# Patient Record
Sex: Female | Born: 1965 | Race: White | Hispanic: No | Marital: Married | Smoking: Never smoker
Health system: Southern US, Community
[De-identification: ages and names within clinical notes are randomized; demographics above are authoritative.]

## PROBLEM LIST (undated history)

## (undated) DIAGNOSIS — M51369 Other intervertebral disc degeneration, lumbar region without mention of lumbar back pain or lower extremity pain: Secondary | ICD-10-CM

## (undated) DIAGNOSIS — D649 Anemia, unspecified: Secondary | ICD-10-CM

## (undated) DIAGNOSIS — F32A Depression, unspecified: Secondary | ICD-10-CM

## (undated) DIAGNOSIS — Z9289 Personal history of other medical treatment: Secondary | ICD-10-CM

## (undated) DIAGNOSIS — C541 Malignant neoplasm of endometrium: Secondary | ICD-10-CM

## (undated) DIAGNOSIS — E785 Hyperlipidemia, unspecified: Secondary | ICD-10-CM

## (undated) DIAGNOSIS — M5136 Other intervertebral disc degeneration, lumbar region: Secondary | ICD-10-CM

## (undated) DIAGNOSIS — E559 Vitamin D deficiency, unspecified: Secondary | ICD-10-CM

## (undated) DIAGNOSIS — F419 Anxiety disorder, unspecified: Secondary | ICD-10-CM

## (undated) DIAGNOSIS — F329 Major depressive disorder, single episode, unspecified: Secondary | ICD-10-CM

## (undated) DIAGNOSIS — N6009 Solitary cyst of unspecified breast: Secondary | ICD-10-CM

## (undated) HISTORY — DX: Hyperlipidemia, unspecified: E78.5

## (undated) HISTORY — DX: Other intervertebral disc degeneration, lumbar region: M51.36

## (undated) HISTORY — DX: Malignant neoplasm of endometrium: C54.1

## (undated) HISTORY — DX: Other intervertebral disc degeneration, lumbar region without mention of lumbar back pain or lower extremity pain: M51.369

## (undated) HISTORY — PX: BACK SURGERY: SHX140

## (undated) HISTORY — DX: Personal history of other medical treatment: Z92.89

## (undated) HISTORY — DX: Solitary cyst of unspecified breast: N60.09

## (undated) HISTORY — DX: Vitamin D deficiency, unspecified: E55.9

## (undated) HISTORY — DX: Major depressive disorder, single episode, unspecified: F32.9

## (undated) HISTORY — DX: Depression, unspecified: F32.A

## (undated) HISTORY — PX: TOTAL VAGINAL HYSTERECTOMY: SHX2548

## (undated) HISTORY — DX: Anxiety disorder, unspecified: F41.9

---

## 2005-04-07 ENCOUNTER — Ambulatory Visit: Payer: Self-pay

## 2008-02-12 ENCOUNTER — Ambulatory Visit: Payer: Self-pay | Admitting: Family Medicine

## 2009-11-14 ENCOUNTER — Ambulatory Visit: Payer: Self-pay | Admitting: Family Medicine

## 2010-01-20 ENCOUNTER — Emergency Department: Payer: Self-pay | Admitting: Emergency Medicine

## 2010-01-22 ENCOUNTER — Ambulatory Visit: Payer: Self-pay | Admitting: Urology

## 2010-02-06 ENCOUNTER — Ambulatory Visit: Payer: Self-pay | Admitting: Family Medicine

## 2010-09-09 ENCOUNTER — Ambulatory Visit: Payer: Self-pay | Admitting: Family Medicine

## 2011-08-03 ENCOUNTER — Emergency Department: Payer: Self-pay | Admitting: Emergency Medicine

## 2011-08-05 ENCOUNTER — Ambulatory Visit: Payer: Self-pay | Admitting: Physician Assistant

## 2011-11-04 DIAGNOSIS — M5417 Radiculopathy, lumbosacral region: Secondary | ICD-10-CM | POA: Insufficient documentation

## 2012-08-15 DIAGNOSIS — Z9289 Personal history of other medical treatment: Secondary | ICD-10-CM

## 2012-08-15 HISTORY — DX: Personal history of other medical treatment: Z92.89

## 2012-08-25 LAB — HM PAP SMEAR

## 2012-09-13 ENCOUNTER — Ambulatory Visit (INDEPENDENT_AMBULATORY_CARE_PROVIDER_SITE_OTHER): Payer: BC Managed Care – PPO | Admitting: Psychology

## 2012-09-13 DIAGNOSIS — F4323 Adjustment disorder with mixed anxiety and depressed mood: Secondary | ICD-10-CM

## 2012-09-14 ENCOUNTER — Encounter: Payer: Self-pay | Admitting: Obstetrics & Gynecology

## 2012-09-14 DIAGNOSIS — Z01419 Encounter for gynecological examination (general) (routine) without abnormal findings: Secondary | ICD-10-CM

## 2012-09-21 ENCOUNTER — Ambulatory Visit: Payer: Self-pay | Admitting: Psychology

## 2012-10-03 ENCOUNTER — Ambulatory Visit: Payer: BC Managed Care – PPO | Admitting: Psychology

## 2012-10-10 ENCOUNTER — Ambulatory Visit: Payer: BC Managed Care – PPO | Admitting: Psychology

## 2012-10-12 ENCOUNTER — Ambulatory Visit: Payer: Self-pay | Admitting: Family Medicine

## 2013-05-22 ENCOUNTER — Ambulatory Visit: Payer: Self-pay | Admitting: Gastroenterology

## 2013-05-23 ENCOUNTER — Ambulatory Visit: Payer: Self-pay | Admitting: Gastroenterology

## 2013-07-12 HISTORY — PX: BACK SURGERY: SHX140

## 2013-07-12 LAB — HM PAP SMEAR: HM PAP: NORMAL

## 2013-12-11 DIAGNOSIS — R0600 Dyspnea, unspecified: Secondary | ICD-10-CM | POA: Insufficient documentation

## 2013-12-11 DIAGNOSIS — Z01810 Encounter for preprocedural cardiovascular examination: Secondary | ICD-10-CM | POA: Insufficient documentation

## 2013-12-11 DIAGNOSIS — R0602 Shortness of breath: Secondary | ICD-10-CM | POA: Insufficient documentation

## 2013-12-14 ENCOUNTER — Ambulatory Visit: Payer: Self-pay | Admitting: Specialist

## 2014-03-12 HISTORY — PX: GASTRIC BYPASS: SHX52

## 2014-04-26 ENCOUNTER — Emergency Department: Payer: Self-pay | Admitting: Emergency Medicine

## 2014-04-26 LAB — COMPREHENSIVE METABOLIC PANEL
ALK PHOS: 81 U/L
Albumin: 3.8 g/dL (ref 3.4–5.0)
Anion Gap: 8 (ref 7–16)
BUN: 17 mg/dL (ref 7–18)
Bilirubin,Total: 0.4 mg/dL (ref 0.2–1.0)
CO2: 24 mmol/L (ref 21–32)
Calcium, Total: 8.8 mg/dL (ref 8.5–10.1)
Chloride: 108 mmol/L — ABNORMAL HIGH (ref 98–107)
Creatinine: 1.05 mg/dL (ref 0.60–1.30)
EGFR (African American): >60
GFR CALC NON AF AMER: 60 — AB
GLUCOSE: 96 mg/dL (ref 65–99)
OSMOLALITY: 281 (ref 275–301)
POTASSIUM: 3.6 mmol/L (ref 3.5–5.1)
SGOT(AST): 27 U/L (ref 15–37)
SGPT (ALT): 31 U/L
Sodium: 140 mmol/L (ref 136–145)
Total Protein: 8.2 g/dL (ref 6.4–8.2)

## 2014-04-26 LAB — CBC
HCT: 43.3 % (ref 35.0–47.0)
HGB: 13.4 g/dL (ref 12.0–16.0)
MCH: 25.4 pg — AB (ref 26.0–34.0)
MCHC: 30.8 g/dL — AB (ref 32.0–36.0)
MCV: 82 fL (ref 80–100)
Platelet: 265 10*3/uL (ref 150–440)
RBC: 5.26 10*6/uL — AB (ref 3.80–5.20)
RDW: 16.1 % — AB (ref 11.5–14.5)
WBC: 11.6 10*3/uL — ABNORMAL HIGH (ref 3.6–11.0)

## 2014-04-26 LAB — URINALYSIS, COMPLETE
Bilirubin,UR: NEGATIVE
Glucose,UR: NEGATIVE mg/dL (ref 0–75)
Nitrite: NEGATIVE
Ph: 5 (ref 4.5–8.0)
Protein: 30
SPECIFIC GRAVITY: 1.031 (ref 1.003–1.030)
Squamous Epithelial: 40

## 2014-04-26 LAB — LIPASE, BLOOD: LIPASE: 270 U/L (ref 73–393)

## 2014-04-28 ENCOUNTER — Emergency Department: Payer: Self-pay | Admitting: Emergency Medicine

## 2014-04-28 LAB — CBC
HCT: 37.2 % (ref 35.0–47.0)
HGB: 11.7 g/dL — AB (ref 12.0–16.0)
MCH: 26.1 pg (ref 26.0–34.0)
MCHC: 31.5 g/dL — AB (ref 32.0–36.0)
MCV: 83 fL (ref 80–100)
Platelet: 219 10*3/uL (ref 150–440)
RBC: 4.49 10*6/uL (ref 3.80–5.20)
RDW: 16.2 % — ABNORMAL HIGH (ref 11.5–14.5)
WBC: 10 10*3/uL (ref 3.6–11.0)

## 2014-04-28 LAB — URINALYSIS, COMPLETE
BACTERIA: NONE SEEN
Glucose,UR: NEGATIVE mg/dL (ref 0–75)
Leukocyte Esterase: NEGATIVE
Nitrite: NEGATIVE
PH: 5 (ref 4.5–8.0)
RBC,UR: 15 /HPF (ref 0–5)
SPECIFIC GRAVITY: 1.044 (ref 1.003–1.030)
Squamous Epithelial: 21
WBC UR: 5 /HPF (ref 0–5)

## 2014-04-28 LAB — LIPASE, BLOOD: Lipase: 225 U/L (ref 73–393)

## 2014-04-28 LAB — COMPREHENSIVE METABOLIC PANEL
Albumin: 3 g/dL — ABNORMAL LOW (ref 3.4–5.0)
Alkaline Phosphatase: 71 U/L
Anion Gap: 12 (ref 7–16)
BUN: 15 mg/dL (ref 7–18)
Bilirubin,Total: 0.5 mg/dL (ref 0.2–1.0)
CALCIUM: 8.3 mg/dL — AB (ref 8.5–10.1)
CO2: 23 mmol/L (ref 21–32)
Chloride: 104 mmol/L (ref 98–107)
Creatinine: 1.03 mg/dL (ref 0.60–1.30)
GLUCOSE: 96 mg/dL (ref 65–99)
OSMOLALITY: 278 (ref 275–301)
Potassium: 3.5 mmol/L (ref 3.5–5.1)
SGOT(AST): 19 U/L (ref 15–37)
SGPT (ALT): 23 U/L
SODIUM: 139 mmol/L (ref 136–145)
Total Protein: 7.2 g/dL (ref 6.4–8.2)

## 2014-04-28 LAB — URINE CULTURE

## 2014-04-29 DIAGNOSIS — N200 Calculus of kidney: Secondary | ICD-10-CM | POA: Insufficient documentation

## 2014-04-30 LAB — URINE CULTURE

## 2014-07-12 HISTORY — PX: BREAST BIOPSY: SHX20

## 2014-07-12 LAB — HM MAMMOGRAPHY: HM Mammogram: NORMAL (ref 0–4)

## 2014-09-24 LAB — HM MAMMOGRAPHY

## 2014-11-01 NOTE — Op Note (Signed)
PATIENT NAME:  Susan Strickland, Susan Strickland MR#:  [id number] DATE OF BIRTH:  25-Jan-1966  DATE OF PROCEDURE:  05/23/2013  REFERRING PHYSICIAN:  Dr. Marcie Bal Dear  PERFORMING PHYSICIAN:  Lucilla Lame, MD  PROCEDURE:  Impedance pH monitoring.  PROCEDURE IN DETAIL:  The pH catheter impedance probe was placed across the esophagus, and monitoring was commenced for 24 hours. The patient kept a log of her symptoms, and the information was then returned the next day, and the values were analyzed.    RESULTS:  [name] acid exposure of her pH showed esophageal pH at 24 episodes that lasted greater than 5 seconds with the longest episode in the upright position being 32 seconds, and the recumbent episodes lasted 18.8 seconds. The gastric pH throughout the procedure showed a total of 76.1% of the time with the pH less than 4. The composite score showed the patient to have total episodes of 25.1 with a DeMeester score of 3.1, which is below the normal threshold of less than 14.7. The reflux episode activity showed 81 reflux events in the upright position with 20 recumbent, for a total of 101, which is abnormally high at greater than 48. [name] nonacid episodes of reflux were 43 compared to 38 of acid. [name] symptom correlation for the reflux showed that she had 3 episodes of chest pain; two were acid related and one was unrelated. The patient had nausea of 2 occurrences with two being nonacid related, and she had the lump in the throat feeling 4 times; two of them acid related, one nonacid related. The reflux symptom index for impedance showed the chest pain correlated to 76% with the lump in throat 50%, but SAP showed chest pain to be 80% and the lump in her throat to be 97%. This study shows that the patient is having episodes of nonacid reflux that do correlate with her chest pain and a lump in her throat with the patient reported to be on acid suppression, but her gastric pH was below 4 for a significant  amount of time, equaling 76.1% of the gastric pH.     ____________________________ Lucilla Lame, MD dw:ms D: 05/31/2013 20:23:48 ET T: 05/31/2013 23:35:03 ET JOB#: [id number]  cc: Lucilla Lame, MD, <Dictator> Lucilla Lame MD ELECTRONICALLY SIGNED 06/05/2013 9:29

## 2014-11-01 NOTE — Op Note (Signed)
PATIENT NAME:  Susan Strickland, Susan Strickland MR#:  [id number] DATE OF BIRTH:  April 09, 1966  DATE OF PROCEDURE:  05/23/2013  REFERRING PHYSICIAN:  Dr. Marcie Bal Dear  INTERPRETING PHYSICIAN:  Dr. Lucilla Lame   PROCEDURE:  Esophageal manometry was done.   INDICATION:  Chest pain and a feeling of a lump in her throat.   SUMMARY:  [name] lower esophageal sphincter pressure was 19.6, which was within the normal limits. The range is between 10 and 45. [name] complete bolus transit was 64%, which occurred in 7 out of 11 swallows of liquid. This is lower than the prescribed 79%, and the total time for bolus transit for the completed bolus transit episodes was 11.8, which was normal. The manometry showed the patient's DCI to be average to be 1566. The CFV was 2. [name] DL was 10.4 sec. The IRP was 17, and the patient's TB in centimeters was average of 4. The swallowing of viscous liquids showed 70% complete bolus transfer of the measured 10 swallows with normal being greater than 69. [name] transit from the bolus was 12.7, which is less than 13, which is determined to be normal. The upper esophageal sphincter pressure was 116 mmHg, where normal is 30 to 118. The interpretation of this study showed the patient to have an esophageal manometry with normal upper and lower esophageal sphincter pressures and somewhat decreased complete bolus transit of 64% for liquids, but otherwise normal. There were no signs of esophageal dysmotility or hyperactivity. There were also no signs of high pressures to indicate nutcracker's esophagus/diffuse esophageal spasms or hypoactive esophagus.     ____________________________ Lucilla Lame, MD dw:ms D: 05/31/2013 20:32:43 ET T: 05/31/2013 23:47:13 ET JOB#: [id number]  cc: Lucilla Lame, MD, <Dictator> Lucilla Lame MD ELECTRONICALLY SIGNED 06/05/2013 9:30

## 2014-12-23 DIAGNOSIS — M5136 Other intervertebral disc degeneration, lumbar region: Secondary | ICD-10-CM | POA: Insufficient documentation

## 2015-10-21 ENCOUNTER — Encounter: Payer: Self-pay | Admitting: Family Medicine

## 2015-10-21 ENCOUNTER — Ambulatory Visit (INDEPENDENT_AMBULATORY_CARE_PROVIDER_SITE_OTHER): Payer: Self-pay | Admitting: Family Medicine

## 2015-10-21 VITALS — BP 119/81 | HR 91 | Temp 98.2°F | Resp 16 | Ht 60.0 in | Wt 210.0 lb

## 2015-10-21 DIAGNOSIS — E559 Vitamin D deficiency, unspecified: Secondary | ICD-10-CM

## 2015-10-21 DIAGNOSIS — F329 Major depressive disorder, single episode, unspecified: Secondary | ICD-10-CM

## 2015-10-21 DIAGNOSIS — M51369 Other intervertebral disc degeneration, lumbar region without mention of lumbar back pain or lower extremity pain: Secondary | ICD-10-CM

## 2015-10-21 DIAGNOSIS — M5136 Other intervertebral disc degeneration, lumbar region: Secondary | ICD-10-CM

## 2015-10-21 DIAGNOSIS — F32A Depression, unspecified: Secondary | ICD-10-CM

## 2015-10-21 DIAGNOSIS — F3281 Premenstrual dysphoric disorder: Secondary | ICD-10-CM | POA: Insufficient documentation

## 2015-10-21 MED ORDER — SERTRALINE HCL 50 MG PO TABS: 50.0000 mg | ORAL_TABLET | Freq: Every day | ORAL | Status: DC

## 2015-10-21 NOTE — Progress Notes (Signed)
Subjective:    Patient ID: Susan Strickland, female    DOB: 08-Apr-1966, 50 y.o.   MRN: [id number]  HPI: Susan Strickland is a 50 y.o. female presenting on 10/21/2015 for Establish Care   HPI  Pt presents to establish care today. Previous care provider was [facility].  It has been 1 year since Her last PCP visit. Records from previous provider will be requested and reviewed. Current medical problems include:  Degenerative disc disease: Previous surgery. Disc dissection. Sees [facility]. Is considering further surgery. Getting steroid injections in her back.  Dr. Darnell Level- gastric sleeve Kidney stones in past. No recent issues.  Vitamin D deficiency- taking 1000mg  once daily. Depression/Anxiety: Taking 50mg  zoloft daily. Has been on about 1 year. Feels it is working work. Previously taking 100mg .   Practices maximizes living- holistic eating plan and adjustment.    Health maintenance:  Last pap smear: 2 year ago. Normal in the past. Mammogram- 2 years ago. Mom had breast cancer diagnosed at age 48.  TDAP: Within 10 years.  No family history of colon cancer.  Still having periods- regular cycles.    Past Medical History  Diagnosis Date  . Anxiety    Past Surgical History  Procedure Laterality Date  . Gastric bypass  03/2014    gastric sleeve.   . Back surgery      Social History   Social History  . Marital Status: Married    Spouse Name: N/A  . Number of Children: N/A  . Years of Education: N/A   Occupational History  . Not on file.   Social History Main Topics  . Smoking status: Never Smoker   . Smokeless tobacco: Not on file  . Alcohol Use: No  . Drug Use: No  . Sexual Activity: Not on file   Other Topics Concern  . Not on file   Social History Narrative  . No narrative on file   Family History  Problem Relation Age of Onset  . Hypertension Father   . Kidney disease Father   . Diabetes Father   . Heart disease Father    No current outpatient  prescriptions on file prior to visit.   No current facility-administered medications on file prior to visit.    Review of Systems  Constitutional: Negative for fever and chills.  HENT: Negative.   Respiratory: Negative for cough, chest tightness and wheezing.   Cardiovascular: Negative for chest pain and leg swelling.  Gastrointestinal: Negative for nausea, vomiting, abdominal pain, diarrhea and constipation.  Endocrine: Negative.  Negative for cold intolerance, heat intolerance, polydipsia, polyphagia and polyuria.  Genitourinary: Negative for dysuria and difficulty urinating.  Musculoskeletal: Negative.   Neurological: Negative for dizziness, light-headedness and numbness.  Psychiatric/Behavioral: Negative.  Negative for suicidal ideas and behavioral problems. The patient is not nervous/anxious.    Per HPI unless specifically indicated above     Objective:    BP 119/81 mmHg  Pulse 91  Temp(Src) 98.2 F (36.8 C) (Oral)  Resp 16  Ht 5' (1.524 m)  Wt 210 lb (95.255 kg)  BMI 41.01 kg/m2  LMP 10/20/2015  Wt Readings from Last 3 Encounters:  10/21/15 210 lb (95.255 kg)    Depression screen [facility] 2/9 10/21/2015 10/21/2015  Decreased Interest 0 0  Down, Depressed, Hopeless 1 0  PHQ - 2 Score 1 0  Altered sleeping 0 -  Tired, decreased energy 0 -  Change in appetite 0 -  Feeling bad or failure about  yourself  0 -  Trouble concentrating 0 -  Moving slowly or fidgety/restless 0 -  Suicidal thoughts 0 -  PHQ-9 Score 1 -  Difficult doing work/chores Not difficult at all -     Physical Exam  Constitutional: She is oriented to person, place, and time. She appears well-developed and well-nourished.  HENT:  Head: Normocephalic and atraumatic.  Neck: Neck supple.  Cardiovascular: Normal rate, regular rhythm and normal heart sounds.  Exam reveals no gallop and no friction rub.   No murmur heard. Pulmonary/Chest: Effort normal and breath sounds normal. She has no wheezes. She  exhibits no tenderness.  Abdominal: Soft. Normal appearance and bowel sounds are normal. She exhibits no distension and no mass. There is no tenderness. There is no rebound and no guarding.  Musculoskeletal: Normal range of motion. She exhibits no edema or tenderness.  Lymphadenopathy:    She has no cervical adenopathy.  Neurological: She is alert and oriented to person, place, and time.  Skin: Skin is warm and dry.  Psychiatric: She has a normal mood and affect. Her behavior is normal. Judgment and thought content normal.   Results for orders placed or performed in visit on 10/21/15  HM MAMMOGRAPHY  Result Value Ref Range   HM Mammogram Self Reported Normal 0-4 Bi-Rad, Self Reported Normal  HM PAP SMEAR  Result Value Ref Range   HM Pap smear normal        Assessment & Plan:   Problem List Items Addressed This Visit      Musculoskeletal and Integument   Degeneration of intervertebral disc of lumbar region    Managed by [facility].         Other   Depression - Primary    Renewed Zoloft today. Pt appears to be well controlled on current medications. Recheck in 6 mos.       Relevant Medications   sertraline (ZOLOFT) 50 MG tablet   Vitamin D deficiency    Labs were checked by Dr. Darnell Level. Will request labwork and recheck Vitamin D if needed.          Meds ordered this encounter  Medications  . DISCONTD: sertraline (ZOLOFT) 50 MG tablet    Sig: Take 100 mg by mouth.  . Ascorbic Acid (VITAMIN C) 1000 MG tablet    Sig: Take by mouth.  . DISCONTD: Cyanocobalamin (B-12 PO)    Sig: Take by mouth.  . Cholecalciferol (VITAMIN D-1000 MAX ST) 1000 units tablet    Sig: Take by mouth.  . Multiple Vitamin (MULTI-VITAMINS) TABS    Sig: Take by mouth.  . sertraline (ZOLOFT) 50 MG tablet    Sig: Take 1 tablet (50 mg total) by mouth daily.    Dispense:  90 tablet    Refill:  3    Order Specific Question:  Supervising Provider    Answer:  Arlis Porta [id number]       Follow up plan: Return in about 6 months (around 04/21/2016) for depression. Marland Kitchen

## 2015-10-21 NOTE — Assessment & Plan Note (Signed)
Labs were checked by Dr. Darnell Level. Will request labwork and recheck Vitamin D if needed.

## 2015-10-21 NOTE — Patient Instructions (Addendum)
I have refilled your zoloft for you. Please keep taking as directed.

## 2015-10-21 NOTE — Assessment & Plan Note (Signed)
Renewed Zoloft today. Pt appears to be well controlled on current medications. Recheck in 6 mos.

## 2015-10-21 NOTE — Assessment & Plan Note (Signed)
Managed by [facility].

## 2016-02-20 ENCOUNTER — Ambulatory Visit (INDEPENDENT_AMBULATORY_CARE_PROVIDER_SITE_OTHER): Payer: BC Managed Care – PPO | Admitting: Family Medicine

## 2016-02-20 VITALS — BP 122/72 | HR 93 | Temp 98.0°F | Resp 16 | Ht 60.0 in | Wt 196.0 lb

## 2016-02-20 DIAGNOSIS — F418 Other specified anxiety disorders: Secondary | ICD-10-CM | POA: Diagnosis not present

## 2016-02-20 DIAGNOSIS — F32A Depression, unspecified: Secondary | ICD-10-CM

## 2016-02-20 DIAGNOSIS — F329 Major depressive disorder, single episode, unspecified: Secondary | ICD-10-CM

## 2016-02-20 MED ORDER — SERTRALINE HCL 50 MG PO TABS: 75.0000 mg | ORAL_TABLET | Freq: Every day | ORAL | 3 refills | Status: DC

## 2016-02-20 MED ORDER — LORAZEPAM 0.5 MG PO TABS: 0.5000 mg | ORAL_TABLET | Freq: Two times a day (BID) | ORAL | 0 refills | Status: DC | PRN

## 2016-02-20 NOTE — Progress Notes (Signed)
Subjective:    Patient ID: Susan Strickland, female    DOB: 02/11/1966, 50 y.o.   MRN: [id number]  HPI: Susan Strickland is a 50 y.o. female presenting on 02/20/2016 for Anxiety (getting worst)   HPI  Pt presents for anxiety. Had decreased Zoloft to 50mg  from 75 daily. She thought she was doing well. Work stressors have changed.  Having more anxiety. Possible toxic work environment. Had a run in with a coworker that has made her feel very anxious.   Past Medical History:  Diagnosis Date  . Anxiety     Current Outpatient Prescriptions on File Prior to Visit  Medication Sig  . Ascorbic Acid (VITAMIN C) 1000 MG tablet Take by mouth.  . Cholecalciferol (VITAMIN D-1000 MAX ST) 1000 units tablet Take by mouth.  . Multiple Vitamin (MULTI-VITAMINS) TABS Take by mouth.   No current facility-administered medications on file prior to visit.     Review of Systems Per HPI unless specifically indicated above Depression screen [facility] 2/9 02/20/2016 10/21/2015 10/21/2015  Decreased Interest 0 0 0  Down, Depressed, Hopeless 0 1 0  PHQ - 2 Score 0 1 0  Altered sleeping 0 0 -  Tired, decreased energy 1 0 -  Change in appetite 1 0 -  Feeling bad or failure about yourself  1 0 -  Trouble concentrating 0 0 -  Moving slowly or fidgety/restless 0 0 -  Suicidal thoughts 0 0 -  PHQ-9 Score 3 1 -  Difficult doing work/chores Somewhat difficult Not difficult at all -   GAD 7 : Generalized Anxiety Score 02/20/2016 10/21/2015  Nervous, Anxious, on Edge 1 2  Control/stop worrying 1 0  Worry too much - different things 0 0  Trouble relaxing 0 1  Restless 1 1  Easily annoyed or irritable 0 1  Afraid - awful might happen 1 0  Total GAD 7 Score 4 5  Anxiety Difficulty Somewhat difficult Not difficult at all        Objective:    BP 122/72 (BP Location: Left Arm, Patient Position: Sitting, Cuff Size: Normal)   Pulse 93   Temp 98 F (36.7 C) (Oral)   Resp 16   Ht 5' (1.524 m)   Wt 196 lb (88.9 kg)    BMI 38.28 kg/m   Wt Readings from Last 3 Encounters:  02/20/16 196 lb (88.9 kg)  10/21/15 210 lb (95.3 kg)    Physical Exam  Constitutional: She appears well-developed and well-nourished. No distress.  HENT:  Head: Normocephalic and atraumatic.  Eyes: EOM are normal. Pupils are equal, round, and reactive to light.  Neck: Normal range of motion. Neck supple.  Skin: Skin is warm and dry. No rash noted. She is not diaphoretic. No erythema.  Psychiatric: Her speech is normal and behavior is normal. Thought content normal. Her mood appears anxious. Cognition and memory are normal.  Appropriate tearful when talking about stressors.    Results for orders placed or performed in visit on 10/21/15  HM MAMMOGRAPHY  Result Value Ref Range   HM Mammogram Self Reported Normal 0-4 Bi-Rad, Self Reported Normal  HM PAP SMEAR  Result Value Ref Range   HM Pap smear normal        Assessment & Plan:   Problem List Items Addressed This Visit      Other   Depression - Primary    Will increase Zoloft back to 75mg  once daily. Recheck 4 weeks.  Relevant Medications   sertraline (ZOLOFT) 50 MG tablet   LORazepam (ATIVAN) 0.5 MG tablet    Other Visit Diagnoses    Situational anxiety       Ativan PRN for panic symptoms and severe anxiety. Discussed mediation and how to eliminate work stressors.    Relevant Medications   sertraline (ZOLOFT) 50 MG tablet   LORazepam (ATIVAN) 0.5 MG tablet      Meds ordered this encounter  Medications  . LOMAIRA 8 MG TABS    Refill:  0  . sertraline (ZOLOFT) 50 MG tablet    Sig: Take 1.5 tablets (75 mg total) by mouth daily.    Dispense:  135 tablet    Refill:  3    Order Specific Question:   Supervising Provider    Answer:   Arlis Porta [phone]  . LORazepam (ATIVAN) 0.5 MG tablet    Sig: Take 1 tablet (0.5 mg total) by mouth 2 (two) times daily as needed for anxiety.    Dispense:  30 tablet    Refill:  0    Order Specific Question:    Supervising Provider    Answer:   Arlis Porta [id number]      Follow up plan: Return in about 4 weeks (around 03/19/2016) for Anxiety/ depression. Marland Kitchen

## 2016-02-20 NOTE — Patient Instructions (Signed)
Let try increasing back to 75mg  once daily for your Zoloft. Please let me know if you feel the dose is not helping.  You can take Ativan (Lorazepam) as needed for anxiety. This medication may make you drowsy. Try taking 1/2 tablet at first and do not drive until you know how it will make you feel.

## 2016-02-20 NOTE — Assessment & Plan Note (Signed)
Will increase Zoloft back to 75mg  once daily. Recheck 4 weeks.

## 2016-06-09 ENCOUNTER — Encounter: Payer: Self-pay | Admitting: Family Medicine

## 2016-06-09 ENCOUNTER — Ambulatory Visit (INDEPENDENT_AMBULATORY_CARE_PROVIDER_SITE_OTHER): Payer: BC Managed Care – PPO | Admitting: Family Medicine

## 2016-06-09 VITALS — BP 106/62 | HR 80 | Temp 97.3°F | Resp 16 | Ht 60.0 in | Wt 193.0 lb

## 2016-06-09 DIAGNOSIS — M546 Pain in thoracic spine: Secondary | ICD-10-CM | POA: Diagnosis not present

## 2016-06-09 MED ORDER — BACLOFEN 10 MG PO TABS: 5.0000 mg | ORAL_TABLET | Freq: Three times a day (TID) | ORAL | 1 refills | Status: DC | PRN

## 2016-06-09 NOTE — Progress Notes (Signed)
Subjective:    Patient ID: Susan Strickland, female    DOB: May 16, 1966, 50 y.o.   MRN: [id number]  Susan Strickland is a 50 y.o. female presenting on 06/09/2016 for Back Pain (onset 3 days getting worst)  Patient presents for a same day appointment.  HPI  Acute Thoracic BACK PAIN - Reports symptoms started about 3-4 days ago without known inciting injury but does admit to working out at core building at gym in mornings, she recently took a few weeks off on vacation and did have some pains at that time, but gradual improvement and then worse again. Today seems to be worsening. Describes pain as severe middle back pain bilaterally, tightening spasms of muscles 6-8 out of 10 with intermittent episodes lasting around 30-45 min sometimes can last longer if increased activity. Worse with lifting bending forward and turning / rotation. No pain radiating to legs. - Taking Tylenol 325mg  x 3 tabs q 6-8 hours (with some moderate relief), not taking NSAIDs, s/p gastric sleeve - Tried back brace with some relief. Worse with bra strap. Has not tried heating pad at night, ice - History of lower lumbar L5-S1 OA/DJD, with prior back surgery, today pain feels very different from her chronic low back pain - Admits difficulty sleeping due to pain last night - Denies any fevers/chills, numbness, tingling, weakness, loss of control bladder/bowel incontinence or retention, unintentional wt loss, night sweats   Social History  Substance Use Topics  . Smoking status: Never Smoker  . Smokeless tobacco: Not on file  . Alcohol use No    Review of Systems Per HPI unless specifically indicated above     Objective:    BP 106/62 (BP Location: Left Arm, Patient Position: Standing, Cuff Size: Normal)   Pulse 80   Temp 97.3 F (36.3 C) (Oral)   Resp 16   Ht 5' (1.524 m)   Wt 193 lb (87.5 kg)   BMI 37.69 kg/m   Wt Readings from Last 3 Encounters:  06/09/16 193 lb (87.5 kg)  02/20/16 196 lb (88.9 kg)    10/21/15 210 lb (95.3 kg)    Physical Exam  Constitutional: She appears well-developed and well-nourished. No distress.  Well-appearing, comfortable, cooperative  HENT:  Head: Normocephalic and atraumatic.  Mouth/Throat: Oropharynx is clear and moist.  Cardiovascular: Normal rate and intact distal pulses.   Pulmonary/Chest: Effort normal.  Musculoskeletal:  Back Inspection: Normal appearance, no spinal deformity, symmetrical. Palpation: Mild to moderate tenderness over lower thoracic spinous processes and Bilateral paraspinal muscles with significant hypertonicity/spasm. ROM: Limited forward flex active ROM due to pain and spasm, but intact extension, limited rotation L/R without discomfort Special Testing: Seated SLR negative for radicular pain bilaterally. Seated facet load test positive bilateral R>L back pain Strength: Bilateral hip flex/ext 5/5, knee flex/ext 5/5, ankle dorsiflex/plantarflex 5/5 Neurovascular: intact distal sensation to light touch  Neurological: She is alert.  Skin: Skin is warm and dry. No rash noted. She is not diaphoretic.  Nursing note and vitals reviewed.      Assessment & Plan:   Problem List Items Addressed This Visit    None    Visit Diagnoses    Acute bilateral thoracic back pain    -  Primary  Acute on chronic bilateral lower thoracic back pain without associated sciatica. Suspect likely due to muscle spasm/strain with gym core exercise training without known injury or trauma. In setting of known chronic LBP with DJD and prior L5-S1 surgery. - No red flag  symptoms. Negative SLR for radiculopathy - Inadequate conservative therapy, is improved by tylenol, rest, back brace  Plan: 1. Start muscle relaxant with Baclofen 10mg  tabs - take 5-10mg  up to TID PRN, titrate up as tolerated 2. Start anti-inflammatory regularly 1-2 weeks, take current NSAID that she has at home, no new rx 3. Start regular Tylenol dosing 1000mg  TID 1-2 weeks then PRN 4.  Encouraged use of heating pad 1-2x daily for now then PRN, may use copper back brace 5. Note for work today, she declined further time off, advised relative rest, activity modification, can walk but no upper body lifting for 2 weeks or more let pain be guide 6. RTC 1 month, re-evaluation. If not improved consider X-ray imaging given >6 weeks. Consider trial of PT for strengthening.     Relevant Medications   baclofen (LIORESAL) 10 MG tablet      Meds ordered this encounter  Medications  . baclofen (LIORESAL) 10 MG tablet    Sig: Take 0.5-1 tablets (5-10 mg total) by mouth 3 (three) times daily as needed for muscle spasms.    Dispense:  60 each    Refill:  1      Follow up plan: Return in about 4 weeks (around 07/07/2016), or if symptoms worsen or fail to improve, for back pain.  Susan Putnam, DO [facility] 06/09/2016, 9:43 AM

## 2016-06-09 NOTE — Patient Instructions (Signed)
Thank you for coming in to clinic today.  1. For your Back Pain - I think that this is due to Muscle Spasms or strain. 2. Start Baclofen (Lioresal) 10mg  tablets - cut in half for 5mg  at night for muscle relaxant - may make you sedated or sleepy (be careful driving or working on this) if tolerated you can take every 8 hours, half or whole tab 3. Recommend to start taking Tylenol Extra Strength 500mg  tabs - take 1 to 2 tabs per dose (max 1000mg ) every 6-8 hours for pain (take regularly, don't skip a dose for next 1-2 weeks), max 24 hour daily dose is 6 tablets or 3000mg . In the future you can repeat the same everyday Tylenol course for 1-2 weeks at a time. - This is safe to take with anti-inflammatory medicines (Ibuprofen, Advil, Naproxen, Aleve, Meloxicam, Mobic), whichever anti-inflammatory you are taking safely at home, this is fine to continue with the tylenol and baclofen, I do prefer regular dosing with food every day for 1-2 weeks if it does not bother your gastric sleeve or you can do as needed 4. Recommend to start using heating pad on your lower back 1-2x daily for few weeks  This pain may take weeks to months to fully resolve, but hopefully it will respond to the medicine initially. All back injuries (small or serious) are slow to heal since we use our back muscles every day. Be careful with turning, twisting, lifting, sitting / standing for prolonged periods, and avoid re-injury.  If your symptoms significantly worsen with more pain, or new symptoms with weakness in one or both legs, new or different shooting leg pains, numbness in legs or groin, loss of control or retention of urine or bowel movements, please call back for advice and you may need to go directly to the Emergency Department.  Activity modification, avoid upper body lifting exercises and running for 2 weeks then let pain be your guide if it hurts don't do it. Stick to walking and light activity for now.  Please schedule a  follow-up appointment with Dr. Parks Ranger in 2-4 weeks for back pain follow-up if not improving  If you have any other questions or concerns, please feel free to call the clinic or send a message through [place]. You may also schedule an earlier appointment if necessary.  Nobie Putnam, DO [place]

## 2016-11-29 ENCOUNTER — Encounter: Payer: Self-pay | Admitting: Nurse Practitioner

## 2016-11-29 ENCOUNTER — Ambulatory Visit (INDEPENDENT_AMBULATORY_CARE_PROVIDER_SITE_OTHER): Payer: BC Managed Care – PPO | Admitting: Nurse Practitioner

## 2016-11-29 VITALS — BP 113/81 | HR 101 | Temp 98.0°F | Ht 60.0 in | Wt 192.0 lb

## 2016-11-29 DIAGNOSIS — J301 Allergic rhinitis due to pollen: Secondary | ICD-10-CM | POA: Diagnosis not present

## 2016-11-29 DIAGNOSIS — J069 Acute upper respiratory infection, unspecified: Secondary | ICD-10-CM

## 2016-11-29 DIAGNOSIS — J22 Unspecified acute lower respiratory infection: Secondary | ICD-10-CM | POA: Diagnosis not present

## 2016-11-29 MED ORDER — LORATADINE 10 MG PO TABS: 10.0000 mg | ORAL_TABLET | Freq: Every day | ORAL | 2 refills | Status: DC

## 2016-11-29 MED ORDER — BENZONATATE 100 MG PO CAPS: 100.0000 mg | ORAL_CAPSULE | Freq: Three times a day (TID) | ORAL | 0 refills | Status: DC | PRN

## 2016-11-29 MED ORDER — DOXYCYCLINE HYCLATE 100 MG PO TABS: 100.0000 mg | ORAL_TABLET | Freq: Two times a day (BID) | ORAL | 0 refills | Status: DC

## 2016-11-29 NOTE — Progress Notes (Signed)
I have reviewed this encounter including the documentation in this note and/or discussed this patient with the provider, Cassell Smiles, AGPCNP-BC. I am certifying that I agree with the content of this note as supervising physician.  Nobie Putnam, [facility] 11/29/2016, 2:51 PM

## 2016-11-29 NOTE — Progress Notes (Signed)
Subjective:    Patient ID: Susan Strickland, female    DOB: 10-16-1965, 51 y.o.   MRN: [id number]  Susan Strickland is a 51 y.o. female presenting on 11/29/2016 for Cough (chest congestion,SOB, wheezing and Low-grade fever x 8 days)   HPI  URI Symptoms Onset 7-8 days ago.  This is second round.  Had similar symptoms 3 weeks ago, got better x 1 week, then worsened again and is not improving. - Symptoms include shortness of breath, wheezing, fatigue, cough with phlegm, headache, sinus pressure, possible fever (not measured), rhinorrhea, pnd, and decreased appetite.  She notes a "Bad, deep cough" that is causingsome stress incontinence.  3 pads/day.  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 - Denies ear pressure/pain/fullness, jaw pain. - Taking delsym and Mucinex-D behind the counter.    She has had sick contacts at school.  Social History  Substance Use Topics  . Smoking status: Never Smoker  . Smokeless tobacco: Never Used  . Alcohol use No    Review of Systems Per HPI unless specifically indicated above     Objective:    BP 113/81   Pulse (!) 101   Temp 98 F (36.7 C) (Oral)   Ht 5' (1.524 m)   Wt 192 lb (87.1 kg)   SpO2 97%   BMI 37.50 kg/m    Wt Readings from Last 3 Encounters:  11/29/16 192 lb (87.1 kg)  06/09/16 193 lb (87.5 kg)  02/20/16 196 lb (88.9 kg)    Physical Exam  Constitutional: She is oriented to person, place, and time. She appears well-developed and well-nourished. No distress.  HENT:  Head: Normocephalic and atraumatic.  Right Ear: Hearing, tympanic membrane, external ear and ear canal normal.  Left Ear: Hearing, tympanic membrane, external ear and ear canal normal.  Nose: Mucosal edema and rhinorrhea present. Right sinus exhibits maxillary sinus tenderness and frontal sinus tenderness. Left sinus exhibits maxillary sinus tenderness and frontal sinus tenderness.  Mouth/Throat: Uvula is  midline and mucous membranes are normal. Oropharyngeal exudate and posterior oropharyngeal edema present. No posterior oropharyngeal erythema or tonsillar abscesses.  cobblestoning  Eyes: Conjunctivae are normal. Pupils are equal, round, and reactive to light.  Neck: Normal range of motion. Neck supple. No JVD present. No tracheal deviation present.  Cardiovascular: Normal rate, regular rhythm, normal heart sounds and intact distal pulses.   Pulmonary/Chest: Effort normal. She has no decreased breath sounds. She has wheezes in the right upper field and the right middle field. She has no rhonchi. She has no rales.      Lymphadenopathy:    She has cervical adenopathy.  Neurological: She is alert and oriented to person, place, and time.  Skin: Skin is warm and dry.  Psychiatric: She has a normal mood and affect. Her behavior is normal. Judgment and thought content normal.       Assessment & Plan:   Problem List Items Addressed This Visit    None    Visit Diagnoses    Seasonal allergic rhinitis due to pollen    -  Primary Pt with seasonal allergies.  Not taking medication.  Plan: 1. Start loratadine 10 mg once daily for management of mucous production associated with pollen allergy.   Relevant Medications   loratadine (CLARITIN) 10 MG tablet   Upper respiratory infection, acute    - Primary Acute illness. Fever responsive to NSAIDs and tylenol.  Symptoms worsening with second infectious period  in last 3 weeks. Consistent with bacterial illness x 7-8 days and focal infection of frontal and maxillary sinuses.  Plan: 1. Start doxycycline 100 mg bid x 10 days.  Take probiotic to aid gut flora. - Start anti-histamine Loratadine 10mg  daily - Continue delsym and Mucinex-D as needed. - Start tessalon perles 100 mg every 8 hours as needed for cough. 2. Supportive care with nasal saline, warm herbal tea with honey, 3. Improve hydration 4. Tylenol / Motrin PRN fevers 5. Return criteria  given. Call if not improving in 3-5 days.   Relevant Medications   pseudoephedrine-guaifenesin (MUCINEX D) 60-600 MG 12 hr tablet   benzonatate (TESSALON) 100 MG capsule   doxycycline (VIBRA-TABS) 100 MG tablet   loratadine (CLARITIN) 10 MG tablet   Unspecified acute lower respiratory infection     Probable acute lower respiratory infection as indicated by positive egophony, shortness of breath, wheezing, and deep productive cough.  Plan: 1. Discussed utility of CXR today with patient.  With possiblity of both upper and lower respiratory tract infections, antibiotic treatment would not change if imaging obtained today.  Pt decides to decline CXR. 2. If symptoms worsen or persist, obtain CXR to evaluate. 3. START doxycycline 100 mg bid x 10 days   Relevant Medications   pseudoephedrine-guaifenesin (MUCINEX D) 60-600 MG 12 hr tablet   benzonatate (TESSALON) 100 MG capsule   doxycycline (VIBRA-TABS) 100 MG tablet      Meds ordered this encounter  Medications  . pseudoephedrine-guaifenesin (MUCINEX D) 60-600 MG 12 hr tablet    Sig: Take 1 tablet by mouth every 12 (twelve) hours.  . benzonatate (TESSALON) 100 MG capsule    Sig: Take 1 capsule (100 mg total) by mouth 3 (three) times daily as needed for cough.    Dispense:  30 capsule    Refill:  0    Order Specific Question:   Supervising Provider    Answer:   Olin Hauser [[id number]]  . doxycycline (VIBRA-TABS) 100 MG tablet    Sig: Take 1 tablet (100 mg total) by mouth 2 (two) times daily.    Dispense:  20 tablet    Refill:  0    Order Specific Question:   Supervising Provider    Answer:   Olin Hauser [[id number]]  . loratadine (CLARITIN) 10 MG tablet    Sig: Take 1 tablet (10 mg total) by mouth daily.    Dispense:  30 tablet    Refill:  2    Order Specific Question:   Supervising Provider    Answer:   Olin Hauser [[id number]]      Follow up plan: Return 3-5 days if symptoms worsen or fail to  improve.   Cassell Smiles, DNP, AGPCNP-BC Adult Gerontology Primary Care Nurse Practitioner [facility] 11/29/2016, 8:58 AM

## 2016-11-29 NOTE — Patient Instructions (Addendum)
Susan Strickland, Thank you for coming in to clinic today.  1. You likely have bacterial upper respiratory infection with possible pneumonia.   - We are foregoing the X-Ray today at your request. - START taking doxycycline 100 mg twice per day (about every 12 hours) for 10 days.  Make sure to take all doses of your antibiotic. - While you are on an antibiotic, take a probiotic.  Antibiotics kill good and bad bacteria.  A probiotic helps to replace your good bacteria. Probiotic pills can be found over the counter.  One brand is Florastor, but you can use any brand you prefer.  You can also get good bacteria from foods.  These foods are yogurt. - Drink plenty of fluids.  - Start Continue anti-histamine loratadine or cetirizine 10mg  daily. - You may also use Flonase 2 sprays each nostril daily for up to 4-6 weeks  Other over the counter medications you may try, if needed for symptoms are: - If congestion is worse, start OTC Mucinex (or may try Mucinex-DM for cough) up to 7-10 days then stop - You may try over the counter Nasal Saline spray (Simply Saline, Ocean Spray) as needed to reduce congestion. - Start taking Tylenol extra strength 1 to 2 tablets every 6-8 hours for aches or fever/chills for next few days as needed.  Do not take more than 3,000 mg in 24 hours from all medicines.  You may also take ibuprofen 200-400mg  every 8 hours as needed.   - Drink warm herbal tea with honey for sore throat.   If symptoms are significantly worse with persistent fevers/chills despite tylenol/ibpurofen, nausea, vomiting unable to tolerate food/fluids or medicine, body aches, or shortness of breath, sinus pain pressure or worsening productive cough, then follow-up for re-evaluation, may seek more immediate care at Urgent Care or the ED if you are more concerned that it is an emergency.    Please schedule a follow-up appointment with Cassell Smiles, AGNP to Return 3-5 days if symptoms worsen or fail to improve.  If  you have any other questions or concerns, please feel free to call the clinic or send a message through [facility]. You may also schedule an earlier appointment if necessary.  Cassell Smiles, DNP, AGNP-BC Adult Gerontology Nurse Practitioner [facility], [facility]    Sinusitis, Adult Sinusitis is soreness and inflammation of your sinuses. Sinuses are hollow spaces in the bones around your face. Your sinuses are located:  Around your eyes.  In the middle of your forehead.  Behind your nose.  In your cheekbones. Your sinuses and nasal passages are lined with a stringy fluid (mucus). Mucus normally drains out of your sinuses. When your nasal tissues become inflamed or swollen, the mucus can become trapped or blocked so air cannot flow through your sinuses. This allows bacteria, viruses, and funguses to grow, which leads to infection. Sinusitis can develop quickly and last for 7?10 days (acute) or for more than 12 weeks (chronic). Sinusitis often develops after a cold. What are the causes? This condition is caused by anything that creates swelling in the sinuses or stops mucus from draining, including:  Allergies.  Asthma.  Bacterial or viral infection.  Abnormally shaped bones between the nasal passages.  Nasal growths that contain mucus (nasal polyps).  Narrow sinus openings.  Pollutants, such as chemicals or irritants in the air.  A foreign object stuck in the nose.  A fungal infection. This is rare. What increases the risk? The following factors may make you  more likely to develop this condition:  Having allergies or asthma.  Having had a recent cold or respiratory tract infection.  Having structural deformities or blockages in your nose or sinuses.  Having a weak immune system.  Doing a lot of swimming or diving.  Overusing nasal sprays.  Smoking. What are the signs or symptoms? The main symptoms of this condition are pain and a feeling of pressure  around the affected sinuses. Other symptoms include:  Upper toothache.  Earache.  Headache.  Bad breath.  Decreased sense of smell and taste.  A cough that may get worse at night.  Fatigue.  Fever.  Thick drainage from your nose. The drainage is often green and it may contain pus (purulent).  Stuffy nose or congestion.  Postnasal drip. This is when extra mucus collects in the throat or back of the nose.  Swelling and warmth over the affected sinuses.  Sore throat.  Sensitivity to light. How is this diagnosed? This condition is diagnosed based on symptoms, a medical history, and a physical exam. To find out if your condition is acute or chronic, your health care provider may:  Look in your nose for signs of nasal polyps.  Tap over the affected sinus to check for signs of infection.  View the inside of your sinuses using an imaging device that has a light attached (endoscope). If your health care provider suspects that you have chronic sinusitis, you may also:  Be tested for allergies.  Have a sample of mucus taken from your nose (nasal culture) and checked for bacteria.  Have a mucus sample examined to see if your sinusitis is related to an allergy. If your sinusitis does not respond to treatment and it lasts longer than 8 weeks, you may have an MRI or CT scan to check your sinuses. These scans also help to determine how severe your infection is. In rare cases, a bone biopsy may be done to rule out more serious types of fungal sinus disease. How is this treated? Treatment for sinusitis depends on the cause and whether your condition is chronic or acute. If a virus is causing your sinusitis, your symptoms will go away on their own within 10 days. You may be given medicines to relieve your symptoms, including:  Topical nasal decongestants. They shrink swollen nasal passages and let mucus drain from your sinuses.  Antihistamines. These drugs block inflammation that is  triggered by allergies. This can help to ease swelling in your nose and sinuses.  Topical nasal corticosteroids. These are nasal sprays that ease inflammation and swelling in your nose and sinuses.  Nasal saline washes. These rinses can help to get rid of thick mucus in your nose. If your condition is caused by bacteria, you will be given an antibiotic medicine. If your condition is caused by a fungus, you will be given an antifungal medicine. Surgery may be needed to correct underlying conditions, such as narrow nasal passages. Surgery may also be needed to remove polyps. Follow these instructions at home: Medicines   Take, use, or apply over-the-counter and prescription medicines only as told by your health care provider. These may include nasal sprays.  If you were prescribed an antibiotic medicine, take it as told by your health care provider. Do not stop taking the antibiotic even if you start to feel better. Hydrate and Humidify   Drink enough water to keep your urine clear or pale yellow. Staying hydrated will help to thin your mucus.  Use a  cool mist humidifier to keep the humidity level in your home above 50%.  Inhale steam for 10-15 minutes, 3-4 times a day or as told by your health care provider. You can do this in the bathroom while a hot shower is running.  Limit your exposure to cool or dry air. Rest   Rest as much as possible.  Sleep with your head raised (elevated).  Make sure to get enough sleep each night. General instructions   Apply a warm, moist washcloth to your face 3-4 times a day or as told by your health care provider. This will help with discomfort.  Wash your hands often with soap and water to reduce your exposure to viruses and other germs. If soap and water are not available, use hand sanitizer.  Do not smoke. Avoid being around people who are smoking (secondhand smoke).  Keep all follow-up visits as told by your health care provider. This is  important. Contact a health care provider if:  You have a fever.  Your symptoms get worse.  Your symptoms do not improve within 10 days. Get help right away if:  You have a severe headache.  You have persistent vomiting.  You have pain or swelling around your face or eyes.  You have vision problems.  You develop confusion.  Your neck is stiff.  You have trouble breathing. This information is not intended to replace advice given to you by your health care provider. Make sure you discuss any questions you have with your health care provider. Document Released: 06/28/2005 Document Revised: 02/22/2016 Document Reviewed: 04/23/2015 Elsevier Interactive Patient Education  2017 [employer].

## 2016-12-03 ENCOUNTER — Encounter: Payer: Self-pay | Admitting: Nurse Practitioner

## 2016-12-03 ENCOUNTER — Other Ambulatory Visit: Payer: Self-pay | Admitting: Nurse Practitioner

## 2016-12-03 DIAGNOSIS — J069 Acute upper respiratory infection, unspecified: Secondary | ICD-10-CM

## 2016-12-03 DIAGNOSIS — J22 Unspecified acute lower respiratory infection: Secondary | ICD-10-CM

## 2016-12-03 MED ORDER — AMOXICILLIN-POT CLAVULANATE 875-125 MG PO TABS: 1.0000 | ORAL_TABLET | Freq: Two times a day (BID) | ORAL | 0 refills | Status: DC

## 2016-12-21 ENCOUNTER — Ambulatory Visit (INDEPENDENT_AMBULATORY_CARE_PROVIDER_SITE_OTHER): Payer: BC Managed Care – PPO | Admitting: Obstetrics and Gynecology

## 2016-12-21 ENCOUNTER — Encounter: Payer: Self-pay | Admitting: Obstetrics and Gynecology

## 2016-12-21 VITALS — BP 126/78 | Ht 60.0 in | Wt 195.0 lb

## 2016-12-21 DIAGNOSIS — Z01419 Encounter for gynecological examination (general) (routine) without abnormal findings: Secondary | ICD-10-CM | POA: Diagnosis not present

## 2016-12-21 DIAGNOSIS — Z1151 Encounter for screening for human papillomavirus (HPV): Secondary | ICD-10-CM

## 2016-12-21 DIAGNOSIS — Z124 Encounter for screening for malignant neoplasm of cervix: Secondary | ICD-10-CM | POA: Diagnosis not present

## 2016-12-21 DIAGNOSIS — Z1231 Encounter for screening mammogram for malignant neoplasm of breast: Secondary | ICD-10-CM | POA: Diagnosis not present

## 2016-12-21 DIAGNOSIS — Z1239 Encounter for other screening for malignant neoplasm of breast: Secondary | ICD-10-CM

## 2016-12-21 DIAGNOSIS — Z803 Family history of malignant neoplasm of breast: Secondary | ICD-10-CM

## 2016-12-21 NOTE — Progress Notes (Signed)
Chief Complaint  Patient presents with  . Annual Exam     HPI:      [name] is a 51 y.o. [phone] who LMP was Patient's last menstrual period was 12/13/2016., presents today for her annual examination.  Her menses are regular every 28-30 days, lasting 6 days.  Dysmenorrhea none. She does not have intermenstrual bleeding.  Sex activity: single partner, contraception - vasectomy.  Last Pap: August 15, 2012  Results were: no abnormalities /neg HPV DNA  Hx of STDs: none  Last mammogram: 09/24/14.  Results were: normal--routine follow-up in 12 months There is a FH of breast cancer in her mom, genetic testing not done. Pt doesn't meet current insurance guidelines re: cancer genetic testing. There is no FH of ovarian cancer. The patient does do self-breast exams.  Tobacco use: The patient denies current or previous tobacco use. Alcohol use: none Exercise: moderately active  She does get adequate calcium and Vitamin D in her diet.   Past Medical History:  Diagnosis Date  . Anxiety   . Breast cyst   . Degenerative disc disease, lumbar   . Depression   . History of mammogram 05/2012; 09/24/2014   wnl per pt; benign  . History of Papanicolaou smear of cervix 08/15/2012   -/-  . Hyperlipemia   . Vitamin D deficiency     Past Surgical History:  Procedure Laterality Date  . BACK SURGERY    . 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     Family History  Problem Relation Age of Onset  . Breast cancer Mother 31  . Hypertension Father   . Kidney disease Father   . Diabetes Father        type 2  . Heart disease Father     Social History   Social History  . Marital status: Married    Spouse name: N/A  . Number of children: N/A  . Years of education: 70   Occupational History  . teacher    Social History Main Topics  . Smoking status: Never Smoker  . Smokeless tobacco: Never Used  . Alcohol use No  . Drug use: No  . Sexual activity: Yes    Birth  control/ protection: Surgical, Other-see comments     Comment: vasectomy   Other Topics Concern  . Not on file   Social History Narrative  . No narrative on file     Current Outpatient Prescriptions:  .  Ascorbic Acid (VITAMIN C) 1000 MG tablet, Take by mouth., Disp: , Rfl:  .  Cholecalciferol (VITAMIN D-1000 MAX ST) 1000 units tablet, Take by mouth., Disp: , Rfl:  .  Multiple Vitamin (MULTI-VITAMINS) TABS, Take by mouth., Disp: , Rfl:  .  sertraline (ZOLOFT) 50 MG tablet, Take 1.5 tablets (75 mg total) by mouth daily., Disp: 135 tablet, Rfl: 3 .  amoxicillin-clavulanate (AUGMENTIN) 875-125 MG tablet, Take 1 tablet by mouth 2 (two) times daily. (Patient not taking: Reported on 12/21/2016), Disp: 20 tablet, Rfl: 0 .  baclofen (LIORESAL) 10 MG tablet, Take 0.5-1 tablets (5-10 mg total) by mouth 3 (three) times daily as needed for muscle spasms. (Patient not taking: Reported on 11/29/2016), Disp: 60 each, Rfl: 1 .  benzonatate (TESSALON) 100 MG capsule, Take 1 capsule (100 mg total) by mouth 3 (three) times daily as needed for cough. (Patient not taking: Reported on 12/21/2016), Disp: 30 capsule, Rfl: 0 .  LOMAIRA 8 MG TABS, , Disp: , Rfl: 0 .  loratadine (CLARITIN) 10 MG tablet, Take 1 tablet (10 mg total) by mouth daily. (Patient not taking: Reported on 12/21/2016), Disp: 30 tablet, Rfl: 2 .  pseudoephedrine-guaifenesin (MUCINEX D) 60-600 MG 12 hr tablet, Take 1 tablet by mouth every 12 (twelve) hours., Disp: , Rfl:   ROS:  Review of Systems  Constitutional: Negative for fatigue, fever and unexpected weight change.  Respiratory: Negative for cough, shortness of breath and wheezing.   Cardiovascular: Negative for chest pain, palpitations and leg swelling.  Gastrointestinal: Negative for blood in stool, constipation, diarrhea, nausea and vomiting.  Endocrine: Negative for cold intolerance, heat intolerance and polyuria.  Genitourinary: Negative for dyspareunia, dysuria, flank pain, frequency,  genital sores, hematuria, menstrual problem, pelvic pain, urgency, vaginal bleeding, vaginal discharge and vaginal pain.  Musculoskeletal: Negative for back pain, joint swelling and myalgias.  Skin: Negative for rash.  Neurological: Negative for dizziness, syncope, light-headedness, numbness and headaches.  Hematological: Negative for adenopathy.  Psychiatric/Behavioral: Negative for agitation, confusion, sleep disturbance and suicidal ideas. The patient is not nervous/anxious.      Objective: BP 126/78   Ht 5' (1.524 m)   Wt 195 lb (88.5 kg)   LMP 12/13/2016   BMI 38.08 kg/m    Physical Exam  Constitutional: She is oriented to person, place, and time. She appears well-developed and well-nourished.  Genitourinary: Vagina normal and uterus normal. There is no rash or tenderness on the right labia. There is no rash or tenderness on the left labia. No erythema or tenderness in the vagina. No vaginal discharge found. Right adnexum does not display mass and does not display tenderness. Left adnexum does not display mass and does not display tenderness. Cervix does not exhibit motion tenderness or polyp. Uterus is not enlarged or tender.  Neck: Normal range of motion. No thyromegaly present.  Cardiovascular: Normal rate, regular rhythm and normal heart sounds.   No murmur heard. Pulmonary/Chest: Effort normal and breath sounds normal. Right breast exhibits no mass, no nipple discharge, no skin change and no tenderness. Left breast exhibits no mass, no nipple discharge, no skin change and no tenderness.  Abdominal: Soft. There is no tenderness. There is no guarding.  Musculoskeletal: Normal range of motion.  Neurological: She is alert and oriented to person, place, and time. No cranial nerve deficit.  Psychiatric: She has a normal mood and affect. Her behavior is normal.  Vitals reviewed.   Assessment/Plan: Encounter for annual routine gynecological examination  Cervical cancer screening  - Plan: IGP, Aptima HPV  Screening for HPV (human papillomavirus) - Plan: IGP, Aptima HPV  Screening for breast cancer - Pt to sched mammo. - Plan: MM SCREENING BREAST TOMO BILATERAL  Family history of breast cancer - Pt doesn't meet insurance criteria for cancer genetic testing currently, but will cont to follow for ins guideline changes. - Plan: MM SCREENING BREAST TOMO BILATERAL             GYN counsel breast self exam, mammography screening, adequate intake of calcium and vitamin D     F/U  Return in about 1 year (around 12/21/2017).  Rayshon Albaugh B. Kathleena Freeman, PA-C 12/21/2016 5:15 PM

## 2016-12-25 LAB — IGP, APTIMA HPV
HPV Aptima: NEGATIVE
PAP Smear Comment: 0

## 2016-12-27 ENCOUNTER — Ambulatory Visit
Admission: RE | Admit: 2016-12-27 | Discharge: 2016-12-27 | Disposition: A | Discharge status: Home / Self Care | Payer: BC Managed Care – PPO | Source: Ambulatory Visit | Attending: Obstetrics and Gynecology | Admitting: Obstetrics and Gynecology

## 2016-12-27 DIAGNOSIS — Z1231 Encounter for screening mammogram for malignant neoplasm of breast: Secondary | ICD-10-CM | POA: Diagnosis present

## 2016-12-27 DIAGNOSIS — Z803 Family history of malignant neoplasm of breast: Secondary | ICD-10-CM

## 2016-12-27 DIAGNOSIS — Z1239 Encounter for other screening for malignant neoplasm of breast: Secondary | ICD-10-CM

## 2016-12-28 ENCOUNTER — Other Ambulatory Visit: Payer: Self-pay | Admitting: *Deleted

## 2016-12-28 ENCOUNTER — Inpatient Hospital Stay
Admission: RE | Admit: 2016-12-28 | Discharge: 2016-12-28 | Disposition: A | Discharge status: Home / Self Care | Payer: Self-pay | Source: Ambulatory Visit | Attending: *Deleted | Admitting: *Deleted

## 2016-12-28 DIAGNOSIS — Z9289 Personal history of other medical treatment: Secondary | ICD-10-CM

## 2016-12-29 ENCOUNTER — Other Ambulatory Visit: Payer: Self-pay | Admitting: *Deleted

## 2016-12-29 ENCOUNTER — Inpatient Hospital Stay
Admission: RE | Admit: 2016-12-29 | Discharge: 2016-12-29 | Disposition: A | Discharge status: Home / Self Care | Payer: Self-pay | Source: Ambulatory Visit | Attending: *Deleted | Admitting: *Deleted

## 2016-12-29 DIAGNOSIS — Z9289 Personal history of other medical treatment: Secondary | ICD-10-CM

## 2016-12-31 ENCOUNTER — Other Ambulatory Visit: Payer: Self-pay | Admitting: Obstetrics and Gynecology

## 2016-12-31 DIAGNOSIS — R928 Other abnormal and inconclusive findings on diagnostic imaging of breast: Secondary | ICD-10-CM

## 2016-12-31 DIAGNOSIS — N6320 Unspecified lump in the left breast, unspecified quadrant: Secondary | ICD-10-CM

## 2017-01-17 ENCOUNTER — Ambulatory Visit
Admission: RE | Admit: 2017-01-17 | Discharge: 2017-01-17 | Disposition: A | Discharge status: Home / Self Care | Payer: BC Managed Care – PPO | Source: Ambulatory Visit | Attending: Obstetrics and Gynecology | Admitting: Obstetrics and Gynecology

## 2017-01-17 DIAGNOSIS — R928 Other abnormal and inconclusive findings on diagnostic imaging of breast: Secondary | ICD-10-CM

## 2017-01-17 DIAGNOSIS — N6320 Unspecified lump in the left breast, unspecified quadrant: Secondary | ICD-10-CM | POA: Insufficient documentation

## 2017-01-18 ENCOUNTER — Telehealth: Payer: Self-pay | Admitting: Obstetrics and Gynecology

## 2017-01-18 DIAGNOSIS — R928 Other abnormal and inconclusive findings on diagnostic imaging of breast: Secondary | ICD-10-CM

## 2017-01-18 NOTE — Telephone Encounter (Signed)
LMTRC

## 2017-01-18 NOTE — Telephone Encounter (Signed)
Pt aware of cat 3 mammo for LT breast nodules. Offered gen surg ref for 2nd opinion but pt ok waiting for 6 mo f/u for now. She will let me know if she changes her mind. Her mom also had breast cancer about 46, but that age is not definite. MyRisk testing discussed. Pt to try to find out age of dx and will f/u if qualifies for genetic testing.

## 2017-01-18 NOTE — Telephone Encounter (Signed)
Pt calling back to speak with Elmo Putt. Please call patient

## 2017-01-31 ENCOUNTER — Telehealth: Payer: Self-pay

## 2017-01-31 DIAGNOSIS — R928 Other abnormal and inconclusive findings on diagnostic imaging of breast: Secondary | ICD-10-CM

## 2017-01-31 NOTE — Telephone Encounter (Signed)
Left message to return call 

## 2017-01-31 NOTE — Telephone Encounter (Signed)
RN to notify pt we do gen surg ref. If she has pref, we can send there. Otherwise, I will send to Dr. Bary Castilla.

## 2017-01-31 NOTE — Telephone Encounter (Signed)
Pt states Dr Bary Castilla is fine.

## 2017-01-31 NOTE — Telephone Encounter (Signed)
Pt would like to get a second opinion regarding breast exam. Would like to know how to go about this.

## 2017-02-01 ENCOUNTER — Telehealth: Payer: Self-pay | Admitting: General Surgery

## 2017-02-01 DIAGNOSIS — R928 Other abnormal and inconclusive findings on diagnostic imaging of breast: Secondary | ICD-10-CM | POA: Insufficient documentation

## 2017-02-01 NOTE — Addendum Note (Signed)
Addended by: Ardeth Perfect B on: 5/59/7416 08:06 AM   Modules accepted: Orders

## 2017-02-01 NOTE — Telephone Encounter (Signed)
Referral submitted to Dr. Dwyane Luo office.

## 2017-02-01 NOTE — Telephone Encounter (Signed)
Susan Strickland to do gen surg ref to Dr. Bary Castilla for abn mammo.

## 2017-02-01 NOTE — Telephone Encounter (Signed)
LEFT MESSAGE FOR PATIENT TO CALL & MAKE AN APPOINTMENT WITH OUR [facility] FOR A CAT 3 MAMMO & U/S REF'D BY ALICIA COPLAND PA(DR R HARRIS) PLEASE CHECK WITH PATIENT TO SEE WHO SHE WANTS TO SEE.SHE SAW DR Jamal Collin IN 2013 & MS COLPAND REFERRED HER TO DR BYRNETT.

## 2017-02-02 ENCOUNTER — Encounter: Payer: Self-pay | Admitting: General Surgery

## 2017-02-08 ENCOUNTER — Ambulatory Visit (INDEPENDENT_AMBULATORY_CARE_PROVIDER_SITE_OTHER): Payer: BC Managed Care – PPO | Admitting: General Surgery

## 2017-02-08 ENCOUNTER — Encounter: Payer: Self-pay | Admitting: General Surgery

## 2017-02-08 VITALS — BP 112/64 | HR 80 | Resp 12 | Ht 60.0 in | Wt 197.0 lb

## 2017-02-08 DIAGNOSIS — N6324 Unspecified lump in the left breast, lower inner quadrant: Secondary | ICD-10-CM

## 2017-02-08 DIAGNOSIS — Z803 Family history of malignant neoplasm of breast: Secondary | ICD-10-CM

## 2017-02-08 DIAGNOSIS — N6322 Unspecified lump in the left breast, upper inner quadrant: Secondary | ICD-10-CM

## 2017-02-08 NOTE — Patient Instructions (Signed)
Follow up in December for left diagnostic mammogram

## 2017-02-08 NOTE — Progress Notes (Signed)
Patient ID: Susan Strickland, female   DOB: Mar 29, 1966, 51 y.o.   MRN: [id number]  Chief Complaint  Patient presents with  . Breast Problem    HPI Susan Strickland is a 51 y.o. female.  who presents for a breast evaluation. The most recent mammogram and ultrasound was done on 01-17-17.  Patient does perform regular self breast checks and gets regular mammograms done.   She feels like her left breast "feels different", a gel feeling upper lateral area. Pt has gastric bypass procedure in 2015 and states she has made some lifestyle changes. Has lost significant amount of weight in that time.  Denies any breast injury or trauma.  HPI  Past Medical History:  Diagnosis Date  . Anxiety   . Breast cyst   . Degenerative disc disease, lumbar   . Depression   . History of mammogram 05/2012; 09/24/2014   wnl per pt; benign  . History of Papanicolaou smear of cervix 08/15/2012   -/-  . Hyperlipemia   . Vitamin D deficiency     Past Surgical History:  Procedure Laterality Date  . BACK SURGERY    . BREAST BIOPSY Left 2016   NEG  . 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     Family History  Problem Relation Age of Onset  . Breast cancer Mother 58  . Hypertension Father   . Kidney disease Father   . Diabetes Father        type 2  . Heart disease Father     Social History Social History  Substance Use Topics  . Smoking status: Never Smoker  . Smokeless tobacco: Never Used  . Alcohol use No    Allergies  Allergen Reactions  . Lac Bovis Other (See Comments)    Headaches  . Doxycycline Nausea Only    Hot flashes, vivid dreams  . Milk-Related Compounds Other (See Comments)    headaches    Current Outpatient Prescriptions  Medication Sig Dispense Refill  . Ascorbic Acid (VITAMIN C) 1000 MG tablet Take by mouth.    . Cholecalciferol (VITAMIN D-1000 MAX ST) 1000 units tablet Take by mouth.    . Multiple Vitamin (MULTI-VITAMINS) TABS Take by mouth.    . sertraline (ZOLOFT)  50 MG tablet Take 1.5 tablets (75 mg total) by mouth daily. 135 tablet 3   No current facility-administered medications for this visit.     Review of Systems Review of Systems  Constitutional: Negative.   Respiratory: Negative.   Cardiovascular: Negative.     Blood pressure 112/64, pulse 80, resp. rate 12, height 5' (1.524 m), weight 197 lb (89.4 kg), last menstrual period 01/07/2017.  Physical Exam Physical Exam  Constitutional: She is oriented to person, place, and time. She appears well-developed and well-nourished.  Eyes: Conjunctivae are normal. No scleral icterus.  Neck: Neck supple.  Cardiovascular: Normal rate, regular rhythm and normal heart sounds.   Pulmonary/Chest: Effort normal and breath sounds normal. Right breast exhibits no inverted nipple, no mass, no nipple discharge, no skin change and no tenderness. Left breast exhibits no inverted nipple, no mass, no nipple discharge, no skin change and no tenderness. Breasts are symmetrical.    Abdominal: Soft. Bowel sounds are normal. There is no hepatomegaly. There is no tenderness. No hernia.  Lymphadenopathy:    She has no cervical adenopathy.    She has no axillary adenopathy.  Neurological: She is alert and oriented to person, place, and time.  Skin: Skin is warm  and dry.    Data Reviewed Recent mammograms  2 tiny nodules were identified in the left breast one in the upper inner quadrant and one in the lower inner quadrant. Ultrasound revealed 4-5 mm nodules both appearing benign. Six-month follow-up was recommended Assessment   Likely benign nodules the left breast. Agree with the short-term follow-up  Family history of breast cancer     Plan  Follow up in December with left diagnostic mammogram       HPI, Physical Exam, Assessment and Plan have been scribed under the direction and in the presence of Mckinley Jewel, MD Karie Fetch, RN  I have completed the exam and reviewed the above documentation for  accuracy and completeness.  I agree with the above.  Haematologist has been used and any errors in dictation or transcription are unintentional.  Rafiel Mecca G. Jamal Collin, M.D., F.A.C.S.  Junie Panning G 02/10/2017, 8:27 AM

## 2017-03-18 ENCOUNTER — Other Ambulatory Visit: Payer: Self-pay

## 2017-03-18 DIAGNOSIS — F329 Major depressive disorder, single episode, unspecified: Secondary | ICD-10-CM

## 2017-03-18 DIAGNOSIS — F32A Depression, unspecified: Secondary | ICD-10-CM

## 2017-03-18 MED ORDER — SERTRALINE HCL 50 MG PO TABS: 75.0000 mg | ORAL_TABLET | Freq: Every day | ORAL | 0 refills | Status: DC

## 2017-03-18 NOTE — Telephone Encounter (Signed)
Refill sent Sertraline 75mg  (1.5 tabs of 50mg ) #45 pills 0 refills, for 1 month supply  Follow-up within 1 month for extended refills.  Susan Strickland, [facility] 03/18/2017, 5:17 PM

## 2017-03-18 NOTE — Telephone Encounter (Signed)
Patient is calling requesting a refill of sertraline.  I told patient she would probley need an appointment.  She is out of meds and requesting 1 refill.

## 2017-03-21 NOTE — Telephone Encounter (Signed)
Left detailed message.   

## 2017-04-25 ENCOUNTER — Encounter: Payer: Self-pay | Admitting: Family Medicine

## 2017-04-25 ENCOUNTER — Ambulatory Visit (INDEPENDENT_AMBULATORY_CARE_PROVIDER_SITE_OTHER): Payer: BC Managed Care – PPO | Admitting: Family Medicine

## 2017-04-25 VITALS — BP 115/62 | HR 76 | Temp 98.4°F | Resp 16 | Ht 60.0 in | Wt 197.6 lb

## 2017-04-25 DIAGNOSIS — E782 Mixed hyperlipidemia: Secondary | ICD-10-CM

## 2017-04-25 DIAGNOSIS — E785 Hyperlipidemia, unspecified: Secondary | ICD-10-CM | POA: Insufficient documentation

## 2017-04-25 DIAGNOSIS — Z114 Encounter for screening for human immunodeficiency virus [HIV]: Secondary | ICD-10-CM

## 2017-04-25 DIAGNOSIS — D649 Anemia, unspecified: Secondary | ICD-10-CM | POA: Diagnosis not present

## 2017-04-25 DIAGNOSIS — R7309 Other abnormal glucose: Secondary | ICD-10-CM

## 2017-04-25 DIAGNOSIS — R799 Abnormal finding of blood chemistry, unspecified: Secondary | ICD-10-CM

## 2017-04-25 DIAGNOSIS — Z9884 Bariatric surgery status: Secondary | ICD-10-CM | POA: Diagnosis not present

## 2017-04-25 DIAGNOSIS — E538 Deficiency of other specified B group vitamins: Secondary | ICD-10-CM

## 2017-04-25 DIAGNOSIS — E559 Vitamin D deficiency, unspecified: Secondary | ICD-10-CM | POA: Diagnosis not present

## 2017-04-25 DIAGNOSIS — F3281 Premenstrual dysphoric disorder: Secondary | ICD-10-CM | POA: Diagnosis not present

## 2017-04-25 DIAGNOSIS — E669 Obesity, unspecified: Secondary | ICD-10-CM

## 2017-04-25 MED ORDER — SERTRALINE HCL 100 MG PO TABS: 100.0000 mg | ORAL_TABLET | Freq: Every day | ORAL | 5 refills | Status: DC

## 2017-04-25 NOTE — Assessment & Plan Note (Signed)
Mild recent decline with less effectiveness of SSRI at prior 50mg  dose Most consistent with PMDD given relation to menstrual cycle, limited other triggers  Plan: 1. Agree dose increase Sertraline from 50mg  to 100mg  daily - she has been on 75mg  to 100mg  intermittently with limited relief, but needs stable dose - New rx Sertraline 100mg  tabs, take one daily as instructed - Future plans may consider alternative SSRI or adjunct therapy if needed pending follow-up response

## 2017-04-25 NOTE — Assessment & Plan Note (Signed)
S/p Gastric Sleeve 2015, overall wt has remained stable in past several months, some mild wt gain - No longer followed by bariatrics - Due for annual physical and blood work, will check all labs as requested including vitamins with prior nutritional deficiency

## 2017-04-25 NOTE — Progress Notes (Signed)
Subjective:    Patient ID: Susan Strickland, female    DOB: Jun 30, 1966, 51 y.o.   MRN: [id number]  Susan Strickland is a 51 y.o. 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 Prior dx >4-5 years ago, she was tried on one SSRI initially but it was ineffective. Has been on Sertraline (Zoloft) 50mg  daily for 4 years now with improvement, within past 12 months she was advised to take 1.5 tabs for 75mg  daily, but only doing this PRN it seems, limited relief, today asking about dose increase - Denies any major life stressors or anxiety or stress affecting her symptoms - Denies any suicidal or homicidal ideation  S/p Gastric Sleeve (2015) / History of Vitamin D and B12 deficiency - S/p Gastric Sleeve done 3 years ago due to obesity, has done fairly well overall, she followed up for labs for 3 years then the office relocated to [facility] and she is unable to return to follow-up, asking if we can check blood work here. - No longer taking Vitamin D, or B12 - Asking about history of anemia  Additional history  - S/p Back Surgery L5-S1 microdiscectomy 2013. Currently doing well, takes tumeric daily  Health Maintenance: - Due for Flu Shot, declines today despite counseling on benefits - likely will consider at next visit in 4 weeks - Due for routine HIV screen, agree check with lab  Past Medical History:  Diagnosis Date  . Anxiety   . Breast cyst   . Degenerative disc disease, lumbar   . Depression   . History of mammogram 05/2012; 09/24/2014   wnl per pt; benign  . History of Papanicolaou smear of cervix 08/15/2012   -/-  . Hyperlipemia   . Vitamin D deficiency    Past Surgical History:    Procedure Laterality Date  . BACK SURGERY    . BREAST BIOPSY Left 2016   NEG  . 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     Depression screen [facility] 2/9 04/25/2017 02/20/2016 10/21/2015  Decreased Interest 0 0 0  Down, Depressed, Hopeless 1 0 1  PHQ - 2 Score 1 0 1  Altered sleeping 2 0 0  Tired, decreased energy 2 1 0  Change in appetite 0 1 0  Feeling bad or failure about yourself  1 1 0  Trouble concentrating 0 0 0  Moving slowly or fidgety/restless 0 0 0  Suicidal thoughts 0 0 0  PHQ-9 Score 6 3 1   Difficult doing work/chores Not difficult at all Somewhat difficult Not difficult at all    Social History  Substance Use Topics  . Smoking status: Never Smoker  . Smokeless tobacco: Never Used  . Alcohol use No    Review of Systems Per HPI unless specifically indicated above     Objective:    BP 115/62   Pulse 76   Temp 98.4 F (36.9 C) (Oral)   Resp 16   Ht 5' (1.524 m)   Wt 197 lb 9.6 oz (89.6 kg)   BMI 38.59 kg/m   Wt Readings from Last 3 Encounters:  04/25/17 197 lb 9.6 oz (89.6 kg)  02/08/17 197 lb (89.4 kg)  12/21/16 195 lb (88.5 kg)  Physical Exam  Constitutional: She is oriented to person, place, and time. She appears well-developed and well-nourished. No distress.  Well-appearing, comfortable, cooperative  HENT:  Head: Normocephalic and atraumatic.  Mouth/Throat: Oropharynx is clear and moist.  Eyes: Conjunctivae are normal. Right eye exhibits no discharge. Left eye exhibits no discharge.  Cardiovascular: Normal rate, regular rhythm, normal heart sounds and intact distal pulses.   No murmur heard. Pulmonary/Chest: Effort normal and breath sounds normal. No respiratory distress. She has no wheezes. She has no rales.  Musculoskeletal: She exhibits no edema.  Neurological: She is alert and oriented to person, place, and time.  Skin: Skin is warm and dry. No rash noted. She is not diaphoretic. No erythema.  Psychiatric: She has a normal mood and  affect. Her behavior is normal.  Well groomed, good eye contact, normal speech and thoughts  Nursing note and vitals reviewed.    Results for orders placed or performed in visit on 12/21/16  HM MAMMOGRAPHY  Result Value Ref Range   HM Mammogram 0-4 Bi-Rad 0-4 Bi-Rad, Self Reported Normal  HM PAP SMEAR  Result Value Ref Range   HM Pap smear -/-   IGP, Aptima HPV  Result Value Ref Range   DIAGNOSIS: Comment    Specimen adequacy: Comment    Clinician Provided ICD10 Comment    Performed by: Comment    PAP Smear Comment .    Note: Comment    Test Methodology Comment    HPV Aptima Negative Negative      Assessment & Plan:   Problem List Items Addressed This Visit    Hyperlipidemia   Relevant Orders   Lipid panel   Normocytic anemia   Relevant Orders   CBC with Differential/Platelet   Folate   Iron and TIBC   Ferritin   Obesity (BMI 30-39.9)   Relevant Orders   Comprehensive metabolic panel   PMDD (premenstrual dysphoric disorder) - Primary    Mild recent decline with less effectiveness of SSRI at prior 50mg  dose Most consistent with PMDD given relation to menstrual cycle, limited other triggers  Plan: 1. Agree dose increase Sertraline from 50mg  to 100mg  daily - she has been on 75mg  to 100mg  intermittently with limited relief, but needs stable dose - New rx Sertraline 100mg  tabs, take one daily as instructed - Future plans may consider alternative SSRI or adjunct therapy if needed pending follow-up response      Relevant Medications   sertraline (ZOLOFT) 100 MG tablet   Other Relevant Orders   Comprehensive metabolic panel   S/P bariatric surgery    S/p Gastric Sleeve 2015, overall wt has remained stable in past several months, some mild wt gain - No longer followed by bariatrics - Due for annual physical and blood work, will check all labs as requested including vitamins with prior nutritional deficiency      Relevant Orders   CBC with Differential/Platelet    Vitamin B12   Folate   Iron and TIBC   VITAMIN D 25 Hydroxy (Vit-D Deficiency, Fractures)   Magnesium   Vitamin B12 nutritional deficiency   Relevant Orders   CBC with Differential/Platelet   Vitamin B12   Vitamin D deficiency   Relevant Orders   VITAMIN D 25 Hydroxy (Vit-D Deficiency, Fractures)    Other Visit Diagnoses    Screening for HIV (human immunodeficiency virus)       Relevant Orders   HIV antibody   Abnormal blood chemistry       Relevant  Orders   Comprehensive metabolic panel   Magnesium   Abnormal glucose       Relevant Orders   Hemoglobin A1c      Meds ordered this encounter  Medications  . sertraline (ZOLOFT) 100 MG tablet    Sig: Take 1 tablet (100 mg total) by mouth daily.    Dispense:  30 tablet    Refill:  5    Follow up plan: Return in about 4 weeks (around 05/23/2017) for Annual Physical.  Labs ordered for [facility] by patient request, and orders printed, given to patient, advised to get drawn within 2-3 weeks then return 1 week later for annual.  Nobie Putnam, [facility] 04/25/2017, 10:54 PM

## 2017-04-25 NOTE — Patient Instructions (Addendum)
Thank you for coming to the clinic today.  1. Refilled Zoloft (Sertraline) increased dose at 100mg  daily, may take 3-6 weeks for full effect.  Will check labs and review at next apt  DUE for FASTING BLOOD WORK (no food or drink after midnight before the lab appointment, only water or coffee without cream/sugar on the morning of)  [facility] 1-4 WEEKS  For Lab Results, once available within 2-3 days of blood draw, you can can log in to MyChart online to view your results and a brief explanation. Also, we can discuss results at next follow-up visit.  Please schedule a Follow-up Appointment to: Return in about 4 weeks (around 05/23/2017) for Annual Physical.  If you have any other questions or concerns, please feel free to call the clinic or send a message through [facility]. You may also schedule an earlier appointment if necessary.  Additionally, you may be receiving a survey about your experience at our clinic within a few days to 1 week by e-mail or mail. We value your feedback.  Nobie Putnam, DO [place]

## 2017-05-12 ENCOUNTER — Telehealth: Payer: Self-pay

## 2017-05-12 NOTE — Telephone Encounter (Signed)
Patient is in recalls for December for [place] [facility] and follow up with Dr Jamal Collin. The patient is already scheduled for their mammogram in January next year. She will need to reschedule her mammogram to December in order to see Dr Jamal Collin afterwards.

## 2017-05-19 ENCOUNTER — Encounter: Payer: Self-pay | Admitting: General Surgery

## 2017-05-19 NOTE — Telephone Encounter (Signed)
I have left another message for the patient to call us back.

## 2017-06-28 ENCOUNTER — Inpatient Hospital Stay: Admission: RE | Admit: 2017-06-28 | Payer: Federal, State, Local not specified - PPO | Source: Ambulatory Visit

## 2017-06-29 ENCOUNTER — Ambulatory Visit (INDEPENDENT_AMBULATORY_CARE_PROVIDER_SITE_OTHER): Payer: BC Managed Care – PPO | Admitting: General Surgery

## 2017-06-29 ENCOUNTER — Encounter: Payer: Self-pay | Admitting: General Surgery

## 2017-06-29 VITALS — BP 130/74 | HR 80 | Resp 12 | Ht 60.0 in | Wt 199.0 lb

## 2017-06-29 DIAGNOSIS — N6324 Unspecified lump in the left breast, lower inner quadrant: Secondary | ICD-10-CM | POA: Diagnosis not present

## 2017-06-29 NOTE — Patient Instructions (Signed)
ent to return for her six month bilateral mammogram with her PCP. The patient is aware to call back for any questions or concerns.

## 2017-06-29 NOTE — Progress Notes (Signed)
Patient ID: Susan Strickland, female   DOB: Feb 25, 1966, 51 y.o.   MRN: [id number]  Chief Complaint  Patient presents with  . Follow-up    HPI Susan Strickland is a 51 y.o. female who presents for a breast evaluation. Here today following up from her six month left breast check. She had two tiny nodules 4-5 mm size located on the inner aspect of the left breast-seen on ultrasound.  These were felt to be benign 6 months ago on a six-month follow-up left breast diagnostic mammogram was recommended and this is scheduled for the beginning of the year.  Patient does perform regular self breast checks and gets regular mammograms done.    HPI  Past Medical History:  Diagnosis Date  . Anxiety   . Breast cyst   . Degenerative disc disease, lumbar   . Depression   . History of mammogram 05/2012; 09/24/2014   wnl per pt; benign  . History of Papanicolaou smear of cervix 08/15/2012   -/-  . Hyperlipemia   . Vitamin D deficiency     Past Surgical History:  Procedure Laterality Date  . BACK SURGERY    . BREAST BIOPSY Left 2016   NEG  . 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     Family History  Problem Relation Age of Onset  . Breast cancer Mother 65  . Hypertension Father   . Kidney disease Father   . Diabetes Father        type 2  . Heart disease Father     Social History Social History   Tobacco Use  . Smoking status: Never Smoker  . Smokeless tobacco: Never Used  Substance Use Topics  . Alcohol use: No  . Drug use: No    Allergies  Allergen Reactions  . Lac Bovis Other (See Comments)    Headaches  . Doxycycline Nausea Only    Hot flashes, vivid dreams  . Milk-Related Compounds Other (See Comments)    headaches    Current Outpatient Medications  Medication Sig Dispense Refill  . Ascorbic Acid (VITAMIN C) 1000 MG tablet Take by mouth.    . Cholecalciferol (VITAMIN D-1000 MAX ST) 1000 units tablet Take by mouth.    . sertraline (ZOLOFT) 100 MG tablet Take 1  tablet (100 mg total) by mouth daily. 30 tablet 5  . Multiple Vitamin (MULTI-VITAMINS) TABS Take by mouth.     No current facility-administered medications for this visit.     Review of Systems Review of Systems  Constitutional: Negative.   Respiratory: Negative.   Cardiovascular: Negative.     Blood pressure 130/74, pulse 80, resp. rate 12, height 5' (1.524 m), weight 199 lb (90.3 kg).  Physical Exam Physical Exam  Constitutional: She is oriented to person, place, and time. She appears well-developed and well-nourished.  Eyes: Conjunctivae are normal. No scleral icterus.  Neck: Neck supple.  Cardiovascular: Normal rate, regular rhythm and normal heart sounds.  Pulmonary/Chest: Effort normal and breath sounds normal.  Abdominal: Soft. Bowel sounds are normal. She exhibits no distension and no mass. There is no tenderness.  Lymphadenopathy:    She has no cervical adenopathy.    She has no axillary adenopathy.  Neurological: She is alert and oriented to person, place, and time.  Skin: Skin is warm and dry.    Data Reviewed Prior notes   Assessment Benign-appearing tiny nodules in the left breast for which follow-up mammogram and ultrasound is scheduled for early January. Exam  is stable with no findings noted on the left breast Family history of breast cancer         Plan       Patient to return for her six month bilateral mammogram with her PCP. The patient is aware to call back for any questions or concerns.   HPI, Physical Exam, Assessment and Plan have been scribed under the direction and in the presence of Mckinley Jewel, MD  Gaspar Cola, CMA I have completed the exam and reviewed the above documentation for accuracy and completeness.  I agree with the above.  Haematologist has been used and any errors in dictation or transcription are unintentional.  Jacqui Headen G. Jamal Collin, M.D., F.A.C.S.   Junie Panning G 06/30/2017, 10:37 AM

## 2017-06-30 ENCOUNTER — Other Ambulatory Visit: Payer: Federal, State, Local not specified - PPO

## 2017-07-15 ENCOUNTER — Ambulatory Visit
Admission: RE | Admit: 2017-07-15 | Discharge: 2017-07-15 | Disposition: A | Discharge status: Home / Self Care | Payer: BC Managed Care – PPO | Source: Ambulatory Visit | Attending: Obstetrics and Gynecology | Admitting: Obstetrics and Gynecology

## 2017-07-15 DIAGNOSIS — R928 Other abnormal and inconclusive findings on diagnostic imaging of breast: Secondary | ICD-10-CM

## 2017-07-21 ENCOUNTER — Other Ambulatory Visit: Payer: Federal, State, Local not specified - PPO

## 2017-10-20 ENCOUNTER — Other Ambulatory Visit: Payer: Self-pay

## 2017-10-20 DIAGNOSIS — Z1231 Encounter for screening mammogram for malignant neoplasm of breast: Secondary | ICD-10-CM

## 2017-11-24 ENCOUNTER — Ambulatory Visit (INDEPENDENT_AMBULATORY_CARE_PROVIDER_SITE_OTHER): Payer: BC Managed Care – PPO | Admitting: Family Medicine

## 2017-11-24 ENCOUNTER — Encounter: Payer: Self-pay | Admitting: Family Medicine

## 2017-11-24 ENCOUNTER — Other Ambulatory Visit: Payer: Self-pay | Admitting: Family Medicine

## 2017-11-24 VITALS — BP 118/71 | HR 83 | Temp 98.3°F | Resp 16 | Ht 60.0 in | Wt 201.4 lb

## 2017-11-24 DIAGNOSIS — S0990XA Unspecified injury of head, initial encounter: Secondary | ICD-10-CM

## 2017-11-24 DIAGNOSIS — M62838 Other muscle spasm: Secondary | ICD-10-CM

## 2017-11-24 DIAGNOSIS — S060X0A Concussion without loss of consciousness, initial encounter: Secondary | ICD-10-CM

## 2017-11-24 MED ORDER — BACLOFEN 10 MG PO TABS: 5.0000 mg | ORAL_TABLET | Freq: Three times a day (TID) | ORAL | 1 refills | Status: DC | PRN
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
## 2017-11-24 NOTE — Patient Instructions (Addendum)
Thank you for coming to the office today.  Likely concussion - uncertain duration may gradually keep improving  Recommend to start taking Tylenol Extra Strength 500mg  tabs - take 1 to 2 tabs per dose (max 1000mg ) every 6-8 hours for pain (take regularly, don't skip a dose for next 7 days), max 24 hour daily dose is 6 tablets or 3000mg . In the future you can repeat the same everyday Tylenol course for 1-2 weeks at a time.   Start taking Baclofen (Lioresal) 10mg  (muscle relaxant) - start with half (cut) to one whole pill at night as needed for next 1-3 nights (may make you drowsy, caution with driving) see how it affects you, then if tolerated increase to one pill 2 to 3 times a day or (every 8 hours as needed)  May go to Chiropractor after 1-2 weeks if needed, this can help restore head / spine mechanics to improve mood  No imaging at this time from our office - we may send you for a Head CT if not improved by 2 more weeks - to evaluate possible concussion  If acute worsening headache - worst headache of life, not improving, loss of vision, numbness tingling weakness, facial droop, lose consciousness   Please schedule a Follow-up Appointment to: Return in about 1 month (around 12/22/2017), or if symptoms worsen or fail to improve, for headaches, concussion.  If you have any other questions or concerns, please feel free to call the office or send a message through [place]. You may also schedule an earlier appointment if necessary.  Additionally, you may be receiving a survey about your experience at our office within a few days to 1 week by e-mail or mail. We value your feedback.  Nobie Putnam, DO [place]

## 2017-12-28 ENCOUNTER — Other Ambulatory Visit: Payer: Self-pay | Admitting: General Surgery

## 2017-12-28 ENCOUNTER — Ambulatory Visit
Admission: RE | Admit: 2017-12-28 | Discharge: 2017-12-28 | Disposition: A | Discharge status: Home / Self Care | Payer: BC Managed Care – PPO | Source: Ambulatory Visit | Attending: General Surgery | Admitting: General Surgery

## 2017-12-28 DIAGNOSIS — Z1231 Encounter for screening mammogram for malignant neoplasm of breast: Secondary | ICD-10-CM

## 2018-01-03 ENCOUNTER — Ambulatory Visit: Payer: BC Managed Care – PPO | Admitting: General Surgery

## 2018-02-15 ENCOUNTER — Other Ambulatory Visit: Payer: Self-pay | Admitting: Family Medicine

## 2018-02-15 DIAGNOSIS — F3281 Premenstrual dysphoric disorder: Secondary | ICD-10-CM

## 2018-02-15 MED ORDER — SERTRALINE HCL 100 MG PO TABS: 100.0000 mg | ORAL_TABLET | Freq: Every day | ORAL | 11 refills | Status: DC

## 2018-02-15 NOTE — Telephone Encounter (Signed)
Sent rx Zoloft with refills - please notify patient that this rx was sent as she requested.  She may schedule a yearly physical in November or December 2019. She can get labs done here or at [facility] - she may notify us in advance to request labs or we can do this after visit.  Nobie Putnam, [facility] 02/15/2018, 3:49 PM

## 2018-02-15 NOTE — Telephone Encounter (Signed)
The pt was notified. No questions or concerns. 

## 2018-02-15 NOTE — Telephone Encounter (Signed)
Pt called requesting a refill on  Zoloft called into [facility] for a year. Pt call back # is [phone] PT ALSO WOULD LIKE FOR SOMEONE TO CALL HER WHEN PRESCRIPTION called in

## 2018-02-24 ENCOUNTER — Encounter: Payer: Self-pay | Admitting: Family Medicine

## 2018-02-24 ENCOUNTER — Ambulatory Visit (INDEPENDENT_AMBULATORY_CARE_PROVIDER_SITE_OTHER): Payer: BC Managed Care – PPO | Admitting: Family Medicine

## 2018-02-24 VITALS — BP 119/74 | HR 79 | Temp 98.1°F | Resp 16 | Ht 60.0 in | Wt 205.0 lb

## 2018-02-24 DIAGNOSIS — Z9884 Bariatric surgery status: Secondary | ICD-10-CM

## 2018-02-24 DIAGNOSIS — R195 Other fecal abnormalities: Secondary | ICD-10-CM

## 2018-02-24 DIAGNOSIS — E860 Dehydration: Secondary | ICD-10-CM | POA: Diagnosis not present

## 2018-02-24 DIAGNOSIS — R197 Diarrhea, unspecified: Secondary | ICD-10-CM

## 2018-02-24 DIAGNOSIS — D649 Anemia, unspecified: Secondary | ICD-10-CM | POA: Diagnosis not present

## 2018-02-24 DIAGNOSIS — K909 Intestinal malabsorption, unspecified: Secondary | ICD-10-CM | POA: Diagnosis not present

## 2018-02-24 DIAGNOSIS — R110 Nausea: Secondary | ICD-10-CM

## 2018-02-24 LAB — COMPLETE METABOLIC PANEL WITH GFR
AG Ratio: 1.4 (calc) (ref 1.0–2.5)
ALKALINE PHOSPHATASE (APISO): 57 U/L (ref 33–130)
ALT: 15 U/L (ref 6–29)
AST: 20 U/L (ref 10–35)
Albumin: 3.9 g/dL (ref 3.6–5.1)
BUN: 16 mg/dL (ref 7–25)
CO2: 26 mmol/L (ref 20–32)
CREATININE: 0.79 mg/dL (ref 0.50–1.05)
Calcium: 8.9 mg/dL (ref 8.6–10.4)
Chloride: 105 mmol/L (ref 98–110)
GFR, Est African American: 101 mL/min/{1.73_m2} (ref >=60)
GFR, Est Non African American: 87 mL/min/{1.73_m2} (ref >=60)
GLOBULIN: 2.8 g/dL (ref 1.9–3.7)
Glucose, Bld: 105 mg/dL — ABNORMAL HIGH (ref 65–99)
POTASSIUM: 4.2 mmol/L (ref 3.5–5.3)
SODIUM: 139 mmol/L (ref 135–146)
Total Bilirubin: 0.3 mg/dL (ref 0.2–1.2)
Total Protein: 6.7 g/dL (ref 6.1–8.1)

## 2018-02-24 LAB — MAGNESIUM: MAGNESIUM: 1.8 mg/dL (ref 1.5–2.5)

## 2018-02-24 LAB — CBC WITH DIFFERENTIAL/PLATELET

## 2018-02-24 LAB — SPECIMEN COMPROMISED

## 2018-02-24 MED ORDER — ONDANSETRON 4 MG PO TBDP: 4.0000 mg | ORAL_TABLET | Freq: Three times a day (TID) | ORAL | 0 refills | Status: DC | PRN

## 2018-02-24 MED ORDER — DICYCLOMINE HCL 10 MG PO CAPS: 10.0000 mg | ORAL_CAPSULE | Freq: Three times a day (TID) | ORAL | 0 refills | Status: DC

## 2018-02-24 NOTE — Patient Instructions (Addendum)
Thank you for coming to the office today.  Referral placed to GI - stay tuned for apt - call them if not heard back.  [facility]) [place], [place] [address] Phone: [phone]  For short term - Try Zofran as needed for nausea - Try Dicyclomine (Bentyl) for abdominal cramping - may continue Imodium AD  Try to keep improving hydration, pedialyte or other  STAT labs sent if we get results before 5 we will notify you, otherwise on call provider may contact you, if you do not hear back with lab results you may contact the oncall provider through our answering service.  If any significant worsening diarrhea, nausea, or if vomiting, return of fevers, worsening blood in stool or dark stools, abdominal pain, or dehydration - may need to go more immediately to hospital ED for evaluation as we discussed this may need IV treatment or imaging.  Please schedule a Follow-up Appointment to: Return in about 6 weeks (around 04/07/2018) for GI follow-up.  If you have any other questions or concerns, please feel free to call the office or send a message through [place]. You may also schedule an earlier appointment if necessary.  Additionally, you may be receiving a survey about your experience at our office within a few days to 1 week by e-mail or mail. We value your feedback.  Nobie Putnam, DO [place]

## 2018-02-24 NOTE — Progress Notes (Addendum)
Subjective:    Patient ID: Susan Strickland, female    DOB: Nov 02, 1965, 52 y.o.   MRN: [id number]  Susan Strickland is a 52 y.o. female presenting on 02/24/2018 for Diarrhea (fever, nausea, can't hold food, pedialyte was helping, notice blood in stool onset month and last week was worst)   HPI   Nausea / Diarrhea / S/p Gastric Sleeve (2015) / History of Vitamin D and B12 deficiency Chronic problem with GI issues. She has had complications from her gastric sleeve surgery in past that seems to have affected her digestion and bowel habits. This had been fairly well controlled in the past. Until recently worsening. She used to do well on probiotic and supplement / vitamin shake daily, and now she thinks this is worsening her problems. - Today she reports now for past 1 month she has described diarrhea with bowel changes and abdominal cramping. She states at first was more bright yellow liquid stool and then semi formed and then looser now with some darker or "rust" colored possible blood without bright red bleeding. - She has stopped the supplement shake in AM. She has scaled back on most vitamins. - She is having difficulty taking in food poor PO intake recently. She admits feeling dehydrated recently and was not keeping in fluids, she was nauseas and could not eat many solids, but did not have vomiting. She now has been able to take Pedialyte and doing better for past 3 days. She feels somewhat better but still diarrhea. No known sick contacts with stomach bug or similar symptoms. - Tried Imodium AD OTC without any significant relief, following dosing on package - used to take Dicyclomine with good results for abdominal cramping. She is requesting nausea medication - She is asking about blood work today and she does not have a GI specialist following her. - No history of gallbladder problems or appendix. - Admits she had temperature or fever back when this first started but then it resolved, no  more fever Denies any active worsening abdominal pain, vomiting, fever, chills, sweats, dysuria, headache, dyspnea, cough  Past Surgical History:  Procedure Laterality Date  . BACK SURGERY    . BREAST BIOPSY Left 2016   NEG  . 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     Depression screen [facility] 2/9 02/24/2018 11/24/2017 04/25/2017  Decreased Interest 0 0 0  Down, Depressed, Hopeless 0 0 1  PHQ - 2 Score 0 0 1  Altered sleeping - - 2  Tired, decreased energy - - 2  Change in appetite - - 0  Feeling bad or failure about yourself  - - 1  Trouble concentrating - - 0  Moving slowly or fidgety/restless - - 0  Suicidal thoughts - - 0  PHQ-9 Score - - 6  Difficult doing work/chores - - Not difficult at all    Social History   Tobacco Use  . Smoking status: Never Smoker  . Smokeless tobacco: Never Used  Substance Use Topics  . Alcohol use: No  . Drug use: No    Review of Systems Per HPI unless specifically indicated above     Objective:    BP 119/74   Pulse 79   Temp 98.1 F (36.7 C) (Oral)   Resp 16   Ht 5' (1.524 m)   Wt 205 lb (93 kg)   BMI 40.04 kg/m   Wt Readings from Last 3 Encounters:  02/24/18 205 lb (93 kg)  11/24/17 201 lb  6.4 oz (91.4 kg)  06/29/17 199 lb (90.3 kg)    Physical Exam  Constitutional: She is oriented to person, place, and time. She appears well-developed and well-nourished. No distress.  Currently well-appearing, comfortable, cooperative, obese  HENT:  Head: Normocephalic and atraumatic.  Mouth/Throat: Oropharynx is clear and moist.  Eyes: Conjunctivae are normal. Right eye exhibits no discharge. Left eye exhibits no discharge.  Cardiovascular: Normal rate, regular rhythm, normal heart sounds and intact distal pulses.  No murmur heard. Pulmonary/Chest: Effort normal and breath sounds normal. No respiratory distress. She has no wheezes. She has no rales.  Abdominal: Soft. Bowel sounds are normal. She exhibits no distension and no mass.  There is no tenderness. There is no guarding.  RUQ - non tender, negative Murphy's RLQ - non tender, negative McBurney's  Musculoskeletal: Normal range of motion. She exhibits no edema.  Neurological: She is alert and oriented to person, place, and time.  Skin: Skin is warm and dry. No rash noted. She is not diaphoretic. No erythema.  Psychiatric: She has a normal mood and affect. Her behavior is normal.  Well groomed, good eye contact, normal speech and thoughts  Nursing note and vitals reviewed.      Assessment & Plan:   Problem List Items Addressed This Visit    Normocytic anemia   Relevant Orders   CBC with Differential/Platelet   S/P bariatric surgery   Relevant Orders   Ambulatory referral to Gastroenterology    Other Visit Diagnoses    Diarrhea due to malabsorption    -  Primary   Relevant Medications   dicyclomine (BENTYL) 10 MG capsule   Other Relevant Orders   COMPLETE METABOLIC PANEL WITH GFR   Magnesium   Ambulatory referral to Gastroenterology   Mild dehydration       Nausea without vomiting       Relevant Medications   ondansetron (ZOFRAN ODT) 4 MG disintegrating tablet   Other Relevant Orders   COMPLETE METABOLIC PANEL WITH GFR   Magnesium   Ambulatory referral to Gastroenterology   Dark stools       Relevant Orders   CBC with Differential/Platelet   Ambulatory referral to Gastroenterology      Clinically with concerning persistent diarrhea / functional vs malabsorption diarrhea vs GI symptoms and poor PO intake hydration now for past 1 month, seems to fluctuate some days better others worse. This is first she is seeking care. Clinically she is hydrated and hemodynamic stable vitals today, tolerating PO intake food and liquids, but smaller amount. - Benign abdomen today, non tender  Plan - Discussion on symptoms, ultimately she will need 2nd opinion GI for her chronic function issues and possibly malabsorption changes after gastric sleeve years ago -  Referral placed today to [facility] for further evaluation and management - However, given acute nature of her presentation today - we offered STAT lab tests to be drawn here since she is not clinically unstable to require hospital ED evaluation at this time. I advised our lab is closed in AM only open part time, we can get CMA to draw labs and send out as STAT, may not be available by 5pm today - otherwise on call can notify her of any critical or concerning lab results. - Start Dicyclomine PRN and Start Zofran PRN as well - goal to improve PO intake temporarily and improve symptoms until she can have further GI work-up - Strict return criteria given if she is worsening at any point and cannot  keep down any PO or has worse abdominal pain, diarrhea, dehydration or new symptoms   Meds ordered this encounter  Medications  . ondansetron (ZOFRAN ODT) 4 MG disintegrating tablet    Sig: Take 1 tablet (4 mg total) by mouth every 8 (eight) hours as needed for nausea or vomiting.    Dispense:  30 tablet    Refill:  0  . dicyclomine (BENTYL) 10 MG capsule    Sig: Take 1 capsule (10 mg total) by mouth 4 (four) times daily -  before meals and at bedtime.    Dispense:  40 capsule    Refill:  0   Orders Placed This Encounter  Procedures  . CBC with Differential/Platelet  . COMPLETE METABOLIC PANEL WITH GFR  . Magnesium  . Ambulatory referral to Gastroenterology    Referral Priority:   Routine    Referral Type:   Consultation    Referral Reason:   Specialty Services Required    Number of Visits Requested:   1   Follow up plan: Return in about 6 weeks (around 04/07/2018) for GI follow-up.  STAT labs ordered today CMET, CBC, Magnesium - labs will be called to our office before 5 or on call provider after. Patient advised that if do not hear back on labs and if any severe worsening in symptoms she may go to hospital ED for further evaluation and immediate testing.  Nobie Putnam, [facility] 02/24/2018, 2:31 PM  -------------------------------------------------------------- UPDATE 02/24/18 - 5:48PM  Tried to call patient. Did not reach her. Left a voicemail to check mychart for results.  1. Chemistry - Normal results, including electrolytes, kidney and liver function. - Slightly elevated non fasting sugar but this is not a concern. - Normal magnesium  It does look reassuring not losing too many electrolytes from diarrhea at this time.  2. Blood Count CBC - this tube apparently was never received by the lab company, unfortunately it seems there may have been a mishandling of this at some point. We have already contacted and asked them about it and they are looking into the issue.  If not improving or acute worsening as discussed, then may seek care more urgently at hospital ED, otherwise may contact us back next week.  Nobie Putnam, [facility] 02/24/2018, 5:48 PM

## 2018-03-06 ENCOUNTER — Telehealth: Payer: Self-pay | Admitting: Family Medicine

## 2018-03-06 DIAGNOSIS — R109 Unspecified abdominal pain: Secondary | ICD-10-CM

## 2018-03-06 DIAGNOSIS — K921 Melena: Secondary | ICD-10-CM

## 2018-03-06 NOTE — Telephone Encounter (Signed)
See other messages

## 2018-03-06 NOTE — Telephone Encounter (Signed)
Left message for patient to call back  

## 2018-03-06 NOTE — Telephone Encounter (Signed)
Patient advised as per Dr. Raliegh Ip scheduling appointment tomorrow for lab work and Friday with Dr. Raliegh Ip.

## 2018-03-06 NOTE — Telephone Encounter (Signed)
Pt asked for a phone call to discuss what she thinks is going on and possibly getting medication called in [phone]

## 2018-03-06 NOTE — Telephone Encounter (Signed)
Pt. Returning call. Pt call back # is [phone]

## 2018-03-06 NOTE — Telephone Encounter (Signed)
Patient reports onset 3 days noticing black stool which has little consistency now but was watery in past, also throwing up now as compare to nausea only in past, abdominal cramp, can't eat certain food except bread, banana and apple pudding, she thinks that she might have ulcer and was asking if you can able to Rx ulcer antibiotics. Her appointment with gastro is not until 04/17/18. Please suggest ?

## 2018-03-06 NOTE — Telephone Encounter (Signed)
Her symptoms have changed it seems since last appointment.  She needs to be tested for H Pylori prior to antibiotic treatment, we do not treat with antibiotics for ulcer if H Pylori is not present.  She can schedule a fasting lab only apt for H Pylori Breath test here in the office this week. I would recommend that she schedule an office visit by end of this week to discuss her results and start treatment as well.  If acute symptoms worsening severe pain or bleeding or cannot keep food down or new problems, may contact us back or seek help more immediately at hospital ED.  Susan Strickland, [facility] 03/06/2018, 4:08 PM

## 2018-03-07 ENCOUNTER — Other Ambulatory Visit: Payer: BC Managed Care – PPO

## 2018-03-08 LAB — H. PYLORI BREATH TEST: H. pylori Breath Test: NOT DETECTED

## 2018-03-10 ENCOUNTER — Encounter: Payer: Self-pay | Admitting: Family Medicine

## 2018-03-10 ENCOUNTER — Other Ambulatory Visit: Payer: Self-pay | Admitting: Family Medicine

## 2018-03-10 ENCOUNTER — Ambulatory Visit (INDEPENDENT_AMBULATORY_CARE_PROVIDER_SITE_OTHER): Payer: BC Managed Care – PPO | Admitting: Family Medicine

## 2018-03-10 VITALS — BP 121/77 | HR 72 | Temp 98.0°F | Resp 16 | Ht 60.0 in | Wt 206.0 lb

## 2018-03-10 DIAGNOSIS — K219 Gastro-esophageal reflux disease without esophagitis: Secondary | ICD-10-CM

## 2018-03-10 DIAGNOSIS — R197 Diarrhea, unspecified: Secondary | ICD-10-CM

## 2018-03-10 DIAGNOSIS — K909 Intestinal malabsorption, unspecified: Secondary | ICD-10-CM

## 2018-03-10 MED ORDER — OMEPRAZOLE 40 MG PO CPDR
40.0000 mg | DELAYED_RELEASE_CAPSULE | Freq: Every day | ORAL | 2 refills | Status: DC
Start: 2018-03-10 — End: 2018-06-03

## 2018-03-10 MED ORDER — SUCRALFATE 1 G PO TABS
1.0000 g | ORAL_TABLET | Freq: Three times a day (TID) | ORAL | 0 refills | Status: DC
Start: 2018-03-10 — End: 2018-03-10

## 2018-03-10 NOTE — Progress Notes (Signed)
Subjective:    Patient ID: Susan Strickland, female    DOB: 04/18/1966, 52 y.o.   MRN: [id number]  Susan Strickland is a 52 y.o. female presenting on 03/10/2018 for Diarrhea due to malabsorption (test result)   HPI   Follow-up GERD / Nausea / Diarrhea / S/p Gastric Sleeve (2015) Ozzie Hoyle of Vitamin Dand B12deficiency - Last visit with me 02/24/18, for initial visit for same problem, treated with lab work that was unremarkable, started on dicyclomine and zofran PRN, and referral to [facility], see prior notes for background information. - Interval update with unable to see GI until 04/17/18 - Also called on 8/26 complaining of more GERD symptoms and concern for possible PUD, she scheduled to return and was asked to take H Pylori breath test prior to return. Results were NEGATIVE - Today patient reports seems her symptoms have improved but still has persistent diarrhea, GI intolerance to most foods, she has remained on BRAT diet with minimal and bland foods, seems to tolerate well, but she still has symptoms. - Additionally now describes more concerns of recent GERD and heartburn after meals, upper abdominal discomfort. Never taken H2 blocker or PPI in past. No prior EGD. No history of PUD - History of gastric surgery - Also admits occasional episodic sharp upper back pain, randomly Admits nausea w/o vomiting Denies any active worsening abdominal pain, vomiting, fever, chills, sweats, dysuria, headache, dyspnea, cough, dark stool blood in stool  Depression screen [facility] 2/9 03/10/2018 02/24/2018 11/24/2017  Decreased Interest 0 0 0  Down, Depressed, Hopeless 0 0 0  PHQ - 2 Score 0 0 0  Altered sleeping - - -  Tired, decreased energy - - -  Change in appetite - - -  Feeling bad or failure about yourself  - - -  Trouble concentrating - - -  Moving slowly or fidgety/restless - - -  Suicidal thoughts - - -  PHQ-9 Score - - -  Difficult doing work/chores - - -    Social History   Tobacco  Use  . Smoking status: Never Smoker  . Smokeless tobacco: Never Used  Substance Use Topics  . Alcohol use: No  . Drug use: No    Review of Systems Per HPI unless specifically indicated above     Objective:    BP 121/77   Pulse 72   Temp 98 F (36.7 C) (Oral)   Resp 16   Ht 5' (1.524 m)   Wt 206 lb (93.4 kg)   BMI 40.23 kg/m   Wt Readings from Last 3 Encounters:  03/10/18 206 lb (93.4 kg)  02/24/18 205 lb (93 kg)  11/24/17 201 lb 6.4 oz (91.4 kg)    Physical Exam  Constitutional: She is oriented to person, place, and time. She appears well-developed and well-nourished. No distress.  Currently well-appearing, comfortable, cooperative, obese  HENT:  Head: Normocephalic and atraumatic.  Mouth/Throat: Oropharynx is clear and moist.  Eyes: Conjunctivae are normal. Right eye exhibits no discharge. Left eye exhibits no discharge.  Cardiovascular: Normal rate, regular rhythm, normal heart sounds and intact distal pulses.  No murmur heard. Pulmonary/Chest: Effort normal and breath sounds normal. No respiratory distress. She has no wheezes. She has no rales.  Abdominal: Soft. Bowel sounds are normal. She exhibits no distension.  Did not repeat full abdominal exam today since no current active abdominal symptoms.  Last exam 2 weeks ago was negative for reproduced abdominal tenderness  Musculoskeletal: Normal range of motion. She exhibits  no edema.  Neurological: She is alert and oriented to person, place, and time.  Skin: Skin is warm and dry. No rash noted. She is not diaphoretic. No erythema.  Psychiatric: She has a normal mood and affect. Her behavior is normal.  Well groomed, good eye contact, normal speech and thoughts  Nursing note and vitals reviewed.    Results for orders placed or performed in visit on 03/07/18  H. pylori breath test  Result Value Ref Range   H. pylori Breath Test NOT DETECTED NOT DETECT      Assessment & Plan:   Problem List Items Addressed  This Visit    None    Visit Diagnoses    Gastroesophageal reflux disease, esophagitis presence not specified    -  Primary   Relevant Medications   omeprazole (PRILOSEC) 40 MG capsule   Diarrhea due to malabsorption          Clinically stable more functional GI symptoms with persistent diarrhea, suspect malabsorption / gastric surgery related. Improved on adjusted diet. Additionally with GERD symptoms, seem relatively new, no prior chronic history of, no prior dx or treatment. Concern nausea, some early satiety. Cannot confirm PUD, seems less likely based on history Negative H Pylori  Plan 1. Start prolonged PPI trial with Omeprazole 40mg  daily for 4 weeks, anticipate may need up to total 8 weeks 2. Start carafate 1g TID WC + QHS PRN for symptom relief 3. Strict return criteria given for worsening symptoms 4. Follow-up as needed or sooner given return criteria if acute worsening  Await f/u with [facility] 10/7 for further management   Meds ordered this encounter  Medications  . omeprazole (PRILOSEC) 40 MG capsule    Sig: Take 1 capsule (40 mg total) by mouth daily before breakfast.    Dispense:  30 capsule    Refill:  2  . DISCONTD: sucralfate (CARAFATE) 1 g tablet    Sig: Take 1 tablet (1 g total) by mouth 4 (four) times daily -  with meals and at bedtime.    Dispense:  40 tablet    Refill:  0    Follow up plan: Return in about 2 weeks (around 03/24/2018), or if symptoms worsen or fail to improve, for GERD / diarrhea.  Nobie Putnam, DO [facility] 03/10/2018, 6:18 PM

## 2018-03-10 NOTE — Patient Instructions (Addendum)
Thank you for coming to the office today.  I am not sure the exact cause of your abdominal pain, however I am concerned that one significant possibility could be uncontrolled Acid Reflux (GERD) and may have developed an Ulcer (Peptic Ulcer of stomach).  IF BREATH TEST = NEGATIVE (or waiting for results): Starting tonight before dinner take Omeprazole 40mg  daily. Prefer to take this med about 30 min before breakfast or 1st meal of day for 4 weeks, don't stop taking unless we discuss first. Probably will need for about 8 weeks total if this ends up being an Ulcer.  Take other prescribed medicine Carafate (Sucralfate) as needed up to 4 times daily (3 meals and bedtime)  to coat stomach lining to ease symptoms, if it helps reduce symptoms then it is more likely to be due to acid and/or ulcer.  DIET RECOMMENDATIONS - Avoid spicy, greasy, fried foods, tomatoes, pizza also things like caffeine, dark chocolate, peppermint can worsen - Avoid large meals and late night snacks, also do not go more than 4-5 hours without a snack or meal (not eating will worsen reflux symptoms due to stomach acid) - You may also elevate the head of your bed at night to sleep at very slight incline to help reduce symptoms  If the problem improves but keeps coming back, we can discuss higher dose or longer course at next visit.  If symptoms are worsening or persistent despite treatment or develop any different severe esophagus or abdominal pain, unable to swallow solids or liquids, nausea, vomiting especially blood in vomit, fever/chills, or unintentional weight loss / no appetite, please follow-up sooner in office or seek more immediate medical attention at hospital Emergency Department.  Regarding other medicines:  - STOP taking Ibuprofen, Advil, Motrin, Goody's / BC powder - DO NOT take without discussing with your doctor. These medicines can put you at high risk for future bleeding.  If need pain medicine, may take  Tylenol Extra Strength (Acetaminophen) 500mg  tabs - take 1 to 2 tabs per dose (max 1000mg ) every 6-8 hours for pain (take regularly, don't skip a dose for next 7 days), max 24 hour daily dose is 6 tablets or 3000mg . In the future you can repeat the same everyday Tylenol course for 1-2 weeks at a time.   Please schedule a Follow-up Appointment to: Return in about 2 weeks (around 03/24/2018), or if symptoms worsen or fail to improve, for GERD / diarrhea.  If you have any other questions or concerns, please feel free to call the office or send a message through [facility]. You may also schedule an earlier appointment if necessary.  Additionally, you may be receiving a survey about your experience at our office within a few days to 1 week by e-mail or mail. We value your feedback.  Nobie Putnam, DO [facility]

## 2018-03-16 ENCOUNTER — Encounter: Payer: Self-pay | Admitting: *Deleted

## 2018-03-21 ENCOUNTER — Telehealth: Payer: Self-pay | Admitting: Family Medicine

## 2018-03-21 NOTE — Telephone Encounter (Signed)
Pt called requesting another referral her appt  was October 7th, said that she needed to get in earlier. Pt call back [phone]

## 2018-03-21 NOTE — Telephone Encounter (Signed)
Attempted to call patient. Did not reach her.  I referred her on 02/24/18. Her apt is scheduled for [facility] on 04/17/18 with Dr Bonna Gains.  Now this appointment is less than 1 month away.  ----------------------------------------  Could you notify her of the following?  My recommendation is to keep the appointment as it is scheduled. She may call them at any time during this next 1-2 weeks to check on status of any cancellation of other apt and maybe they can re-schedule her sooner.  Only other option is a referral to [facility] - however, often we have seen wait times >4-6 weeks for them as well.  Nobie Putnam, DO [facility] 03/21/2018, 1:39 PM

## 2018-03-22 NOTE — Telephone Encounter (Signed)
Patient notified

## 2018-04-17 ENCOUNTER — Encounter: Payer: Self-pay | Admitting: Gastroenterology

## 2018-04-17 ENCOUNTER — Encounter

## 2018-04-17 ENCOUNTER — Ambulatory Visit (INDEPENDENT_AMBULATORY_CARE_PROVIDER_SITE_OTHER): Payer: BC Managed Care – PPO | Admitting: Gastroenterology

## 2018-04-17 VITALS — BP 132/91 | HR 94 | Resp 17 | Ht 60.0 in | Wt 208.8 lb

## 2018-04-17 DIAGNOSIS — Z1211 Encounter for screening for malignant neoplasm of colon: Secondary | ICD-10-CM | POA: Diagnosis not present

## 2018-04-17 DIAGNOSIS — R109 Unspecified abdominal pain: Secondary | ICD-10-CM

## 2018-04-17 NOTE — Patient Instructions (Signed)
F/U in 3 months.

## 2018-04-19 NOTE — Progress Notes (Signed)
Sent first page of instructions for [facility] office to pt via My Chart. Colonoscopy and EGD were to be scheduled in [facility] outpatient surgery center.

## 2018-04-19 NOTE — H&P (View-Only) (Signed)
Susan Strickland [address]  [place]  [place], [place] [address]  Main: [phone]  Fax: [phone]   Gastroenterology Consultation  Referring Provider:     Nobie Strickland * Primary Care Physician:  Susan Hauser, DO Primary Gastroenterologist:  Dr. Vonda Strickland Reason for Consultation:    Abdominal pain        HPI:    Chief Complaint  Patient presents with  . New Patient (Initial Visit)    S/P bariatric surgery, 03/2014 gastric sleeve   . Diarrhea    stool color changes   . Nausea  . Anemia    Susan Strickland is a 52 y.o. y/o female referred for consultation & management  by Dr. Parks Strickland, Susan Doughty, DO.  52 year old female with history of gastric sleeve in 2015 referred for abdominal pain with negative H. pylori breath test.  Also reports history of chronic diarrhea, and eats a brat diet to help her symptoms but continues to have multiple loose problems a day.  No hematochezia or melena.  No prior colonoscopies.  No prior EGDs.  No weight loss.  No dysphagia.  Past Medical History:  Diagnosis Date  . Anxiety   . Breast cyst   . Degenerative disc disease, lumbar   . Depression   . History of mammogram 05/2012; 09/24/2014   wnl per pt; benign  . History of Papanicolaou smear of cervix 08/15/2012   -/-  . Hyperlipemia   . Vitamin D deficiency     Past Surgical History:  Procedure Laterality Date  . BACK SURGERY    . BREAST BIOPSY Left 2016   NEG  . 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     Prior to Admission medications   Medication Sig Start Date End Date Taking? Authorizing Provider  Ascorbic Acid (VITAMIN C) 1000 MG tablet Take by mouth.   Yes [provider]  baclofen (LIORESAL) 10 MG tablet Take 0.5-1 tablets (5-10 mg total) by mouth 3 (three) times daily as needed for muscle spasms. 11/24/17  Yes Karamalegos, Susan Doughty, DO  Cholecalciferol (VITAMIN D-1000 MAX ST) 1000 units tablet Take by mouth.    Yes [provider]  dicyclomine (BENTYL) 10 MG capsule Take 1 capsule (10 mg total) by mouth 4 (four) times daily -  before meals and at bedtime. 02/24/18  Yes Karamalegos, Susan Doughty, DO  Multiple Vitamin (MULTI-VITAMINS) TABS Take by mouth.   Yes [provider]  omeprazole (PRILOSEC) 40 MG capsule Take 1 capsule (40 mg total) by mouth daily before breakfast. 03/10/18  Yes Karamalegos, Susan J, DO  ondansetron (ZOFRAN ODT) 4 MG disintegrating tablet Take 1 tablet (4 mg total) by mouth every 8 (eight) hours as needed for nausea or vomiting. 02/24/18  Yes Karamalegos, Susan Doughty, DO  sertraline (ZOLOFT) 100 MG tablet Take 1 tablet (100 mg total) by mouth daily. 02/15/18  Yes Karamalegos, Susan Doughty, DO  sucralfate (CARAFATE) 1 g tablet TAKE 1 TABLET(1 GRAM) BY MOUTH FOUR TIMES DAILY AT BEDTIME WITH MEALS 03/10/18  Yes Susan Hauser, DO    Family History  Problem Relation Age of Onset  . Breast cancer Mother 98  . Hypertension Father   . Kidney disease Father   . Diabetes Father        type 2  . Heart disease Father      Social History   Tobacco Use  . Smoking status: Never Smoker  . Smokeless tobacco: Never Used  Substance Use  Topics  . Alcohol use: No  . Drug use: No    Allergies as of 04/17/2018 - Review Complete 04/17/2018  Allergen Reaction Noted  . Lac bovis Other (See Comments) 10/21/2015  . Doxycycline Nausea Only 12/03/2016  . Milk-related compounds Other (See Comments) 10/21/2015    Review of Systems:    All systems reviewed and negative except where noted in HPI.   Physical Exam:  BP (!) 132/91 (BP Location: Left Arm, Patient Position: Sitting, Cuff Size: Large)   Pulse 94   Resp 17   Ht 5' (1.524 m)   Wt 208 lb 12.8 oz (94.7 kg)   BMI 40.78 kg/m  No LMP recorded. Psych:  Alert and cooperative. Normal mood and affect. General:   Alert,  Well-developed, well-nourished, pleasant and cooperative in NAD Head:  Normocephalic and  atraumatic. Eyes:  Sclera clear, no icterus.   Conjunctiva pink. Ears:  Normal auditory acuity. Nose:  No deformity, discharge, or lesions. Mouth:  No deformity or lesions,oropharynx pink & moist. Neck:  Supple; no masses or thyromegaly. Abdomen:  Normal bowel sounds.  No bruits.  Soft, non-tender and non-distended without masses, hepatosplenomegaly or hernias noted.  No guarding or rebound tenderness.    Msk:  Symmetrical without gross deformities. Good, equal movement & strength bilaterally. Pulses:  Normal pulses noted. Extremities:  No clubbing or edema.  No cyanosis. Neurologic:  Alert and oriented x3;  grossly normal neurologically. Skin:  Intact without significant lesions or rashes. No jaundice. Lymph Nodes:  No significant cervical adenopathy. Psych:  Alert and cooperative. Normal mood and affect.   Labs: CBC    Component Value Date/Time   WBC CANCELED 02/24/2018 1346   RBC 4.49 04/28/2014 0915   HGB 11.7 (L) 04/28/2014 0915   HCT 37.2 04/28/2014 0915   PLT 219 04/28/2014 0915   MCV 83 04/28/2014 0915   MCH 26.1 04/28/2014 0915   MCHC 31.5 (L) 04/28/2014 0915   RDW 16.2 (H) 04/28/2014 0915   CMP     Component Value Date/Time   NA 139 02/24/2018 1346   NA 139 04/28/2014 0915   K 4.2 02/24/2018 1346   K 3.5 04/28/2014 0915   CL 105 02/24/2018 1346   CL 104 04/28/2014 0915   CO2 26 02/24/2018 1346   CO2 23 04/28/2014 0915   GLUCOSE 105 (H) 02/24/2018 1346   GLUCOSE 96 04/28/2014 0915   BUN 16 02/24/2018 1346   BUN 15 04/28/2014 0915   CREATININE 0.79 02/24/2018 1346   CALCIUM 8.9 02/24/2018 1346   CALCIUM 8.3 (L) 04/28/2014 0915   PROT 6.7 02/24/2018 1346   PROT 7.2 04/28/2014 0915   ALBUMIN 3.0 (L) 04/28/2014 0915   AST 20 02/24/2018 1346   AST 19 04/28/2014 0915   ALT 15 02/24/2018 1346   ALT 23 04/28/2014 0915   ALKPHOS 71 04/28/2014 0915   BILITOT 0.3 02/24/2018 1346   BILITOT 0.5 04/28/2014 0915   GFRNONAA 87 02/24/2018 1346   GFRAA 101 02/24/2018  1346    Imaging Studies: No results found.  Assessment and Plan:   Susan Strickland is a 52 y.o. y/o female has been referred for abdominal pain and diarrhea  We will order infectious work-up, check fecal calprotectin and pancreatic elastase to evaluate etiology of diarrhea Unlikely to be infectious given chronic symptoms  Patient also is reporting abdominal pain and is having to eat a brat diet to help with her symptoms Screening colonoscopy indicated at this time which would also allow  [place] to obtain biopsies for microscopic colitis and evaluate for any underlying inflammation EGD due to ongoing abdominal pain  I have discussed alternative options, risks & benefits,  which include, but are not limited to, bleeding, infection, perforation,respiratory complication & drug reaction.  The patient agrees with this plan & written consent will be obtained.       Dr Susan Strickland  Speech recognition software was used to dictate the above note.

## 2018-04-19 NOTE — Progress Notes (Signed)
Susan Strickland [address]  [place]  [place], [place] [address]  Main: [phone]  Fax: [phone]   Gastroenterology Consultation  Referring Provider:     Nobie Putnam * Primary Care Physician:  Olin Hauser, DO Primary Gastroenterologist:  Dr. Vonda Strickland Reason for Consultation:    Abdominal pain        HPI:    Chief Complaint  Patient presents with  . New Patient (Initial Visit)    S/P bariatric surgery, 03/2014 gastric sleeve   . Diarrhea    stool color changes   . Nausea  . Anemia    MARLOWE CINQUEMANI is a 52 y.o. y/o female referred for consultation & management  by Dr. Parks Ranger, Devonne Doughty, DO.  52 year old female with history of gastric sleeve in 2015 referred for abdominal pain with negative H. pylori breath test.  Also reports history of chronic diarrhea, and eats a brat diet to help her symptoms but continues to have multiple loose problems a day.  No hematochezia or melena.  No prior colonoscopies.  No prior EGDs.  No weight loss.  No dysphagia.  Past Medical History:  Diagnosis Date  . Anxiety   . Breast cyst   . Degenerative disc disease, lumbar   . Depression   . History of mammogram 05/2012; 09/24/2014   wnl per pt; benign  . History of Papanicolaou smear of cervix 08/15/2012   -/-  . Hyperlipemia   . Vitamin D deficiency     Past Surgical History:  Procedure Laterality Date  . BACK SURGERY    . BREAST BIOPSY Left 2016   NEG  . 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     Prior to Admission medications   Medication Sig Start Date End Date Taking? Authorizing Provider  Ascorbic Acid (VITAMIN C) 1000 MG tablet Take by mouth.   Yes [provider]  baclofen (LIORESAL) 10 MG tablet Take 0.5-1 tablets (5-10 mg total) by mouth 3 (three) times daily as needed for muscle spasms. 11/24/17  Yes Karamalegos, Devonne Doughty, DO  Cholecalciferol (VITAMIN D-1000 MAX ST) 1000 units tablet Take by mouth.    Yes [provider]  dicyclomine (BENTYL) 10 MG capsule Take 1 capsule (10 mg total) by mouth 4 (four) times daily -  before meals and at bedtime. 02/24/18  Yes Karamalegos, Devonne Doughty, DO  Multiple Vitamin (MULTI-VITAMINS) TABS Take by mouth.   Yes [provider]  omeprazole (PRILOSEC) 40 MG capsule Take 1 capsule (40 mg total) by mouth daily before breakfast. 03/10/18  Yes Karamalegos, Alexander J, DO  ondansetron (ZOFRAN ODT) 4 MG disintegrating tablet Take 1 tablet (4 mg total) by mouth every 8 (eight) hours as needed for nausea or vomiting. 02/24/18  Yes Karamalegos, Devonne Doughty, DO  sertraline (ZOLOFT) 100 MG tablet Take 1 tablet (100 mg total) by mouth daily. 02/15/18  Yes Karamalegos, Devonne Doughty, DO  sucralfate (CARAFATE) 1 g tablet TAKE 1 TABLET(1 GRAM) BY MOUTH FOUR TIMES DAILY AT BEDTIME WITH MEALS 03/10/18  Yes Olin Hauser, DO    Family History  Problem Relation Age of Onset  . Breast cancer Mother 87  . Hypertension Father   . Kidney disease Father   . Diabetes Father        type 2  . Heart disease Father      Social History   Tobacco Use  . Smoking status: Never Smoker  . Smokeless tobacco: Never Used  Substance Use  Topics  . Alcohol use: No  . Drug use: No    Allergies as of 04/17/2018 - Review Complete 04/17/2018  Allergen Reaction Noted  . Lac bovis Other (See Comments) 10/21/2015  . Doxycycline Nausea Only 12/03/2016  . Milk-related compounds Other (See Comments) 10/21/2015    Review of Systems:    All systems reviewed and negative except where noted in HPI.   Physical Exam:  BP (!) 132/91 (BP Location: Left Arm, Patient Position: Sitting, Cuff Size: Large)   Pulse 94   Resp 17   Ht 5' (1.524 m)   Wt 208 lb 12.8 oz (94.7 kg)   BMI 40.78 kg/m  No LMP recorded. Psych:  Alert and cooperative. Normal mood and affect. General:   Alert,  Well-developed, well-nourished, pleasant and cooperative in NAD Head:  Normocephalic and  atraumatic. Eyes:  Sclera clear, no icterus.   Conjunctiva pink. Ears:  Normal auditory acuity. Nose:  No deformity, discharge, or lesions. Mouth:  No deformity or lesions,oropharynx pink & moist. Neck:  Supple; no masses or thyromegaly. Abdomen:  Normal bowel sounds.  No bruits.  Soft, non-tender and non-distended without masses, hepatosplenomegaly or hernias noted.  No guarding or rebound tenderness.    Msk:  Symmetrical without gross deformities. Good, equal movement & strength bilaterally. Pulses:  Normal pulses noted. Extremities:  No clubbing or edema.  No cyanosis. Neurologic:  Alert and oriented x3;  grossly normal neurologically. Skin:  Intact without significant lesions or rashes. No jaundice. Lymph Nodes:  No significant cervical adenopathy. Psych:  Alert and cooperative. Normal mood and affect.   Labs: CBC    Component Value Date/Time   WBC CANCELED 02/24/2018 1346   RBC 4.49 04/28/2014 0915   HGB 11.7 (L) 04/28/2014 0915   HCT 37.2 04/28/2014 0915   PLT 219 04/28/2014 0915   MCV 83 04/28/2014 0915   MCH 26.1 04/28/2014 0915   MCHC 31.5 (L) 04/28/2014 0915   RDW 16.2 (H) 04/28/2014 0915   CMP     Component Value Date/Time   NA 139 02/24/2018 1346   NA 139 04/28/2014 0915   K 4.2 02/24/2018 1346   K 3.5 04/28/2014 0915   CL 105 02/24/2018 1346   CL 104 04/28/2014 0915   CO2 26 02/24/2018 1346   CO2 23 04/28/2014 0915   GLUCOSE 105 (H) 02/24/2018 1346   GLUCOSE 96 04/28/2014 0915   BUN 16 02/24/2018 1346   BUN 15 04/28/2014 0915   CREATININE 0.79 02/24/2018 1346   CALCIUM 8.9 02/24/2018 1346   CALCIUM 8.3 (L) 04/28/2014 0915   PROT 6.7 02/24/2018 1346   PROT 7.2 04/28/2014 0915   ALBUMIN 3.0 (L) 04/28/2014 0915   AST 20 02/24/2018 1346   AST 19 04/28/2014 0915   ALT 15 02/24/2018 1346   ALT 23 04/28/2014 0915   ALKPHOS 71 04/28/2014 0915   BILITOT 0.3 02/24/2018 1346   BILITOT 0.5 04/28/2014 0915   GFRNONAA 87 02/24/2018 1346   GFRAA 101 02/24/2018  1346    Imaging Studies: No results found.  Assessment and Plan:   DONNI OGLESBY is a 52 y.o. y/o female has been referred for abdominal pain and diarrhea  We will order infectious work-up, check fecal calprotectin and pancreatic elastase to evaluate etiology of diarrhea Unlikely to be infectious given chronic symptoms  Patient also is reporting abdominal pain and is having to eat a brat diet to help with her symptoms Screening colonoscopy indicated at this time which would also allow  [place] to obtain biopsies for microscopic colitis and evaluate for any underlying inflammation EGD due to ongoing abdominal pain  I have discussed alternative options, risks & benefits,  which include, but are not limited to, bleeding, infection, perforation,respiratory complication & drug reaction.  The patient agrees with this plan & written consent will be obtained.       Dr Susan Strickland  Speech recognition software was used to dictate the above note.

## 2018-04-24 ENCOUNTER — Other Ambulatory Visit: Payer: Self-pay

## 2018-04-24 ENCOUNTER — Encounter: Payer: Self-pay | Admitting: *Deleted

## 2018-04-26 NOTE — Discharge Instructions (Signed)
General Anesthesia, Adult, Care After °These instructions provide you with information about caring for yourself after your procedure. Your health care provider may also give you more specific instructions. Your treatment has been planned according to current medical practices, but problems sometimes occur. Call your health care provider if you have any problems or questions after your procedure. °What can I expect after the procedure? °After the procedure, it is common to have: °· Vomiting. °· A sore throat. °· Mental slowness. ° °It is common to feel: °· Nauseous. °· Cold or shivery. °· Sleepy. °· Tired. °· Sore or achy, even in parts of your body where you did not have surgery. ° °Follow these instructions at home: °For at least 24 hours after the procedure: °· Do not: °? Participate in activities where you could fall or become injured. °? Drive. °? Use heavy machinery. °? Drink alcohol. °? Take sleeping pills or medicines that cause drowsiness. °? Make important decisions or sign legal documents. °? Take care of children on your own. °· Rest. °Eating and drinking °· If you vomit, drink water, juice, or soup when you can drink without vomiting. °· Drink enough fluid to keep your urine clear or pale yellow. °· Make sure you have little or no nausea before eating solid foods. °· Follow the diet recommended by your health care provider. °General instructions °· Have a responsible adult stay with you until you are awake and alert. °· Return to your normal activities as told by your health care provider. Ask your health care provider what activities are safe for you. °· Take over-the-counter and prescription medicines only as told by your health care provider. °· If you smoke, do not smoke without supervision. °· Keep all follow-up visits as told by your health care provider. This is important. °Contact a health care provider if: °· You continue to have nausea or vomiting at home, and medicines are not helpful. °· You  cannot drink fluids or start eating again. °· You cannot urinate after 8-12 hours. °· You develop a skin rash. °· You have fever. °· You have increasing redness at the site of your procedure. °Get help right away if: °· You have difficulty breathing. °· You have chest pain. °· You have unexpected bleeding. °· You feel that you are having a life-threatening or urgent problem. °This information is not intended to replace advice given to you by your health care provider. Make sure you discuss any questions you have with your health care provider. °Document Released: 10/04/2000 Document Revised: 12/01/2015 Document Reviewed: 06/12/2015 °Elsevier Interactive Patient Education © 2018 Elsevier Inc. ° °

## 2018-04-27 ENCOUNTER — Ambulatory Visit: Payer: BC Managed Care – PPO | Admitting: Anesthesiology

## 2018-04-27 ENCOUNTER — Ambulatory Visit
Admission: RE | Admit: 2018-04-27 | Discharge: 2018-04-27 | Disposition: A | Discharge status: Home / Self Care | Payer: BC Managed Care – PPO | Source: Ambulatory Visit | Attending: Gastroenterology | Admitting: Gastroenterology

## 2018-04-27 ENCOUNTER — Encounter
Admission: RE | Disposition: A | Discharge status: Home / Self Care | Payer: Self-pay | Source: Ambulatory Visit | Attending: Gastroenterology

## 2018-04-27 DIAGNOSIS — R112 Nausea with vomiting, unspecified: Secondary | ICD-10-CM | POA: Diagnosis not present

## 2018-04-27 DIAGNOSIS — Z1211 Encounter for screening for malignant neoplasm of colon: Secondary | ICD-10-CM | POA: Diagnosis present

## 2018-04-27 DIAGNOSIS — Z79899 Other long term (current) drug therapy: Secondary | ICD-10-CM | POA: Diagnosis not present

## 2018-04-27 DIAGNOSIS — F329 Major depressive disorder, single episode, unspecified: Secondary | ICD-10-CM | POA: Diagnosis not present

## 2018-04-27 DIAGNOSIS — Z881 Allergy status to other antibiotic agents status: Secondary | ICD-10-CM | POA: Insufficient documentation

## 2018-04-27 DIAGNOSIS — Z91011 Allergy to milk products: Secondary | ICD-10-CM | POA: Diagnosis not present

## 2018-04-27 DIAGNOSIS — Z8249 Family history of ischemic heart disease and other diseases of the circulatory system: Secondary | ICD-10-CM | POA: Insufficient documentation

## 2018-04-27 DIAGNOSIS — F419 Anxiety disorder, unspecified: Secondary | ICD-10-CM | POA: Insufficient documentation

## 2018-04-27 DIAGNOSIS — K2900 Acute gastritis without bleeding: Secondary | ICD-10-CM | POA: Diagnosis not present

## 2018-04-27 DIAGNOSIS — K2950 Unspecified chronic gastritis without bleeding: Secondary | ICD-10-CM | POA: Diagnosis not present

## 2018-04-27 DIAGNOSIS — Z888 Allergy status to other drugs, medicaments and biological substances status: Secondary | ICD-10-CM | POA: Insufficient documentation

## 2018-04-27 DIAGNOSIS — Z1231 Encounter for screening mammogram for malignant neoplasm of breast: Secondary | ICD-10-CM

## 2018-04-27 DIAGNOSIS — K449 Diaphragmatic hernia without obstruction or gangrene: Secondary | ICD-10-CM | POA: Insufficient documentation

## 2018-04-27 DIAGNOSIS — Z9884 Bariatric surgery status: Secondary | ICD-10-CM | POA: Insufficient documentation

## 2018-04-27 DIAGNOSIS — Z903 Acquired absence of stomach [part of]: Secondary | ICD-10-CM | POA: Diagnosis not present

## 2018-04-27 DIAGNOSIS — E559 Vitamin D deficiency, unspecified: Secondary | ICD-10-CM | POA: Insufficient documentation

## 2018-04-27 DIAGNOSIS — R1110 Vomiting, unspecified: Secondary | ICD-10-CM

## 2018-04-27 HISTORY — PX: ESOPHAGOGASTRODUODENOSCOPY (EGD) WITH PROPOFOL: SHX5813

## 2018-04-27 HISTORY — PX: POLYPECTOMY: SHX5525

## 2018-04-27 HISTORY — PX: COLONOSCOPY WITH PROPOFOL: SHX5780

## 2018-04-27 LAB — CBC
Hematocrit: 24.1 % — ABNORMAL LOW (ref 34.0–46.6)
Hemoglobin: 9.1 g/dL — ABNORMAL LOW (ref 11.1–15.9)
MCH: 28.8 pg (ref 26.6–33.0)
MCHC: 37.8 g/dL — ABNORMAL HIGH (ref 31.5–35.7)
MCV: 76 fL — ABNORMAL LOW (ref 79–97)
PLATELETS: 278 10*3/uL (ref 150–450)
RBC: 3.16 x10E6/uL — AB (ref 3.77–5.28)
RDW: 19.6 % — AB (ref 12.3–15.4)
WBC: 8.2 10*3/uL (ref 3.4–10.8)

## 2018-04-27 LAB — PANCREATIC ELASTASE, FECAL: PANCREATIC ELASTASE, FECAL: 487 ug Elast./g (ref >200)

## 2018-04-27 LAB — CALPROTECTIN, FECAL: Calprotectin, Fecal: <16 ug/g (ref 0–120)

## 2018-04-27 SURGERY — COLONOSCOPY WITH PROPOFOL
Anesthesia: General | Site: Throat

## 2018-04-27 MED ORDER — GLYCOPYRROLATE 0.2 MG/ML IJ SOLN
INTRAMUSCULAR | Status: DC | PRN
  Administered 2018-04-27: 0.2 mg via INTRAVENOUS

## 2018-04-27 MED ORDER — LIDOCAINE HCL (CARDIAC) PF 100 MG/5ML IV SOSY
PREFILLED_SYRINGE | INTRAVENOUS | Status: DC | PRN
  Administered 2018-04-27: 40 mg via INTRAVENOUS

## 2018-04-27 MED ORDER — LACTATED RINGERS IV SOLN
INTRAVENOUS | Status: DC
  Administered 2018-04-27: 08:00:00 via INTRAVENOUS

## 2018-04-27 MED ORDER — PROPOFOL 10 MG/ML IV BOLUS
INTRAVENOUS | Status: DC | PRN
  Administered 2018-04-27: 20 mg via INTRAVENOUS
  Administered 2018-04-27: 80 mg via INTRAVENOUS
  Administered 2018-04-27 (×7): 20 mg via INTRAVENOUS
  Administered 2018-04-27: 30 mg via INTRAVENOUS
  Administered 2018-04-27 (×2): 20 mg via INTRAVENOUS
  Administered 2018-04-27: 30 mg via INTRAVENOUS
  Administered 2018-04-27 (×2): 20 mg via INTRAVENOUS

## 2018-04-27 MED ORDER — STERILE WATER FOR IRRIGATION IR SOLN
Status: DC | PRN
  Administered 2018-04-27: 10:00:00

## 2018-04-27 SURGICAL SUPPLY — 7 items
BLOCK BITE 60FR ADLT L/F GRN (MISCELLANEOUS) ×3 IMPLANT
CANISTER SUCT 1200ML W/VALVE (MISCELLANEOUS) ×3 IMPLANT
FORCEPS BIOP RAD 4 LRG CAP 4 (CUTTING FORCEPS) ×1 IMPLANT
GOWN CVR UNV OPN BCK APRN NK (MISCELLANEOUS) ×4 IMPLANT
GOWN ISOL THUMB LOOP REG UNIV (MISCELLANEOUS) ×6
KIT ENDO PROCEDURE OLY (KITS) ×3 IMPLANT
WATER STERILE IRR 250ML POUR (IV SOLUTION) ×3 IMPLANT

## 2018-04-27 NOTE — Interval H&P Note (Signed)
History and Physical Interval Note:  04/27/2018 9:21 AM  Susan Strickland  has presented today for surgery, with the diagnosis of R10.9 Abdominal pain; Z12.11 Screening colonoscopy  The various methods of treatment have been discussed with the patient and family. After consideration of risks, benefits and other options for treatment, the patient has consented to  Procedure(s): COLONOSCOPY WITH PROPOFOL (N/A) ESOPHAGOGASTRODUODENOSCOPY (EGD) WITH PROPOFOL (N/A) as a surgical intervention .  [name] history has been reviewed, patient examined, no change in status, stable for surgery.  I have reviewed the patient's chart and labs.  Questions were answered to the patient's satisfaction.     [name]

## 2018-04-27 NOTE — Anesthesia Procedure Notes (Signed)
Performed by: Rangel Echeverri, CRNA Pre-anesthesia Checklist: Patient identified, Emergency Drugs available, Suction available, Timeout performed and Patient being monitored Patient Re-evaluated:Patient Re-evaluated prior to induction Oxygen Delivery Method: Nasal cannula Placement Confirmation: positive ETCO2       

## 2018-04-27 NOTE — Anesthesia Postprocedure Evaluation (Signed)
Anesthesia Post Note  Patient: Susan Strickland  Procedure(s) Performed: COLONOSCOPY WITH BIOPSIES (N/A Rectum) ESOPHAGOGASTRODUODENOSCOPY (EGD) WITH BIOPSIES (N/A Throat) POLYPECTOMY (N/A Rectum)  Patient location during evaluation: PACU Anesthesia Type: General Level of consciousness: awake and alert Pain management: pain level controlled Vital Signs Assessment: post-procedure vital signs reviewed and stable Respiratory status: spontaneous breathing Cardiovascular status: blood pressure returned to baseline Anesthetic complications: no    Jaci Standard, III,  Zulema Pulaski D

## 2018-04-27 NOTE — Op Note (Signed)
[facility] Gastroenterology Patient Name: Susan Strickland Procedure Date: 04/27/2018 9:21 AM MRN: [id number] Account #: [id number] Date of Birth: [birth year] Admit Type: Outpatient Age: 52 Room: [facility] OR ROOM 01 Gender: Female Note Status: Finalized Procedure:            Colonoscopy Indications:          Screening for colorectal malignant neoplasm Providers:            Lucilla Lame MD, MD Medicines:            Propofol per Anesthesia Complications:        No immediate complications. Procedure:            Pre-Anesthesia Assessment:                       - Prior to the procedure, a History and Physical was                        performed, and patient medications and allergies were                        reviewed. [name] tolerance of previous                        anesthesia was also reviewed. The risks and benefits of                        the procedure and the sedation options and risks were                        discussed with the patient. All questions were                        answered, and informed consent was obtained. Prior                        Anticoagulants: The patient has taken no previous                        anticoagulant or antiplatelet agents. ASA Grade                        Assessment: II - A patient with mild systemic disease.                        After reviewing the risks and benefits, the patient was                        deemed in satisfactory condition to undergo the                        procedure.                       After obtaining informed consent, the colonoscope was                        passed under direct vision. Throughout the procedure,                        the patient's blood pressure,  pulse, and oxygen                        saturations were monitored continuously. The                        Colonoscope was introduced through the anus and                        advanced to the the terminal ileum. The  colonoscopy was                        performed without difficulty. The patient tolerated the                        procedure well. The quality of the bowel preparation                        was excellent. Findings:      The perianal and digital rectal examinations were normal.      The terminal ileum appeared normal. Biopsies were taken with a cold       forceps for histology.      The colon (entire examined portion) appeared normal. Biopsies were taken       with a cold forceps for histology. Impression:           - The examined portion of the ileum was normal.                        Biopsied.                       - The entire examined colon is normal. Biopsied. Recommendation:       - Discharge patient to home.                       - Resume previous diet.                       - Continue present medications.                       - Await pathology results. Procedure Code(s):    --- Professional ---                       [phone], Colonoscopy, flexible; with biopsy, single or                        multiple Diagnosis Code(s):    --- Professional ---                       Z12.11, Encounter for screening for malignant neoplasm                        of colon CPT copyright 2018 American Medical Association. All rights reserved. The codes documented in this report are preliminary and upon coder review may  be revised to meet current compliance requirements. Lucilla Lame MD, MD 04/27/2018 9:53:05 AM This report has been signed electronically. Number of Addenda: 0 Note Initiated On: 04/27/2018 9:21 AM Scope Withdrawal Time: 0 hours 7 minutes 15 seconds  Total Procedure Duration: 0  hours 11 minutes 1 second       [facility]

## 2018-04-27 NOTE — Anesthesia Preprocedure Evaluation (Signed)
Anesthesia Evaluation  Patient identified by MRN, date of birth, ID band Patient awake    Reviewed: Allergy & Precautions, H&P , NPO status , Patient's Chart, lab work & pertinent test results  Airway Mallampati: I  TM Distance: >3 FB Neck ROM: full    Dental no notable dental hx.    Pulmonary neg pulmonary ROS,    Pulmonary exam normal breath sounds clear to auscultation       Cardiovascular negative cardio ROS Normal cardiovascular exam     Neuro/Psych Anxiety    GI/Hepatic negative GI ROS, Neg liver ROS,   Endo/Other  negative endocrine ROS  Renal/GU negative Renal ROS  negative genitourinary   Musculoskeletal   Abdominal   Peds  Hematology  (+) Blood dyscrasia, anemia ,   Anesthesia Other Findings   Reproductive/Obstetrics negative OB ROS                             Anesthesia Physical Anesthesia Plan  ASA: II  Anesthesia Plan: General   Post-op Pain Management:    Induction:   PONV Risk Score and Plan:   Airway Management Planned:   Additional Equipment:   Intra-op Plan:   Post-operative Plan:   Informed Consent: I have reviewed the patients History and Physical, chart, labs and discussed the procedure including the risks, benefits and alternatives for the proposed anesthesia with the patient or authorized representative who has indicated his/her understanding and acceptance.     Plan Discussed with:   Anesthesia Plan Comments:         Anesthesia Quick Evaluation

## 2018-04-27 NOTE — Op Note (Signed)
[facility] Gastroenterology Patient Name: Susan Strickland Procedure Date: 04/27/2018 9:23 AM MRN: [id number] Account #: [id number] Date of Birth: [birth year] Admit Type: Outpatient Age: 52 Room: [facility] OR ROOM 01 Gender: Female Note Status: Finalized Procedure:            Upper GI endoscopy Indications:          Nausea with vomiting Providers:            Lucilla Lame MD, MD Referring MD:         Olin Hauser (Referring MD) Medicines:            Propofol per Anesthesia Complications:        No immediate complications. Procedure:            Pre-Anesthesia Assessment:                       - Prior to the procedure, a History and Physical was                        performed, and patient medications and allergies were                        reviewed. [name] tolerance of previous                        anesthesia was also reviewed. The risks and benefits of                        the procedure and the sedation options and risks were                        discussed with the patient. All questions were                        answered, and informed consent was obtained. Prior                        Anticoagulants: The patient has taken no previous                        anticoagulant or antiplatelet agents. ASA Grade                        Assessment: II - A patient with mild systemic disease.                        After reviewing the risks and benefits, the patient was                        deemed in satisfactory condition to undergo the                        procedure.                       After obtaining informed consent, the endoscope was                        passed under direct vision. Throughout the procedure,  the patient's blood pressure, pulse, and oxygen                        saturations were monitored continuously. The was                        introduced through the mouth, and advanced to the     second part of duodenum. The upper GI endoscopy was                        accomplished without difficulty. The patient tolerated                        the procedure well. Findings:      A small hiatal hernia was present.      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      Localized mild inflammation characterized by erythema was found in the       gastric antrum. Biopsies were taken with a cold forceps for histology.      The examined duodenum was normal. Biopsies were taken with a cold       forceps for histology. Impression:           - Small hiatal hernia.                       - A sleeve gastrectomy was found.                       - Gastritis. Biopsied.                       - Normal examined duodenum. Biopsied. Recommendation:       - Discharge patient to home.                       - Resume previous diet.                       - Continue present medications.                       - Await pathology results.                       - Perform a colonoscopy today. Procedure Code(s):    --- Professional ---                       [phone], Esophagogastroduodenoscopy, flexible, transoral;                        with biopsy, single or multiple Diagnosis Code(s):    --- Professional ---                       R11.2, Nausea with vomiting, unspecified                       K29.70, Gastritis, unspecified, without bleeding CPT copyright 2018 American Medical Association. All rights reserved. The codes documented in this report are preliminary and upon coder review may  be revised to meet current compliance requirements. Lucilla Lame MD, MD 04/27/2018 9:39:28 AM  This report has been signed electronically. Number of Addenda: 0 Note Initiated On: 04/27/2018 9:23 AM      [facility]

## 2018-04-27 NOTE — Transfer of Care (Signed)
Immediate Anesthesia Transfer of Care Note  Patient: Susan Strickland  Procedure(s) Performed: COLONOSCOPY WITH BIOPSIES (N/A Rectum) ESOPHAGOGASTRODUODENOSCOPY (EGD) WITH BIOPSIES (N/A Throat) POLYPECTOMY (N/A Rectum)  Patient Location: PACU  Anesthesia Type: General  Level of Consciousness: awake, alert  and patient cooperative  Airway and Oxygen Therapy: Patient Spontanous Breathing and Patient connected to supplemental oxygen  Post-op Assessment: Post-op Vital signs reviewed, Patient's Cardiovascular Status Stable, Respiratory Function Stable, Patent Airway and No signs of Nausea or vomiting  Post-op Vital Signs: Reviewed and stable  Complications: No apparent anesthesia complications

## 2018-05-01 ENCOUNTER — Encounter: Payer: Self-pay | Admitting: Gastroenterology

## 2018-05-02 ENCOUNTER — Ambulatory Visit: Admit: 2018-05-02 | Payer: BC Managed Care – PPO | Admitting: Gastroenterology

## 2018-05-02 SURGERY — COLONOSCOPY WITH PROPOFOL
Anesthesia: General

## 2018-05-22 ENCOUNTER — Telehealth: Payer: Self-pay

## 2018-05-22 NOTE — Telephone Encounter (Signed)
-----   Message from Lucilla Lame, MD sent at 05/02/2018  7:56 AM EDT ----- Please have the patient come in for a follow up.

## 2018-05-22 NOTE — Telephone Encounter (Signed)
LVM for pt to return my call.

## 2018-05-25 ENCOUNTER — Telehealth: Payer: Self-pay

## 2018-05-25 NOTE — Telephone Encounter (Signed)
LVM again for pt to return my call.  

## 2018-05-25 NOTE — Telephone Encounter (Signed)
-----   Message from Lucilla Lame, MD sent at 05/02/2018  7:56 AM EDT ----- Please have the patient come in for a follow up.

## 2018-05-25 NOTE — Telephone Encounter (Signed)
Pt scheduled for a follow up appt on Monday 05/29/18.

## 2018-05-29 ENCOUNTER — Ambulatory Visit (INDEPENDENT_AMBULATORY_CARE_PROVIDER_SITE_OTHER): Payer: BC Managed Care – PPO | Admitting: Gastroenterology

## 2018-05-29 ENCOUNTER — Encounter: Payer: Self-pay | Admitting: Gastroenterology

## 2018-05-29 VITALS — BP 115/68 | HR 86 | Ht 60.0 in | Wt 206.0 lb

## 2018-05-29 DIAGNOSIS — R1013 Epigastric pain: Secondary | ICD-10-CM

## 2018-05-29 NOTE — Progress Notes (Signed)
Primary Care Physician: Olin Hauser, DO  Primary Gastroenterologist:  Dr. Lucilla Lame  Chief Complaint  Patient presents with  . Follow up procedure results    HPI: Susan Strickland is a 52 y.o. female here for follow-up after having an EGD and colonoscopy.  The patient was having the colonoscopy for screening purposes in the EGD was for nausea and vomiting.  The patient was found to have findings consistent with a sleeve gastrectomy.  The patient also had biopsies of the stomach that showed her to have chronic in active gastritis.  Small intestinal biopsies including the duodenum and terminal ileum in addition to random colon biopsies were all normal and did not show any signs of inflammation or any cause for any change in bowel habits.  Current Outpatient Medications  Medication Sig Dispense Refill  . Ascorbic Acid (VITAMIN C) 1000 MG tablet Take by mouth.    . Cholecalciferol (VITAMIN D-1000 MAX ST) 1000 units tablet Take by mouth.    . Multiple Vitamin (MULTI-VITAMINS) TABS Take by mouth.    . sertraline (ZOLOFT) 100 MG tablet Take 1 tablet (100 mg total) by mouth daily. 30 tablet 11  . baclofen (LIORESAL) 10 MG tablet Take 0.5-1 tablets (5-10 mg total) by mouth 3 (three) times daily as needed for muscle spasms. (Patient not taking: Reported on 04/24/2018) 30 each 1  . dicyclomine (BENTYL) 10 MG capsule Take 1 capsule (10 mg total) by mouth 4 (four) times daily -  before meals and at bedtime. (Patient not taking: Reported on 04/24/2018) 40 capsule 0  . omeprazole (PRILOSEC) 40 MG capsule Take 1 capsule (40 mg total) by mouth daily before breakfast. (Patient not taking: Reported on 04/24/2018) 30 capsule 2  . ondansetron (ZOFRAN ODT) 4 MG disintegrating tablet Take 1 tablet (4 mg total) by mouth every 8 (eight) hours as needed for nausea or vomiting. (Patient not taking: Reported on 04/24/2018) 30 tablet 0  . sucralfate (CARAFATE) 1 g tablet TAKE 1 TABLET(1 GRAM) BY MOUTH  FOUR TIMES DAILY AT BEDTIME WITH MEALS (Patient not taking: TAKE 1 TABLET(1 GRAM) BY MOUTH FOUR TIMES DAILY AT BEDTIME WITH MEALS) 360 tablet 0   No current facility-administered medications for this visit.     Allergies as of 05/29/2018 - Review Complete 04/27/2018  Allergen Reaction Noted  . Lac bovis Nausea And Vomiting 10/21/2015  . Doxycycline Nausea Only 12/03/2016  . Milk-related compounds Nausea And Vomiting 10/21/2015  . Sweetness enhancer  04/24/2018    ROS:  General: Negative for anorexia, weight loss, fever, chills, fatigue, weakness. ENT: Negative for hoarseness, difficulty swallowing , nasal congestion. CV: Negative for chest pain, angina, palpitations, dyspnea on exertion, peripheral edema.  Respiratory: Negative for dyspnea at rest, dyspnea on exertion, cough, sputum, wheezing.  GI: See history of present illness. GU:  Negative for dysuria, hematuria, urinary incontinence, urinary frequency, nocturnal urination.  Endo: Negative for unusual weight change.    Physical Examination:   BP 115/68   Pulse 86   Ht 5' (1.524 m)   Wt 206 lb (93.4 kg)   BMI 40.23 kg/m   General: Well-nourished, well-developed in no acute distress.  Eyes: No icterus. Conjunctivae pink. Extremities: No lower extremity edema. No clubbing or deformities. Neuro: Alert and oriented x 3.  Grossly intact. Skin: Warm and dry, no jaundice.   Psych: Alert and cooperative, normal mood and affect.  Labs:    Imaging Studies: No results found.  Assessment and Plan:   Susan Espiritu  Strickland is a 52 y.o. y/o female who comes for follow-up after having an EGD and colonoscopy.  The patient had biopsies of the stomach small bowel terminal ileum and random colon biopsies.  The only biopsies that showed any abnormality was her stomach which showed chronic inactive gastritis.  The patient states that she went on a brat diet and her symptoms resolved.  She is now adding food slowly to see which foods make her  sick.  She has been doing well since having the procedures.  The patient has been informed of her results and will follow-up as needed.    Lucilla Lame, MD. Marval Regal   Note: This dictation was prepared with Dragon dictation along with smaller phrase technology. Any transcriptional errors that result from this process are unintentional.

## 2018-06-03 ENCOUNTER — Other Ambulatory Visit: Payer: Self-pay | Admitting: Family Medicine

## 2018-06-03 DIAGNOSIS — K219 Gastro-esophageal reflux disease without esophagitis: Secondary | ICD-10-CM

## 2018-06-15 ENCOUNTER — Ambulatory Visit (INDEPENDENT_AMBULATORY_CARE_PROVIDER_SITE_OTHER): Payer: BC Managed Care – PPO | Admitting: General Surgery

## 2018-06-15 ENCOUNTER — Other Ambulatory Visit: Payer: Self-pay

## 2018-06-15 ENCOUNTER — Encounter: Payer: Self-pay | Admitting: General Surgery

## 2018-06-15 VITALS — BP 148/88 | HR 91 | Resp 12 | Ht 60.0 in | Wt 207.0 lb

## 2018-06-15 DIAGNOSIS — Z1231 Encounter for screening mammogram for malignant neoplasm of breast: Secondary | ICD-10-CM | POA: Diagnosis not present

## 2018-06-15 NOTE — Patient Instructions (Addendum)
Patient will be asked to return to her primary care for her annual bilateral screening mammograms.  Check with her mother to see if she had genetic testing for BRCA.

## 2018-06-15 NOTE — Progress Notes (Signed)
Patient ID: Susan Strickland, female   DOB: 1965-08-21, 52 y.o.   MRN: [id number]  Chief Complaint  Patient presents with  . Follow-up    HPI Susan Strickland is a 52 y.o. female who presents for a breast evaluation, former patient of Dr Jamal Collin. The most recent mammogram was done on 05/29/2018.  Patient does perform regular self breast checks and gets regular mammograms done. No new breast issues.  She is a Pharmacist, hospital at [employer].  HPI  Past Medical History:  Diagnosis Date  . Anxiety   . Breast cyst   . Degenerative disc disease, lumbar   . Depression   . History of mammogram 05/2012; 09/24/2014   wnl per pt; benign  . History of Papanicolaou smear of cervix 08/15/2012   -/-  . Hyperlipemia   . Vitamin D deficiency     Past Surgical History:  Procedure Laterality Date  . BACK SURGERY     L5  . BREAST BIOPSY Left 2016   NEG  . COLONOSCOPY WITH PROPOFOL N/A 04/27/2018   Procedure: COLONOSCOPY WITH BIOPSIES;  Surgeon: Lucilla Lame, MD;  Location: [place];  Service: Endoscopy;  Laterality: N/A;  . ESOPHAGOGASTRODUODENOSCOPY (EGD) WITH PROPOFOL N/A 04/27/2018   Procedure: ESOPHAGOGASTRODUODENOSCOPY (EGD) WITH BIOPSIES;  Surgeon: Lucilla Lame, MD;  Location: [place];  Service: Endoscopy;  Laterality: N/A;  . 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   Marland Kitchen POLYPECTOMY N/A 04/27/2018   Procedure: POLYPECTOMY;  Surgeon: Lucilla Lame, MD;  Location: [place];  Service: Endoscopy;  Laterality: N/A;    Family History  Problem Relation Age of Onset  . Breast cancer Mother 26  . Hypertension Father   . Kidney disease Father   . Diabetes Father        type 2  . Heart disease Father     Social History Social History   Tobacco Use  . Smoking status: Never Smoker  . Smokeless tobacco: Never Used  Substance Use Topics  . Alcohol use: No  . Drug use: No    Allergies  Allergen Reactions  . Lac Bovis Nausea And Vomiting       .  Doxycycline Nausea Only    Hot flashes, vivid dreams  . Milk-Related Compounds Nausea And Vomiting       . Sweetness Enhancer     headaches    Current Outpatient Medications  Medication Sig Dispense Refill  . Ascorbic Acid (VITAMIN C) 1000 MG tablet Take by mouth.    . Cholecalciferol (VITAMIN D-1000 MAX ST) 1000 units tablet Take by mouth.    . Multiple Vitamin (MULTI-VITAMINS) TABS Take by mouth.    Marland Kitchen omeprazole (PRILOSEC) 40 MG capsule TAKE 1 CAPSULE(40 MG) BY MOUTH DAILY BEFORE BREAKFAST 30 capsule 0  . ondansetron (ZOFRAN ODT) 4 MG disintegrating tablet Take 1 tablet (4 mg total) by mouth every 8 (eight) hours as needed for nausea or vomiting. 30 tablet 0  . sertraline (ZOLOFT) 100 MG tablet Take 1 tablet (100 mg total) by mouth daily. 30 tablet 11   No current facility-administered medications for this visit.     Review of Systems Review of Systems  Constitutional: Negative.   Respiratory: Negative.   Cardiovascular: Negative.     Blood pressure (!) 148/88, pulse 91, resp. rate 12, height 5' (1.524 m), weight 207 lb (93.9 kg), last menstrual period 05/25/2018, SpO2 99 %.  Physical Exam Physical Exam  Constitutional: She is oriented to person, place, and  time. She appears well-developed and well-nourished.  HENT:  Mouth/Throat: Oropharynx is clear and moist.  Eyes: Conjunctivae are normal. No scleral icterus.  Neck: Neck supple.  Cardiovascular: Normal rate, regular rhythm and normal heart sounds.  Pulmonary/Chest: Effort normal and breath sounds normal. Right breast exhibits no inverted nipple, no mass, no nipple discharge, no skin change and no tenderness. Left breast exhibits no inverted nipple, no mass, no nipple discharge, no skin change and no tenderness.  Lymphadenopathy:    She has no cervical adenopathy.    She has no axillary adenopathy.  Neurological: She is alert and oriented to person, place, and time.  Skin: Skin is warm and dry.  Psychiatric: Her  behavior is normal.    Data Reviewed December 29, 2017 bilateral screening mammograms were reviewed.  BI-RADS-1.  Assessment    Benign breast exam.    Plan  Check with her mother to see if she had genetic testing for BRCA.  (History of breast cancer age 33)  Patient will be asked to return to her primary care for her annual bilateral screening mammograms, due in June 2020.Marland Kitchen   HPI, Physical Exam, Assessment and Plan have been scribed under the direction and in the presence of Robert Bellow, MD. Karie Fetch, RN  HPI, Physical Exam, Assessment and Plan have been scribed under the direction and in the presence of Hervey Ard, MD.  Gaspar Cola, CMA  I have completed the exam and reviewed the above documentation for accuracy and completeness.  I agree with the above.  Haematologist has been used and any errors in dictation or transcription are unintentional.  Hervey Ard, M.D., F.A.C.S.  Susan Strickland 06/15/2018, 4:28 PM

## 2018-08-09 ENCOUNTER — Ambulatory Visit (INDEPENDENT_AMBULATORY_CARE_PROVIDER_SITE_OTHER): Payer: BC Managed Care – PPO | Admitting: Family Medicine

## 2018-08-09 ENCOUNTER — Encounter: Payer: Self-pay | Admitting: Family Medicine

## 2018-08-09 VITALS — BP 123/71 | HR 86 | Temp 98.0°F | Resp 16 | Ht 60.0 in | Wt 212.0 lb

## 2018-08-09 DIAGNOSIS — M79672 Pain in left foot: Secondary | ICD-10-CM | POA: Diagnosis not present

## 2018-08-09 DIAGNOSIS — G5762 Lesion of plantar nerve, left lower limb: Secondary | ICD-10-CM

## 2018-08-09 NOTE — Progress Notes (Signed)
Subjective:    Patient ID: Susan Strickland, female    DOB: 13-Aug-1965, 53 y.o.   MRN: [id number]  Susan Strickland is a 53 y.o. female presenting on 08/09/2018 for Foot Swelling (Left pain onset 3 weeks gotten worst last week)   HPI   Left Forefoot Pain / Suspected Morton's Neuroma - Reports symptoms for past 3 weeks with swelling generalized and pain with ambulation worse, has some minor discomfort with movement, at rest without weight bearing or movement not painful. Pain is significantly worse the longer she is on her feet, end of the day. - Tried Tylenol 325mg  x 2 per dose as needed, not regularly - No new exercise regimen. She does run for exercise previously run about 5 days a week, usually runs asphalt and treadmill x 3 days week, has this regimen for >3 years without problem. No prior fracture or foot injury. Never seen Podiatry - Denies redness, fevers, chills, other joint pain or swelling, injury, fall, numbness or tingling  Additional Family history, her oldest daughter had a provoked serious PE due to recent leg fracture, she has risk factor with obesity, she tried anticoagulant eliquis, and ultimately switched to alternative medicine.   Depression screen [facility] 2/9 08/09/2018 03/10/2018 02/24/2018  Decreased Interest 0 0 0  Down, Depressed, Hopeless 0 0 0  PHQ - 2 Score 0 0 0  Altered sleeping - - -  Tired, decreased energy - - -  Change in appetite - - -  Feeling bad or failure about yourself  - - -  Trouble concentrating - - -  Moving slowly or fidgety/restless - - -  Suicidal thoughts - - -  PHQ-9 Score - - -  Difficult doing work/chores - - -    Social History   Tobacco Use  . Smoking status: Never Smoker  . Smokeless tobacco: Never Used  Substance Use Topics  . Alcohol use: No  . Drug use: No    Review of Systems Per HPI unless specifically indicated above     Objective:    BP 123/71   Pulse 86   Temp 98 F (36.7 C) (Oral)   Resp 16   Ht 5' (1.524  m)   Wt 212 lb (96.2 kg)   BMI 41.40 kg/m   Wt Readings from Last 3 Encounters:  08/09/18 212 lb (96.2 kg)  06/15/18 207 lb (93.9 kg)  05/29/18 206 lb (93.4 kg)    Physical Exam Vitals signs and nursing note reviewed.  Constitutional:      General: She is not in acute distress.    Appearance: She is well-developed. She is not diaphoretic.     Comments: Well-appearing, comfortable, cooperative  HENT:     Head: Normocephalic and atraumatic.  Eyes:     General:        Right eye: No discharge.        Left eye: No discharge.     Conjunctiva/sclera: Conjunctivae normal.  Cardiovascular:     Rate and Rhythm: Normal rate.  Pulmonary:     Effort: Pulmonary effort is normal.  Musculoskeletal:     Comments: Feet Appearance of L foot has some edema localized soft with possible deeper palpable nodular density plantar 2nd-3rd toe forefoot, mild tender, no other abnormality, has slightly higher arch on L foot than R. Has some splaying of 2nd and 3rd toes in neutral position at rest and weightbearing. No bony tenderness in foot ankle. No heel pain.  Skin:  General: Skin is warm and dry.     Findings: No erythema or rash.  Neurological:     Mental Status: She is alert and oriented to person, place, and time.  Psychiatric:        Behavior: Behavior normal.     Comments: Well groomed, good eye contact, normal speech and thoughts    Results for orders placed or performed in visit on 04/17/18  Calprotectin, Fecal  Result Value Ref Range   Calprotectin, Fecal <16 0 - 120 ug/g  Pancreatic elastase, fecal  Result Value Ref Range   Pancreatic Elastase, Fecal 487 >200 ug Elast./g  CBC  Result Value Ref Range   WBC 8.2 3.4 - 10.8 x10E3/uL   RBC 3.16 (L) 3.77 - 5.28 x10E6/uL   Hemoglobin 9.1 (L) 11.1 - 15.9 g/dL   Hematocrit 24.1 (L) 34.0 - 46.6 %   MCV 76 (L) 79 - 97 fL   MCH 28.8 26.6 - 33.0 pg   MCHC 37.8 (H) 31.5 - 35.7 g/dL   RDW 19.6 (H) 12.3 - 15.4 %   Platelets 278 150 - 450  x10E3/uL   Hematology Comments: Note:       Assessment & Plan:   Problem List Items Addressed This Visit    None    Visit Diagnoses    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  No orders of the defined types were placed in this encounter.   Orders Placed This Encounter  Procedures  . Ambulatory referral to Podiatry    Referral Priority:   Routine    Referral Type:   Consultation    Referral Reason:   Specialty Services Required    Requested Specialty:   Podiatry    Number of Visits Requested:   1    Follow up plan: Return in about 6 weeks (around 09/20/2018), or if symptoms worsen or fail to improve, for foot pain .  Nobie Putnam, [facility] 08/09/2018, 4:11 PM

## 2018-08-09 NOTE — Patient Instructions (Addendum)
Thank you for coming to the office today.  Stay tuned for apt  [facility] Address: [address], [place], [place] [address] Hours: Open 8AM-5PM Phone: [phone]  We can consider Sports Med in [place] in future - may need ultrasound.   Morton Neuralgia  Morton neuralgia is foot pain that affects the ball of the foot and the area near the toes. Morton neuralgia occurs when part of a nerve in the foot (digital nerve) is under too much pressure (compressed). When this happens over a long period of time, the nerve can thicken (neuroma) and cause pain. Pain usually occurs between the third and fourth toes.  Morton neuralgia can come and go but may get worse over time. What are the causes? This condition is caused by doing the same things over and over with your foot, such as:  Activities such as running or jumping.  Wearing shoes that are too tight. What increases the risk? You may be at higher risk for Morton neuralgia if you:  Are female.  Wear high heels.  Wear shoes that are narrow or tight.  Do activities that repeatedly stretch your toes, such as: ? Running. ? [place]. ? Long-distance walking. What are the signs or symptoms? The first symptom of Morton neuralgia is pain that spreads from the ball of the foot to the toes. It may feel like you are walking on a marble. Pain usually gets worse with walking and goes away at night. Other symptoms may include numbness and cramping of your toes. Both feet are equally affected, but rarely at the same time. How is this diagnosed? This condition is diagnosed based on your symptoms, your medical history, and a physical exam. Your health care provider may:  Squeeze your foot just behind your toe.  Ask you to move your toes to check for pain.  Ask about your physical activity level. You also may have imaging tests, such as an X-ray, ultrasound, or MRI. How is this treated? Treatment depends on how severe your  condition is and what causes it. Treatment may involve:  Wearing different shoes that are not too tight, are low-heeled, and provide good support. For some people, this is the only treatment needed.  Wearing an over-the-counter or custom supportive pad (orthotic) under the front of your foot.  Getting injections of numbing medicine and anti-inflammatory medicine (steroid) in the nerve.  Having surgery to remove part of the thickened nerve. Follow these instructions at home: Managing pain, stiffness, and swelling   Massage your foot as needed.  Wear orthotics as told by your health care provider.  If directed, put ice on your foot: ? Put ice in a plastic bag. ? Place a towel between your skin and the bag. ? Leave the ice on for 20 minutes, 2-3 times a day.  Avoid activities that cause pain or make pain worse. If you play sports, ask your health care provider when it is safe for you to return to sports.  Raise (elevate) your foot above the level of your heart while lying down and, when possible, while sitting. General instructions  Take over-the-counter and prescription medicines only as told by your health care provider.  Do not drive or use heavy machinery while taking prescription pain medicine.  Wear shoes that: ? Have soft soles. ? Have a wide toe area. ? Provide arch support. ? Do not pinch or squeeze your feet. ? Have room for your orthotics, if applicable.  Keep all follow-up visits  as told by your health care provider. This is important. Contact a health care provider if:  Your symptoms get worse or do not get better with treatment and home care. Summary  Morton neuralgia is foot pain that affects the ball of the foot and the area near the toes. Pain usually occurs between the third and fourth toes, gets worse with walking, and goes away at night.  Morton neuralgia occurs when part of a nerve in the foot (digital nerve) is under too much pressure. When this  happens over a long period of time, the nerve can thicken (neuroma) and cause pain.  This condition is caused by doing the same things over and over with your foot, such as running or jumping, wearing shoes that are too tight, or wearing high heels.  Treatment may involve wearing low-heeled shoes that are not too tight, wearing a supportive pad (orthotic) under the front of your foot, getting injections in the nerve, or having surgery to remove part of the thickened nerve. This information is not intended to replace advice given to you by your health care provider. Make sure you discuss any questions you have with your health care provider. Document Released: 10/04/2000 Document Revised: 07/12/2017 Document Reviewed: 07/12/2017 Elsevier Interactive Patient Education  2019 [employer].     Please schedule a Follow-up Appointment to: Return in about 6 weeks (around 09/20/2018), or if symptoms worsen or fail to improve, for foot pain .  If you have any other questions or concerns, please feel free to call the office or send a message through [place]. You may also schedule an earlier appointment if necessary.  Additionally, you may be receiving a survey about your experience at our office within a few days to 1 week by e-mail or mail. We value your feedback.  Nobie Putnam, DO [place]

## 2018-08-30 ENCOUNTER — Ambulatory Visit (INDEPENDENT_AMBULATORY_CARE_PROVIDER_SITE_OTHER): Payer: BC Managed Care – PPO | Admitting: Podiatry

## 2018-08-30 ENCOUNTER — Encounter: Payer: Self-pay | Admitting: Podiatry

## 2018-08-30 ENCOUNTER — Other Ambulatory Visit: Payer: Self-pay | Admitting: Podiatry

## 2018-08-30 ENCOUNTER — Ambulatory Visit (INDEPENDENT_AMBULATORY_CARE_PROVIDER_SITE_OTHER): Payer: BC Managed Care – PPO

## 2018-08-30 VITALS — BP 121/86 | HR 99 | Resp 16

## 2018-08-30 DIAGNOSIS — M779 Enthesopathy, unspecified: Secondary | ICD-10-CM

## 2018-08-30 DIAGNOSIS — G5762 Lesion of plantar nerve, left lower limb: Secondary | ICD-10-CM

## 2018-08-30 DIAGNOSIS — M7752 Other enthesopathy of left foot: Secondary | ICD-10-CM | POA: Diagnosis not present

## 2018-08-30 DIAGNOSIS — L603 Nail dystrophy: Secondary | ICD-10-CM

## 2018-08-30 DIAGNOSIS — M778 Other enthesopathies, not elsewhere classified: Secondary | ICD-10-CM

## 2018-08-30 MED ORDER — METHYLPREDNISOLONE 4 MG PO TBPK: ORAL_TABLET | ORAL | 0 refills | Status: DC

## 2018-08-30 MED ORDER — MELOXICAM 15 MG PO TABS: 15.0000 mg | ORAL_TABLET | Freq: Every day | ORAL | 3 refills | Status: DC

## 2018-08-30 NOTE — Progress Notes (Signed)
Subjective:  Patient ID: Susan Strickland, female    DOB: May 24, 1966,  MRN: [id number] HPI Chief Complaint  Patient presents with  . Foot Pain    Plantar forefoot left - aching x 1 month, feels lump, no treatment  . Nail Problem    Toenails - 1st and 3rd nail right - thick and discolored x years, no treatment  . New Patient (Initial Visit)    53 y.o. female presents with the above complaint.   ROS: Denies fever chills nausea vomiting muscle aches pains calf pain back pain chest pain shortness of breath.  Past Medical History:  Diagnosis Date  . Anxiety   . Breast cyst   . Degenerative disc disease, lumbar   . Depression   . History of mammogram 05/2012; 09/24/2014   wnl per pt; benign  . History of Papanicolaou smear of cervix 08/15/2012   -/-  . Hyperlipemia   . Vitamin D deficiency    Past Surgical History:  Procedure Laterality Date  . BACK SURGERY     L5  . BREAST BIOPSY Left 2016   NEG  . COLONOSCOPY WITH PROPOFOL N/A 04/27/2018   Procedure: COLONOSCOPY WITH BIOPSIES;  Surgeon: Lucilla Lame, MD;  Location: [facility];  Service: Endoscopy;  Laterality: N/A;  . ESOPHAGOGASTRODUODENOSCOPY (EGD) WITH PROPOFOL N/A 04/27/2018   Procedure: ESOPHAGOGASTRODUODENOSCOPY (EGD) WITH BIOPSIES;  Surgeon: Lucilla Lame, MD;  Location: [facility];  Service: Endoscopy;  Laterality: N/A;  . 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   Marland Kitchen POLYPECTOMY N/A 04/27/2018   Procedure: POLYPECTOMY;  Surgeon: Lucilla Lame, MD;  Location: [facility];  Service: Endoscopy;  Laterality: N/A;    Current Outpatient Medications:  .  Ascorbic Acid (VITAMIN C) 1000 MG tablet, Take by mouth., Disp: , Rfl:  .  Cholecalciferol (VITAMIN D-[address]) 1000 units tablet, Take by mouth., Disp: , Rfl:  .  meloxicam (MOBIC) 15 MG tablet, Take 1 tablet (15 mg total) by mouth daily., Disp: 30 tablet, Rfl: 3 .  methylPREDNISolone (MEDROL DOSEPAK) 4 MG TBPK tablet, 6 day dose pack - take  as directed, Disp: 21 tablet, Rfl: 0 .  Multiple Vitamin (MULTI-VITAMINS) TABS, Take by mouth., Disp: , Rfl:  .  sertraline (ZOLOFT) 100 MG tablet, Take 1 tablet (100 mg total) by mouth daily., Disp: 30 tablet, Rfl: 11  Allergies  Allergen Reactions  . Lac Bovis Nausea And Vomiting       . Doxycycline Nausea Only    Hot flashes, vivid dreams  . Milk-Related Compounds Nausea And Vomiting       . Sweetness Enhancer     headaches   Review of Systems Objective:   Vitals:   08/30/18 1515  BP: 121/86  Pulse: 99  Resp: 16    General: Well developed, nourished, in no acute distress, alert and oriented x3   Dermatological: Skin is warm, dry and supple bilateral. Nails x 10 are well maintained; remaining integument appears unremarkable at this time. There are no open sores, no preulcerative lesions, no rash or signs of infection present.  Vascular: Dorsalis Pedis artery and Posterior Tibial artery pedal pulses are 2/4 bilateral with immedate capillary fill time. Pedal hair growth present. No varicosities and no lower extremity edema present bilateral.   Neruologic: Grossly intact via light touch bilateral. Vibratory intact via tuning fork bilateral. Protective threshold with Semmes Wienstein monofilament intact to all pedal sites bilateral. Patellar and Achilles deep tendon reflexes 2+ bilateral. No Babinski or clonus  noted bilateral.   Musculoskeletal: No gross boney pedal deformities bilateral. No pain, crepitus, or limitation noted with foot and ankle range of motion bilateral. Muscular strength 5/5 in all groups tested bilateral.  She has pain on palpation and range of motion second tarsal phalangeal joint left foot.  Gait: Unassisted, Nonantalgic.    Radiographs:  Radiographs taken today demonstrate elongated second metatarsal with an elevated second toe more than likely capsulitis.  Otherwise no acute findings.  Assessment & Plan:   Assessment: Capsulitis second metatarsal  phalangeal joint left and nail dystrophy hallux and third digit right foot.  Plan: At this point I debrided samples from the first and third toes to be sent for pathologic evaluation.  And I injected around the second metatarsal phalangeal joint with 20 mg Kenalog 5 mg Marcaine and sterile Betadine skin prep.     Aracelys Glade T. [place], [place]

## 2018-09-27 ENCOUNTER — Other Ambulatory Visit: Payer: BC Managed Care – PPO | Admitting: Orthotics

## 2018-09-27 ENCOUNTER — Ambulatory Visit (INDEPENDENT_AMBULATORY_CARE_PROVIDER_SITE_OTHER): Payer: BC Managed Care – PPO | Admitting: Podiatry

## 2018-09-27 ENCOUNTER — Encounter: Payer: Self-pay | Admitting: Podiatry

## 2018-09-27 ENCOUNTER — Other Ambulatory Visit: Payer: Self-pay

## 2018-09-27 DIAGNOSIS — L603 Nail dystrophy: Secondary | ICD-10-CM | POA: Diagnosis not present

## 2018-09-27 DIAGNOSIS — Z79899 Other long term (current) drug therapy: Secondary | ICD-10-CM | POA: Diagnosis not present

## 2018-09-27 MED ORDER — TERBINAFINE HCL 250 MG PO TABS: 250.0000 mg | ORAL_TABLET | Freq: Every day | ORAL | 0 refills | Status: DC

## 2018-09-27 NOTE — Progress Notes (Signed)
She presents today to discuss the results of her pathology regarding her toenails.  Objective: Vital signs are stable she alert oriented x3.  No change in the nail plates.  She has a nail dystrophy and dermatophytes per pathology result.  Assessment: Onychomycosis and nail dystrophy.  Plan: Discussed in great detail today the pros and cons of laser therapy versus oral therapy.  At this point she like to try oral therapy.  Discussed the possible liver implications and side effects.  She understands and is amenable to it dispensed a prescription for blood work today consisting of a protein hepatic prep profile and also dispensed a prescription for 30 Lamisil tablets 250 mg tablets 1 p.o. daily follow-up with me in 1 month watch for signs and symptoms of allergic reactions and will notify me if there are any.

## 2018-09-28 ENCOUNTER — Telehealth: Payer: Self-pay | Admitting: Family Medicine

## 2018-09-28 ENCOUNTER — Telehealth: Payer: BC Managed Care – PPO | Admitting: Family

## 2018-09-28 DIAGNOSIS — J329 Chronic sinusitis, unspecified: Secondary | ICD-10-CM | POA: Diagnosis not present

## 2018-09-28 DIAGNOSIS — J029 Acute pharyngitis, unspecified: Secondary | ICD-10-CM

## 2018-09-28 DIAGNOSIS — B9789 Other viral agents as the cause of diseases classified elsewhere: Secondary | ICD-10-CM

## 2018-09-28 LAB — HEPATIC FUNCTION PANEL
ALBUMIN: 4.2 g/dL (ref 3.8–4.9)
ALK PHOS: 63 IU/L (ref 39–117)
ALT: 16 IU/L (ref 0–32)
AST: 22 IU/L (ref 0–40)
BILIRUBIN, DIRECT: 0.09 mg/dL (ref 0.00–0.40)
Bilirubin Total: 0.3 mg/dL (ref 0.0–1.2)
TOTAL PROTEIN: 6.8 g/dL (ref 6.0–8.5)

## 2018-09-28 MED ORDER — FLUTICASONE PROPIONATE 50 MCG/ACT NA SUSP: 1.0000 | Freq: Two times a day (BID) | NASAL | 6 refills | Status: AC

## 2018-09-28 MED ORDER — BENZONATATE 100 MG PO CAPS: 100.0000 mg | ORAL_CAPSULE | Freq: Three times a day (TID) | ORAL | 0 refills | Status: DC | PRN

## 2018-09-28 NOTE — Progress Notes (Signed)
Greater than 5 minutes, yet less than 10 minutes of time have been spent researching, coordinating, and implementing care for this patient today.  Thank you for the details you included in the comment boxes. Those details are very helpful in determining the best course of treatment for you and help [place] to provide the best care.   We are sorry that you are not feeling well.  Here is how we plan to help!  Your symptoms indicate a likely viral infection (Pharyngitis).   Pharyngitis is inflammation in the back of the throat which can cause a sore throat, scratchiness and sometimes difficulty swallowing.   Pharyngitis is typically caused by a respiratory virus and will just run its course.  Please keep in mind that your symptoms could last up to 10 days.  For throat pain, we recommend over the counter oral pain relief medications such as acetaminophen or aspirin, or anti-inflammatory medications such as ibuprofen or naproxen sodium.  Topical treatments such as oral throat lozenges or sprays may be used as needed.  Avoid close contact with loved ones, especially the very young and elderly.  Remember to wash your hands thoroughly throughout the day as this is the number one way to prevent the spread of infection and wipe down door knobs and counters with disinfectant.  I have also sent Tessalon Perles 100mg , take 1-2 every 8 hours as needed for cough. I have also sent flonase, 2 sprays daily in each nare for your sinuses.   After careful review of your answers, I would not recommend and antibiotic for your condition.  Antibiotics should not be used to treat conditions that we suspect are caused by viruses like the virus that causes the common cold or flu. However, some people can have Strep with atypical symptoms. You may need formal testing in clinic or office to confirm if your symptoms continue or worsen.  Providers prescribe antibiotics to treat infections caused by bacteria. Antibiotics are very powerful  in treating bacterial infections when they are used properly.  To maintain their effectiveness, they should be used only when necessary.  Overuse of antibiotics has resulted in the development of super bugs that are resistant to treatment!    Home Care:  Only take medications as instructed by your medical team.  Do not drink alcohol while taking these medications.  A steam or ultrasonic humidifier can help congestion.  You can place a towel over your head and breathe in the steam from hot water coming from a faucet.  Avoid close contacts especially the very young and the elderly.  Cover your mouth when you cough or sneeze.  Always remember to wash your hands.  Get Help Right Away If:  You develop worsening fever or throat pain.  You develop a severe head ache or visual changes.  Your symptoms persist after you have completed your treatment plan.  Make sure you  Understand these instructions.  Will watch your condition.  Will get help right away if you are not doing well or get worse.  Your e-visit answers were reviewed by a board certified advanced clinical practitioner to complete your personal care plan.  Depending on the condition, your plan could have included both over the counter or prescription medications.  If there is a problem please reply  once you have received a response from your provider.  Your safety is important to [place].  If you have drug allergies check your prescription carefully.    You can use MyChart to  ask questions about todays visit, request a non-urgent call back, or ask for a work or school excuse for 24 hours related to this e-Visit. If it has been greater than 24 hours you will need to follow up with your provider, or enter a new e-Visit to address those concerns.  You will get an e-mail in the next two days asking about your experience.  I hope that your e-visit has been valuable and will speed your recovery. Thank you for using e-visits.

## 2018-09-28 NOTE — Telephone Encounter (Signed)
As per patient has runny nose, sore throat, exhaustion, little SOB bur denies fever and chills. 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 Patient advised to do E-visit.

## 2018-09-28 NOTE — Telephone Encounter (Signed)
Reviewed clinical triage. Patient has already proceeded to E-Visit for her symptoms. Agree. She is not high risk criteria without fever, travel history, or known exposure.  Follow-up as indicated.  Nobie Putnam, [facility] 09/28/2018, 10:56 AM

## 2018-09-29 ENCOUNTER — Telehealth (INDEPENDENT_AMBULATORY_CARE_PROVIDER_SITE_OTHER): Payer: BC Managed Care – PPO

## 2018-09-29 DIAGNOSIS — J209 Acute bronchitis, unspecified: Secondary | ICD-10-CM | POA: Diagnosis not present

## 2018-09-29 MED ORDER — AZITHROMYCIN 250 MG PO TABS: ORAL_TABLET | ORAL | 0 refills | Status: DC

## 2018-09-29 NOTE — Telephone Encounter (Signed)
TELEPHONIC MEDICAL VISIT  CC: Cough, Sore Throat  S: Reviewed CMA telephone note.  Reports over past 2-3 weeks feeling more fatigued and tired than usual. Last week Wednesday 3/11 started not feeling well, and symptoms started Thursday 3/12 developed sore throat and stomach upset, then gradual developed cough - mostly dry, non productive, also admitted runny nose sinus congestion  - Tried Mucinex, and benadryl - She did E Visit yesterday 09/28/18 through [facility] - dx with viral pharyngitis, given rx Tessalon perls and Flonase - but did not start yet.  No high risk travel or exposure suspected or confirmed for COVID19. 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 She has remained at home now.  Admitted brief chilled but never documented fever Admitted initial dizziness initially - since improved Admitted intermittent body ache Denies any fevers, sweats, sinus pain or pressure, headache, abdominal pain, diarrhea  O: No physical exam performed / due to telephone remote contact only   A: Suspected Acute Bronchitis, possible for benign viral etiology w/ pharyngitis may have onset initially now seemed to worsen and some change in symptoms. - Reassuring Afebrile currently and without dyspnea - No comorbid pulmonary conditions - LOW RISK for COVID19 based on no known travel/exposure - she is asking about testing for COVID but explained that she does not meet high risk criteria for testing thus far - may update in near future pending guidelines  P:  1. Start empiric Azithromycin Z pak (antibiotic) 2 tabs day 1, then 1 tab x 4 days, complete entire course even if improved 2. Start Tessalon perls, Flonase, may continue OTC regimen  Self quarantine - advised to avoid all exposure with others while during treatment. Should continue to quarantine for up to 7-14 days, pending resolution of symptoms, if symptoms resolve by 7 days and is afebrile >48 hours - may  STOP self quarantine at that time.   If symptoms do not resolve or improve - and still fever / cough - or worsening dyspnea - then should contact us and seek advice on if indicated for COVID19 testing OR where to seek care next - possibly at [facility] ED for further intervention and testing if indicated.   Disclosed to patient at start of encounter that we will bill telephone services and she will receive bill of services provided.  Patient consents to be treated via phone prior to discussion. - Patient is at home and is accessed via telephone. - Services are provided from [facility]. - Time spent in direct consultation with patient on phone: 10 minutes of medical discussion  Nobie Putnam, [facility] 09/29/2018, 12:29 PM

## 2018-09-29 NOTE — Telephone Encounter (Signed)
Patient had done E-visit yesterday was diagnosed with viral pharyngitis and was Rx Flonase and Tessalon pearls. She was not satisfied with evisit yesterday. 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 She has not checked her temperature but felt feverish which is on and off. Her cough is getting worst, get exhausted and feels weak, she does have SOB to some extend but not wheezing and having deeper cough. I have notified  patient that she will be receiving phone call from the provider. She was also interested in testing for coronavirus. Please suggest ?

## 2018-10-01 DIAGNOSIS — R05 Cough: Secondary | ICD-10-CM

## 2018-10-01 DIAGNOSIS — R059 Cough, unspecified: Secondary | ICD-10-CM

## 2018-10-02 MED ORDER — BENZONATATE 100 MG PO CAPS: 100.0000 mg | ORAL_CAPSULE | Freq: Three times a day (TID) | ORAL | 0 refills | Status: DC | PRN

## 2018-10-03 ENCOUNTER — Telehealth: Payer: Self-pay | Admitting: *Deleted

## 2018-10-03 ENCOUNTER — Telehealth: Payer: Self-pay | Admitting: Family Medicine

## 2018-10-03 ENCOUNTER — Telehealth: Payer: BC Managed Care – PPO | Admitting: Family

## 2018-10-03 DIAGNOSIS — R0602 Shortness of breath: Secondary | ICD-10-CM

## 2018-10-03 MED ORDER — TERBINAFINE HCL 250 MG PO TABS: 250.0000 mg | ORAL_TABLET | Freq: Every day | ORAL | 0 refills | Status: DC

## 2018-10-03 NOTE — Telephone Encounter (Signed)
-----   Message from Garrel Ridgel, [place] sent at 10/03/2018  2:50 PM EDT ----- Blood work looks norma and should continue medication.

## 2018-10-03 NOTE — Telephone Encounter (Signed)
Patient has recently been in contact w/ office in past few days with E Visit, MyChart and telephone discussion on her symptoms.  She did another E-Visit today and due to her shortness of breath concern she was advised to seek care Face to Face at an Urgent Care.  She called [place] to clarify. I advised her that I am not as concerned for COVID19 at this point, since her respiratory symptoms have improved, she is afebrile, but she does still endorse shortness of breath, she has concerns of walking pneumonia among other concerns, and still worried by her report about a possible drug allergy to azithromycin.  I advised her that based on her symptoms, and our current criteria in the ambulatory setting - I would recommend keeping advice of E Visit - she should go to Urgent Care if needed, otherwise can wait approximately 24 hours again to monitor her symptoms, if still only mild shortness of breath and afebrile can wait to see what the course of her symptoms are. She understands. Return / call back criteria reviewed.  Susan Strickland, [facility] 10/03/2018, 3:33 PM

## 2018-10-03 NOTE — Progress Notes (Signed)
Based on what you shared with me, I feel your condition warrants further evaluation and I recommend that you be seen for a face to face office visit.     NOTE: If you entered your credit card information for this eVisit, you will not be charged. You may see a "hold" on your card for the $35 but that hold will drop off and you will not have a charge processed.  If you are having a true medical emergency please call 911.  If you need an urgent face to face visit, [place] has four urgent care centers for your convenience.    PLEASE NOTE: THE [facility] LOCATIONS AND URGENT CARE CLINICS DO NOT HAVE THE TESTING FOR CORONAVIRUS COVID19 AVAILABLE.  IF YOU FEEL YOU NEED THIS TEST YOU MUST HAVE AN ORDER TO GO TO A TESTING LOCATION FROM YOUR PROVIDER OR FROM A SCREENING E-VISIT     [email or web address] to reserve your spot online an avoid wait times  [facility] [address], [address] [place], [place] [address] 8 am to 8 pm Monday-Friday 10 am to 4 pm Saturday-Sunday *Across the street from [employer]  [facility]  [address] [place], [address] 8 am to 5 pm Monday-Friday * In the [facility] on the [facility]   The following sites will take your insurance:  . [facility]  [phone] Get Driving Directions Find a Provider at this Location  [address] [place], [place] [address] . 10 am to 8 pm Monday-Friday . 12 pm to 8 pm Saturday-Sunday   . [facility]  [phone] Get Driving Directions Find a Provider at this Location  [address], [address] [place], [place] [address] . 8 am to 8 pm Monday-Friday . 9 am to 6 pm Saturday . 11 am to 6 pm Sunday   . [facility]  [phone] Get Driving Directions  [address] [place], [place] [address] . 8 am to 8 pm Monday-Friday . 8 am to 4 pm Saturday-Sunday   Your  e-visit answers were reviewed by a board certified advanced clinical practitioner to complete your personal care plan.  Thank you for using e-Visits. 

## 2018-10-03 NOTE — Telephone Encounter (Signed)
Left message informing pt of Dr. Hyatt's review of results and orders. 

## 2018-10-12 ENCOUNTER — Ambulatory Visit
Admission: RE | Admit: 2018-10-12 | Discharge: 2018-10-12 | Disposition: A | Discharge status: Home / Self Care | Payer: BC Managed Care – PPO | Attending: Family Medicine | Admitting: Family Medicine

## 2018-10-12 ENCOUNTER — Other Ambulatory Visit: Payer: Self-pay

## 2018-10-12 ENCOUNTER — Encounter: Payer: Self-pay | Admitting: Family Medicine

## 2018-10-12 ENCOUNTER — Ambulatory Visit
Admission: RE | Admit: 2018-10-12 | Discharge: 2018-10-12 | Disposition: A | Discharge status: Home / Self Care | Payer: BC Managed Care – PPO | Source: Ambulatory Visit | Attending: Family Medicine | Admitting: Family Medicine

## 2018-10-12 ENCOUNTER — Ambulatory Visit (INDEPENDENT_AMBULATORY_CARE_PROVIDER_SITE_OTHER): Payer: BC Managed Care – PPO | Admitting: Family Medicine

## 2018-10-12 DIAGNOSIS — R05 Cough: Secondary | ICD-10-CM | POA: Diagnosis not present

## 2018-10-12 DIAGNOSIS — R5381 Other malaise: Secondary | ICD-10-CM

## 2018-10-12 DIAGNOSIS — R0602 Shortness of breath: Secondary | ICD-10-CM

## 2018-10-12 DIAGNOSIS — R5383 Other fatigue: Secondary | ICD-10-CM

## 2018-10-12 DIAGNOSIS — R059 Cough, unspecified: Secondary | ICD-10-CM

## 2018-10-12 NOTE — Progress Notes (Signed)
Virtual Visit via Telephone The purpose of this virtual visit is to provide medical care while limiting exposure to the novel coronavirus (COVID19) for both patient and office staff.  Consent was obtained for phone visit:  Yes.   Answered questions that patient had about telehealth interaction:  Yes.   I discussed the limitations, risks, security and privacy concerns of performing an evaluation and management service by telephone. I also discussed with the patient that there may be a patient responsible charge related to this service. The patient expressed understanding and agreed to proceed.  Patient Location: Home Provider Location: [facility])   ----------------------------------------------------------------------  Chief Complaint  Patient presents with  . Cough    pt states the symptoms started 2-3 weeks cough, sore throat, w/ white patches, abdominal pain, diarrhea, and vomiting. . These symptoms have resolved. Now she complains of headaches,fatigue, bodyaches (back & chest), SOB , that worsen w/ exertion and abdominal discomfort that have persisted for 3 weeks.    S: Reviewed CMA telephone note below. I have called patient and gathered additional HPI as follows:  Recent course onset back on 3/12 with URI symptoms low grade fever and cough and sore throat, she pursued care with E-visit initially on 3/19 dx with URI treated with tessalon and flonase, did not improve, also had GI symptoms of diarrhea, vomiting and abdominal pain - then followed up with [place] and on 09/29/18 she was treated empirically for bronchitis vs walking pneumonia with azithromycin but had to stop due to side effect vs allergy after 3 days, then not improved again she did follow-up E-Visit on 3/24 and was advised to seek face to face care at that time, she declined to do this and reached out to our office, we gave her protocol guidelines for monitoring current symptoms if manageable at home with  continued supportive care, but provided very strict criteria when to seek care at hospital or urgent care  Now today calls back, for virtual telephone visit follow-up, describes some of her symptoms have improved, GI symptoms mostly resolved, no further abdominal discomfort, diarrhea, vomiting, but she still has persistent cough, shortness of breath, and some exertional dyspnea and fatigue. She still offers resistance to seeking care at urgent care, however she was scheduled to see Leodis Binet today this afternoon until she got our appointment today. - She is staying hydrated - Continues on supportive cough medicines  Patient currently remains in home isolation Denies any high risk travel to areas of current concern for COVID19. Denies any known or suspected exposure to person with or possibly with COVID19.  Admits shortness of breath  Denies any fevers, chills, sweats, body ache, sinus pain or pressure, headache, abdominal pain, diarrhea  -------------------------------------------------------------------------- O: No physical exam performed due to remote telephone encounter.  I have personally reviewed the radiology report from 10/12/18.  CLINICAL DATA:  Cough and fatigue for several days  EXAM: CHEST - 2 VIEW  COMPARISON:  12/14/2013  FINDINGS: The heart size and mediastinal contours are within normal limits. Both lungs are clear. The visualized skeletal structures are unremarkable.  IMPRESSION: No active cardiopulmonary disease.   Electronically Signed   By: Inez Catalina M.D.   On: 10/12/2018 10:21  -------------------------------------------------------------------------- A&P:   Suspected Community Acquired Pneumonia vs cannot rule out COVID19 or other viral syndrome, may have had influenza previously and is experiencing post-influenza syndrome as well. - Concern with duration of symptoms now 2-3 weeks, seems initially more viral constellation, now with respiratory  concerns concern possible underlying pneumonia - Still without fever, which is reassuring but difficulty in triaging some of her symptoms in past  - No comorbid pulmonary conditions (asthma, COPD) or immunocompromise  - Currently patient is MODERATE RISK for COVID19 based on current symptoms (has not had any fever) and no known travel/exposure - however at this time, now [place] is currently within phase of community spread, and therefore patient can still be potentially exposed. Cannot rule out case with her symptoms. Testing is not recommended option unless triaged at higher level of care and indicated.  1. Initial recommendations are to advise patient to seek care at Urgent Care today, however she prefers X-ray testing to rule out Pneumonia. Given her lack of fever and no documented exposure - she is still not at high risk for COVID19 - but again cannot rule out - I agree that X-ray is important next step - if we can identify possible underlying CAP or atypical pneumonia - would offer antibiotic course for this - however if X-ray is negative, then she needs to proceed with strict isolation / home quarantine protocol until symptoms subside 2. Ordered STAT Chest X-ray for [facility] - she will use strict precautions as advised for this imaging - once review result will contact her by phone today for further treatment plan 3. Also provided information for her to consider going to different Urgent Care locations or Hospital ED if worsening - to consider further respiratory illness triage as they recommend  UPDATE - today 10/12/18 approx 1130 - reviewed X-ray result, negative, see results above. Negative for pneumonia. Called patient to discuss results, she understands, and I advised her would consider it COVID19 until proven otherwise, she should proceed with continued isolation quarantine, continue symptomatic relief medication, and notify us if any significant changes - reviewed next steps and  emergency care if needed.  No orders of the defined types were placed in this encounter.   REQUIRED self quarantine to [place] - advised to avoid all exposure with others while during treatment. Should continue to quarantine for up to 7-14 days, pending resolution of symptoms, if symptoms resolve by 7 days and is afebrile >3 days - may STOP self quarantine at that time.  If symptoms do not resolve or significantly improve OR if WORSENING - fever / cough - or worsening shortness of breath - then should contact us and seek advice on next steps in treatment at home vs where/when to seek care at Urgent Care or Hospital ED for further intervention and possible testing if indicated.  Patient verbalizes understanding with the above medical recommendations including the limitation of remote medical advice.  Specific follow-up / call-back criteria were given for patient to follow-up or seek medical care more urgently if needed.   - Time spent in direct consultation with patient on phone: 13 minutes  Nobie Putnam, [facility] 10/12/2018, 8:10 AM

## 2018-10-12 NOTE — Patient Instructions (Addendum)
STAT X-ray Chest ordered at [facility] - go there now stay tuned for result  If pneumonia will treat accordingly, otherwise if negative may recommend continued supportive care and isolation at home - otherwise next option is [facility] - [place] [facility] - or Emergency Dept - these locations can do other testing and treatment  REQUIRED self quarantine to [place] - advised to avoid all exposure with others while during treatment. Should continue to quarantine for up to 7-14 days, pending resolution of symptoms, if symptoms resolve by 7 days and is afebrile >3 days - may STOP self quarantine at that time.   If you have any other questions or concerns, please feel free to call the office or send a message through [place]. You may also schedule an earlier appointment if necessary.  Additionally, you may be receiving a survey about your experience at our office within a few days to 1 week by e-mail or mail. We value your feedback.  Nobie Putnam, DO [place]

## 2018-11-01 ENCOUNTER — Ambulatory Visit: Payer: BC Managed Care – PPO | Admitting: Podiatry

## 2019-02-19 ENCOUNTER — Other Ambulatory Visit: Payer: Self-pay

## 2019-02-19 ENCOUNTER — Encounter: Payer: Self-pay | Admitting: Obstetrics and Gynecology

## 2019-02-19 ENCOUNTER — Encounter: Payer: Self-pay | Admitting: Family Medicine

## 2019-02-19 ENCOUNTER — Ambulatory Visit (INDEPENDENT_AMBULATORY_CARE_PROVIDER_SITE_OTHER): Payer: BC Managed Care – PPO | Admitting: Family Medicine

## 2019-02-19 ENCOUNTER — Ambulatory Visit (INDEPENDENT_AMBULATORY_CARE_PROVIDER_SITE_OTHER): Payer: BC Managed Care – PPO | Admitting: Obstetrics and Gynecology

## 2019-02-19 VITALS — BP 120/84 | Ht 60.0 in | Wt 221.4 lb

## 2019-02-19 DIAGNOSIS — R682 Dry mouth, unspecified: Secondary | ICD-10-CM | POA: Diagnosis not present

## 2019-02-19 DIAGNOSIS — Z0000 Encounter for general adult medical examination without abnormal findings: Secondary | ICD-10-CM

## 2019-02-19 DIAGNOSIS — Z131 Encounter for screening for diabetes mellitus: Secondary | ICD-10-CM

## 2019-02-19 DIAGNOSIS — N951 Menopausal and female climacteric states: Secondary | ICD-10-CM

## 2019-02-19 DIAGNOSIS — Z1239 Encounter for other screening for malignant neoplasm of breast: Secondary | ICD-10-CM

## 2019-02-19 DIAGNOSIS — Z13220 Encounter for screening for lipoid disorders: Secondary | ICD-10-CM

## 2019-02-19 DIAGNOSIS — Z6841 Body Mass Index (BMI) 40.0 and over, adult: Secondary | ICD-10-CM | POA: Insufficient documentation

## 2019-02-19 DIAGNOSIS — Z01419 Encounter for gynecological examination (general) (routine) without abnormal findings: Secondary | ICD-10-CM

## 2019-02-19 NOTE — Progress Notes (Signed)
Chief Complaint  Patient presents with  . Gynecologic Exam    pt believes she is going through menopause     HPI:      [name] is a 53 y.o. [phone] who LMP was Patient's last menstrual period was 02/07/2019 (approximate)., presents today for her annual examination.  Her menses are regular every 28-30 days, lasting 4 days, but flow is lighter and with more clots.  Dysmenorrhea mild, relieved with midol. She does not have intermenstrual bleeding. She does have vasomotor sx.   Sex activity: single partner, contraception - vasectomy.  Last Pap: 12/21/16  Results were: no abnormalities /neg HPV DNA  Hx of STDs: none  Last mammogram: 12/28/17 Results were: normal--routine follow-up in 12 months There is a FH of breast cancer in her mom, genetic testing not done. Pt doesn't meet current insurance guidelines re: cancer genetic testing. There is no FH of ovarian cancer. The patient does do self-breast exams.  Tobacco use: The patient denies current or previous tobacco use. Alcohol use: none  Drug use: none Exercise: moderately active  Colonoscopy: 2019 with Dr. Allen Norris; not sure when repeat due per pt.  She does not get adequate calcium and Vitamin D in her diet. No recent fasting labs.   Past Medical History:  Diagnosis Date  . Anxiety   . Breast cyst   . Degenerative disc disease, lumbar   . Depression   . History of mammogram 05/2012; 09/24/2014   wnl per pt; benign  . History of Papanicolaou smear of cervix 08/15/2012   -/-  . Hyperlipemia   . Vitamin D deficiency     Past Surgical History:  Procedure Laterality Date  . BACK SURGERY     L5  . BREAST BIOPSY Left 2016   NEG  . COLONOSCOPY WITH PROPOFOL N/A 04/27/2018   Procedure: COLONOSCOPY WITH BIOPSIES;  Surgeon: Lucilla Lame, MD;  Location: [facility];  Service: Endoscopy;  Laterality: N/A;  . ESOPHAGOGASTRODUODENOSCOPY (EGD) WITH PROPOFOL N/A 04/27/2018   Procedure: ESOPHAGOGASTRODUODENOSCOPY (EGD)  WITH BIOPSIES;  Surgeon: Lucilla Lame, MD;  Location: [facility];  Service: Endoscopy;  Laterality: N/A;  . 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   Marland Kitchen POLYPECTOMY N/A 04/27/2018   Procedure: POLYPECTOMY;  Surgeon: Lucilla Lame, MD;  Location: [facility];  Service: Endoscopy;  Laterality: N/A;    Family History  Problem Relation Age of Onset  . Breast cancer Mother 51  . Hypertension Father   . Kidney disease Father   . Diabetes Father        type 2  . Heart disease Father     Social History   Socioeconomic History  . Marital status: Married    Spouse name: Not on file  . Number of children: Not on file  . Years of education: 66  . Highest education level: Not on file  Occupational History  . Occupation: Pharmacist, hospital  Social Needs  . Financial resource strain: Not on file  . Food insecurity    Worry: Not on file    Inability: Not on file  . Transportation needs    Medical: Not on file    Non-medical: Not on file  Tobacco Use  . Smoking status: Never Smoker  . Smokeless tobacco: Never Used  Substance and Sexual Activity  . Alcohol use: No  . Drug use: No  . Sexual activity: Not Currently    Birth control/protection: Surgical, Other-see comments    Comment: vasectomy  Lifestyle  . Physical activity    Days per week: Not on file    Minutes per session: Not on file  . Stress: Not on file  Relationships  . Social Herbalist on phone: Not on file    Gets together: Not on file    Attends religious service: Not on file    Active member of club or organization: Not on file    Attends meetings of clubs or organizations: Not on file    Relationship status: Not on file  . Intimate partner violence    Fear of current or ex partner: Not on file    Emotionally abused: Not on file    Physically abused: Not on file    Forced sexual activity: Not on file  Other Topics Concern  . Not on file  Social History Narrative  . Not on file     Current  Outpatient Medications:  .  Ascorbic Acid (VITAMIN C) 1000 MG tablet, Take by mouth., Disp: , Rfl:  .  Cholecalciferol (VITAMIN D-1000 MAX ST) 1000 units tablet, Take by mouth., Disp: , Rfl:  .  fluticasone (FLONASE) 50 MCG/ACT nasal spray, Place 1 spray into both nostrils 2 (two) times daily., Disp: 16 g, Rfl: 6 .  meloxicam (MOBIC) 15 MG tablet, Take 1 tablet (15 mg total) by mouth daily., Disp: 30 tablet, Rfl: 3 .  Multiple Vitamin (MULTI-VITAMINS) TABS, Take by mouth., Disp: , Rfl:  .  sertraline (ZOLOFT) 100 MG tablet, Take 1 tablet (100 mg total) by mouth daily., Disp: 30 tablet, Rfl: 11  ROS: Review of Systems  Constitutional: Positive for malaise/fatigue. Negative for fever and weight loss.  HENT: Negative for congestion, ear pain and sinus pain.   Respiratory: Negative for cough, shortness of breath and wheezing.   Cardiovascular: Negative for chest pain, orthopnea and leg swelling.  Gastrointestinal: Negative for constipation, diarrhea, nausea and vomiting.  Genitourinary: Negative for dysuria, frequency, hematuria and urgency.       Breast ROS: negative   Musculoskeletal: Positive for myalgias. Negative for back pain and joint pain.  Skin: Negative for itching and rash.  Neurological: Positive for headaches. Negative for dizziness, tingling and focal weakness.  Endo/Heme/Allergies: Negative for environmental allergies. Does not bruise/bleed easily.  Psychiatric/Behavioral: Negative for depression and suicidal ideas. The patient is not nervous/anxious and does not have insomnia.      Objective: BP 120/84   Ht 5' (1.524 m)   Wt 221 lb 6.4 oz (100.4 kg)   LMP 02/07/2019 (Approximate)   BMI 43.24 kg/m    Physical Exam   Physical Exam Constitutional:      Appearance: She is well-developed.  Genitourinary:     Vulva, vagina, cervix, uterus, right adnexa and left adnexa normal.     No vulval lesion or tenderness noted.     No vaginal discharge, erythema or tenderness.      No cervical polyp.     Uterus is not enlarged or tender.     No right or left adnexal mass present.     Right adnexa not tender.     Left adnexa not tender.  Neck:     Musculoskeletal: Normal range of motion.     Thyroid: No thyromegaly.  Cardiovascular:     Rate and Rhythm: Normal rate and regular rhythm.     Heart sounds: Normal heart sounds. No murmur.  Pulmonary:     Effort: Pulmonary effort is normal.     Breath  sounds: Normal breath sounds.  Chest:     Breasts:        Right: No mass, nipple discharge, skin change or tenderness.        Left: No mass, nipple discharge, skin change or tenderness.  Abdominal:     Palpations: Abdomen is soft.     Tenderness: There is no abdominal tenderness. There is no guarding.  Musculoskeletal: Normal range of motion.  Neurological:     General: No focal deficit present.     Mental Status: She is alert and oriented to person, place, and time.     Cranial Nerves: No cranial nerve deficit.  Skin:    General: Skin is warm and dry.  Psychiatric:        Mood and Affect: Mood normal.        Behavior: Behavior normal.        Thought Content: Thought content normal.        Judgment: Judgment normal.  Vitals signs reviewed.     Assessment/Plan: Encounter for annual routine gynecological examination -   Screening for breast cancer - Plan: MM 3D SCREEN BREAST BILATERAL, Pt to sched mammo  Perimenopause - Plan: Reassurance re: sx. F/u prn DUB.  Family history of breast cancer--pt doesn't meet criteria for cancer genetic testing. Will cont to follow FH.  Blood tests for routine general physical examination -  Screening cholesterol level -   Screening for diabetes mellitus -   BMI 40.0-44.9, adult ([place]) -              GYN counsel breast self exam, mammography screening, adequate intake of calcium and vitamin D     F/U  Return in about 1 year (around 02/19/2020).   B. , PA-C 02/19/2019 2:40 PM

## 2019-02-19 NOTE — Patient Instructions (Signed)
I value your feedback and entrusting us with your care. If you get a [facility] patient survey, I would appreciate you taking the time to let us know about your experience today. Thank you! 

## 2019-02-19 NOTE — Progress Notes (Signed)
Virtual Visit via Telephone The purpose of this virtual visit is to provide medical care while limiting exposure to the novel coronavirus (COVID19) for both patient and office staff.  Consent was obtained for phone visit:  Yes.   Answered questions that patient had about telehealth interaction:  Yes.   I discussed the limitations, risks, security and privacy concerns of performing an evaluation and management service by telephone. I also discussed with the patient that there may be a patient responsible charge related to this service. The patient expressed understanding and agreed to proceed.  Patient Location: Home Provider Location: [facility])   ---------------------------------------------------------------------- Chief Complaint  Patient presents with  . Sore Throat    onset 4 days, nasal congestion denies cough, fever or SOB  . manoposal symptoms    had LMP 2 weeks ago, mood swing, dryness, rash onset 2 weeks ago     S: Reviewed CMA documentation. I have called patient and gathered additional HPI as follows:  SORE THROAT Reports symptoms onset for past 4 days with a feeling "dry cotton mouth", she does have congestion, no longer using congestion medication or nose spray. Tried chloraseptic temporarily relief. - Tried Benadryl recently for leg rash itching and unsure if this caused side effect dry mouth - She does not take Fish Oil - She eats salines regularly - She did not have problem with sleeping and breathing through mouth but not sure - Asking if this is related to hormonal issues, see question below - Denies fever chills sweats cough, nausea vomiting diarrhea  Rash Lower Extremity Thought heat rash, within crease, irritation and discomfort. Used barrier cream with resolution.  Perimenopausal Symptoms Last menstrual cycle 2 weeks ago, she has had more sporadic symptoms of bleeding, she has had about 2-3 erratic cycles now, previously has done  well with more regular cycles now. Typically about 4 days on average. 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   Denies any high risk travel to areas of current concern for COVID19. Denies any known or suspected exposure to person with or possibly with COVID19.  Denies any fevers, chills, sweats, body ache, cough, shortness of breath, sinus pain or pressure, headache, abdominal pain, diarrhea  -------------------------------------------------------------------------- O: No physical exam performed due to remote telephone encounter.  -------------------------------------------------------------------------- A&P:   Problem List Items Addressed This Visit    None    Visit Diagnoses    Dry mouth    -  Primary Uncertain etiology, without obvious sore throat or sinus infection No other concern for infection at this time, mostly asymptomatic Thought secondary to oral benadryl for leg rash itching  Plan - Stop benadryl - Improve hydration - Try OTC fish oil for improve secretions - May use flonase for nasal congestion F/u criteria given    Perimenopausal        Some irregular menstrual cycle symptoms now Age 53  Plan - Recommend that she return to her [place] PA to discuss further if testing is warranted at this time, given age 14 it is reasonable to investigate further    If symptoms do not resolve or significantly improve OR if WORSENING - fever / cough - or worsening shortness of breath - then should contact us and seek advice on next steps in treatment at home vs where/when to seek care at Urgent Care or Hospital ED for further intervention and possible testing if indicated.  Patient verbalizes understanding with the above medical recommendations including the limitation  of remote medical advice.  Specific follow-up / call-back criteria were given for patient to follow-up or seek medical care more urgently if needed.   - Time spent in direct consultation  with patient on phone: 9 minutes   Nobie Putnam, [facility] 02/19/2019, 10:39 AM

## 2019-02-19 NOTE — Patient Instructions (Addendum)
-   Stop benadryl - Improve hydration - Try OTC fish oil for improve secretions - May use flonase for nasal congestion  Follow up with Ardeth Perfect PA at [facility] for further perimenopausal evaluation  Remember we don't clinically diagnose you with menopause until no period for 12 months  Please schedule a Follow-up Appointment to: Return if symptoms worsen or fail to improve.  If you have any other questions or concerns, please feel free to call the office or send a message through [place]. You may also schedule an earlier appointment if necessary.  Additionally, you may be receiving a survey about your experience at our office within a few days to 1 week by e-mail or mail. We value your feedback.  Nobie Putnam, DO [place]

## 2019-03-30 ENCOUNTER — Ambulatory Visit (INDEPENDENT_AMBULATORY_CARE_PROVIDER_SITE_OTHER): Payer: BC Managed Care – PPO | Admitting: Nurse Practitioner

## 2019-03-30 ENCOUNTER — Other Ambulatory Visit: Payer: Self-pay

## 2019-03-30 ENCOUNTER — Encounter: Payer: Self-pay | Admitting: Nurse Practitioner

## 2019-03-30 VITALS — BP 123/73 | HR 84 | Ht 60.0 in | Wt 218.4 lb

## 2019-03-30 DIAGNOSIS — B370 Candidal stomatitis: Secondary | ICD-10-CM

## 2019-03-30 DIAGNOSIS — F3281 Premenstrual dysphoric disorder: Secondary | ICD-10-CM

## 2019-03-30 MED ORDER — MAGIC MOUTHWASH: ORAL | 0 refills | Status: DC

## 2019-03-30 MED ORDER — SERTRALINE HCL 100 MG PO TABS: 100.0000 mg | ORAL_TABLET | Freq: Every day | ORAL | 0 refills | Status: DC

## 2019-03-30 NOTE — Progress Notes (Signed)
Subjective:    Patient ID: Susan Strickland, female    DOB: 06/20/1966, 53 y.o.   MRN: [id number]  Susan Strickland is a 53 y.o. female presenting on 03/30/2019 for Mouth Lesions (severe dryness that caused her to develop cracks on the corner of the lips x 1 mths)   HPI  Thrush Cotton mouth, nothing makes it go away.  Feels very dry and worse some days than others.  Some times of the day is worse than others. Patient notes "everything tastes spicy" - patient also has lesions externally with two spots in corners of lips recurrently crack.  Neosporin helps some.   - Onset one month ago for both mouth symptoms and cracking lips. - Uses all biotene products with short term relief, Baking soda vinegar rinse hels some, again only temproary. - No visible coating on tongue, but feels like there is a coating on her tongue.  Not currently using flonase.    Zoloft refill - Stable on meds.  No concerns with anxiety and depression. - Sleep disruption due to menopause.   Social History   Tobacco Use  . Smoking status: Never Smoker  . Smokeless tobacco: Never Used  Substance Use Topics  . Alcohol use: No  . Drug use: No    Review of Systems Per HPI unless specifically indicated above     Objective:    BP 123/73 (BP Location: Right Arm, Patient Position: Sitting, Cuff Size: Large)   Pulse 84   Ht 5' (1.524 m)   Wt 218 lb 6.4 oz (99.1 kg)   BMI 42.65 kg/m   Wt Readings from Last 3 Encounters:  03/30/19 218 lb 6.4 oz (99.1 kg)  02/19/19 221 lb 6.4 oz (100.4 kg)  08/09/18 212 lb (96.2 kg)    Physical Exam Vitals signs reviewed.  Constitutional:      General: She is not in acute distress.    Appearance: She is well-developed.  HENT:     Head: Normocephalic and atraumatic.     Mouth/Throat:     Comments: 1) White coating on tongue in back of mouth noted. 2) Erythematous rash, white flakes at corners of mouth.  Cracking of skin noted.  Not consistent with herpetic lesions.  Cardiovascular:     Rate and Rhythm: Normal rate and regular rhythm.     Pulses: Normal pulses.     Heart sounds: Normal heart sounds.  Pulmonary:     Effort: Pulmonary effort is normal.     Breath sounds: Normal breath sounds.  Skin:    General: Skin is warm and dry.  Neurological:     Mental Status: She is alert and oriented to person, place, and time.  Psychiatric:        Behavior: Behavior normal.      Results for orders placed or performed in visit on 09/27/18  Hepatic Function Panel  Result Value Ref Range   Total Protein 6.8 6.0 - 8.5 g/dL   Albumin 4.2 3.8 - 4.9 g/dL   Bilirubin Total 0.3 0.0 - 1.2 mg/dL   Bilirubin, Direct 0.09 0.00 - 0.40 mg/dL   Alkaline Phosphatase 63 39 - 117 IU/L   AST 22 0 - 40 IU/L   ALT 16 0 - 32 IU/L      Assessment & Plan:   Problem List Items Addressed This Visit      Other   PMDD (premenstrual dysphoric disorder) Continue Zoloft. Refills sent.  Re-evaluate at next scheduled appointment with Dr. Parks Ranger.  Relevant Medications   sertraline (ZOLOFT) 100 MG tablet    Other Visit Diagnoses    Thrush    -  Primary Chronic infection, no with complications of cracking at lips.  No improvement using OTC mouth rinses, exam consistent with thrush.  Cannot exclude esophageal thrush, but is unlikely given lack of symptoms.  Plan: 1. START magic mouthwash.  Rinse, swish, spit x 2; swallow x 2 doses per day.  Use for 10 days. 2. START clotrimazole ointment/cream to corners of mouth bid x 10-14 days 3. Avoid Flonase for now - already off med. 4. Follow-up 1-2 weeks no improvement.   Relevant Medications   magic mouthwash SOLN      Meds ordered this encounter  Medications  . sertraline (ZOLOFT) 100 MG tablet    Sig: Take 1 tablet (100 mg total) by mouth daily.    Dispense:  90 tablet    Refill:  0    Order Specific Question:   Supervising Provider    Answer:   Olin Hauser [[id number]]  . magic mouthwash SOLN    Sig: 1 Part  Lidocaine, 1 Part diphenhydramine HCL, 1 Part Maalox, 1 Part Nystatin  Swish, gargle, and swallow.  Take 4 times daily after meals and before bed for thrush.    Dispense:  280 mL    Refill:  0    Order Specific Question:   Supervising Provider    Answer:   Olin Hauser [[id number]]    Follow up plan: Return 2 weeks if symptoms worsen or fail to improve.  Cassell Smiles, DNP, AGPCNP-BC Adult Gerontology Primary Care Nurse Practitioner [facility] 03/30/2019, 8:12 AM

## 2019-03-30 NOTE — Patient Instructions (Addendum)
Susan Strickland,   Thank you for coming in to clinic today.  1. Use nystatin magic mouth wash swish, gargle, swallow before meals and at bedtime.  2. START clotrimazole (athlete's foot cream) topically to the corners of your mouth twice daily for 10 days or longer if needed.  Please schedule a follow-up appointment with Cassell Smiles, AGNP. Return 2 weeks if symptoms worsen or fail to improve.  If you have any other questions or concerns, please feel free to call the clinic or send a message through [facility]. You may also schedule an earlier appointment if necessary.  You will receive a survey after today's visit either digitally by e-mail or paper by [employer]. Your experiences and feedback matter to [place].  Please respond so we know how we are doing as we provide care for you.  Cassell Smiles, DNP, AGNP-BC Adult Gerontology Nurse Practitioner [place]

## 2019-04-01 ENCOUNTER — Encounter: Payer: Self-pay | Admitting: Nurse Practitioner

## 2019-04-17 ENCOUNTER — Ambulatory Visit: Payer: BC Managed Care – PPO | Admitting: Family Medicine

## 2019-04-17 ENCOUNTER — Other Ambulatory Visit: Payer: Self-pay

## 2019-05-30 ENCOUNTER — Telehealth: Payer: Self-pay | Admitting: Family Medicine

## 2019-05-30 DIAGNOSIS — B370 Candidal stomatitis: Secondary | ICD-10-CM

## 2019-05-30 MED ORDER — MAGIC MOUTHWASH: ORAL | 0 refills | Status: DC

## 2019-05-30 NOTE — Telephone Encounter (Signed)
Printed re order to be faxed.  Susan Strickland, [facility] 05/30/2019, 1:30 PM

## 2019-05-30 NOTE — Telephone Encounter (Signed)
Pt. Called requesting refill on magic mouthwash

## 2019-06-01 ENCOUNTER — Telehealth: Payer: Self-pay

## 2019-06-01 NOTE — Telephone Encounter (Signed)
I attempted to contact the patient to f/u on refill request for Magic mouthwash. No answer, lmom.

## 2019-06-05 ENCOUNTER — Other Ambulatory Visit: Payer: Self-pay | Admitting: Nurse Practitioner

## 2019-06-05 DIAGNOSIS — F3281 Premenstrual dysphoric disorder: Secondary | ICD-10-CM

## 2019-06-12 ENCOUNTER — Ambulatory Visit: Payer: BC Managed Care – PPO | Admitting: Family Medicine

## 2019-06-14 ENCOUNTER — Emergency Department: Payer: BC Managed Care – PPO

## 2019-06-14 ENCOUNTER — Encounter: Payer: Self-pay | Admitting: Emergency Medicine

## 2019-06-14 ENCOUNTER — Emergency Department
Admission: EM | Admit: 2019-06-14 | Discharge: 2019-06-14 | Disposition: A | Discharge status: Home / Self Care | Payer: BC Managed Care – PPO | Attending: Emergency Medicine | Admitting: Emergency Medicine

## 2019-06-14 ENCOUNTER — Other Ambulatory Visit: Payer: Self-pay

## 2019-06-14 DIAGNOSIS — Z79899 Other long term (current) drug therapy: Secondary | ICD-10-CM | POA: Diagnosis not present

## 2019-06-14 DIAGNOSIS — I880 Nonspecific mesenteric lymphadenitis: Secondary | ICD-10-CM | POA: Insufficient documentation

## 2019-06-14 DIAGNOSIS — R109 Unspecified abdominal pain: Secondary | ICD-10-CM | POA: Diagnosis present

## 2019-06-14 LAB — COMPREHENSIVE METABOLIC PANEL
ALT: 21 U/L (ref 0–44)
AST: 28 U/L (ref 15–41)
Albumin: 4.1 g/dL (ref 3.5–5.0)
Alkaline Phosphatase: 59 U/L (ref 38–126)
Anion gap: 8 (ref 5–15)
BUN: 17 mg/dL (ref 6–20)
CO2: 25 mmol/L (ref 22–32)
Calcium: 8.9 mg/dL (ref 8.9–10.3)
Chloride: 104 mmol/L (ref 98–111)
Creatinine, Ser: 0.76 mg/dL (ref 0.44–1.00)
GFR calc Af Amer: >60 mL/min (ref >60)
GFR calc non Af Amer: >60 mL/min (ref >60)
Glucose, Bld: 85 mg/dL (ref 70–99)
Potassium: 4.6 mmol/L (ref 3.5–5.1)
Sodium: 137 mmol/L (ref 135–145)
Total Bilirubin: 0.6 mg/dL (ref 0.3–1.2)
Total Protein: 7.5 g/dL (ref 6.5–8.1)

## 2019-06-14 LAB — URINALYSIS, COMPLETE (UACMP) WITH MICROSCOPIC
Bacteria, UA: NONE SEEN
Bilirubin Urine: NEGATIVE
Glucose, UA: NEGATIVE mg/dL
Hgb urine dipstick: NEGATIVE
Ketones, ur: NEGATIVE mg/dL
Nitrite: NEGATIVE
Protein, ur: NEGATIVE mg/dL
Specific Gravity, Urine: 1.019 (ref 1.005–1.030)
pH: 5 (ref 5.0–8.0)

## 2019-06-14 LAB — CBC
HCT: 27.5 % — ABNORMAL LOW (ref 36.0–46.0)
Hemoglobin: 7.8 g/dL — ABNORMAL LOW (ref 12.0–15.0)
MCH: 17.8 pg — ABNORMAL LOW (ref 26.0–34.0)
MCHC: 28.4 g/dL — ABNORMAL LOW (ref 30.0–36.0)
MCV: 62.9 fL — ABNORMAL LOW (ref 80.0–100.0)
Platelets: 391 10*3/uL (ref 150–400)
RBC: 4.37 MIL/uL (ref 3.87–5.11)
RDW: 20.8 % — ABNORMAL HIGH (ref 11.5–15.5)
WBC: 8.2 10*3/uL (ref 4.0–10.5)
nRBC: 0 % (ref 0.0–0.2)

## 2019-06-14 LAB — LIPASE, BLOOD: Lipase: 38 U/L (ref 11–51)

## 2019-06-14 LAB — POCT PREGNANCY, URINE: Preg Test, Ur: NEGATIVE

## 2019-06-14 MED ORDER — IOHEXOL 300 MG/ML  SOLN
100.0000 mL | Freq: Once | INTRAMUSCULAR | Status: AC | PRN
  Administered 2019-06-14: 17:00:00 100 mL via INTRAVENOUS
  Filled 2019-06-14: qty 100

## 2019-06-14 MED ORDER — SODIUM CHLORIDE 0.9% FLUSH: 3.0000 mL | Freq: Once | INTRAVENOUS | Status: DC

## 2019-06-14 MED ORDER — TRAMADOL HCL 50 MG PO TABS: 50.0000 mg | ORAL_TABLET | Freq: Four times a day (QID) | ORAL | 0 refills | Status: AC | PRN

## 2019-06-14 NOTE — Discharge Instructions (Signed)
Please follow-up with your appointment with Dr. Lysle Pearl.  Please return the emergency department medially if your symptoms worsen.

## 2019-06-14 NOTE — Consult Note (Signed)
Subjective:   CC: Abdominal pain  HPI:  Susan Strickland is a 53 y.o. female who is consulted by  for evaluation of above cc.  Patient has a constellation of complaints with mainly abdominal pain abdominal pain specifically started several years ago, describes as very sharp and crampy, localized the periumbilical area, starting shortly after meals.  Happens every other day.  No nausea or vomiting.  Does endorse several episodes of bloody stools.  Endoscopy a year ago did not yield any results.  Gastroenterologist told him to proceed with symptomatic management at that time.  Patient states that she has increased back pain along with the abdominal pain.  She reported hematuria for 2 weeks per triage nurse but did not mention this to me.  She also states abdominal pain is immediately accompanied by headaches.  These episodes usually last for a few hours but then resolve on their own without any specific intervention.  However the patient and husband believes that the pain has increasingly gotten worse and more frequent therefore presented to the ED.   Past Medical History:  has a past medical history of Anxiety, Breast cyst, Degenerative disc disease, lumbar, Depression, History of mammogram (05/2012; 09/24/2014), History of Papanicolaou smear of cervix (08/15/2012), Hyperlipemia, and Vitamin D deficiency.  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  Family History: family history includes Breast cancer (age of onset: 66) in her mother; Diabetes in her father; Heart disease in her father; Hypertension in her father; Kidney disease in her father.  Social History:  reports that she has never smoked. She has never used smokeless tobacco. She reports that she does not drink  alcohol or use drugs.  Current Medications: (Not in a hospital admission)   Allergies:  Allergies as of 06/14/2019 - Review Complete 06/14/2019  Allergen Reaction Noted  . Lac bovis Nausea And Vomiting 10/21/2015  . Doxycycline Nausea Only 12/03/2016  . Milk-related compounds Nausea And Vomiting 10/21/2015  . Sweetness enhancer  04/24/2018  . Azithromycin Rash 10/12/2018    ROS:  General: Denies weight loss, weight gain, fatigue, fevers, chills, and night sweats. Eyes: Denies blurry vision, double vision, eye pain, itchy eyes, and tearing. Ears: Denies hearing loss, earache, and ringing in ears. Nose: Denies sinus pain, congestion, infections, runny nose, and nosebleeds. Mouth/throat: Denies hoarseness, sore throat, bleeding gums, and difficulty swallowing. Heart: Denies chest pain, palpitations, racing heart, irregular heartbeat, leg pain or swelling, and decreased activity tolerance. Respiratory: Denies breathing difficulty, shortness of breath, wheezing, cough, and sputum. GI: Denies change in appetite, heartburn, nausea, vomiting, constipation, diarrhea, and  GU: Denies difficulty urinating, pain with urinating, urgency, frequency, blood in urine. Musculoskeletal: Denies joint stiffness, pain, swelling, muscle weakness. Skin: Denies rash, itching, mass, tumors, sores, and boils Neurologic: Denies headache, fainting, dizziness, seizures, numbness, and tingling. Psychiatric: Denies depression, anxiety, difficulty sleeping, and memory loss. Endocrine: Denies heat or cold intolerance, and increased thirst or urination. Blood/lymph: Denies easy bruising, easy bruising, and swollen glands     Objective:     BP 136/79 (BP Location: Left Arm)   Pulse (!) 106   Temp 97.9 F (36.6 C) (Oral)   Resp 20   Ht 5' (1.524 m)   Wt 90.7 kg   SpO2 97%   BMI 39.06 kg/m    Constitutional :  alert, cooperative, appears stated  age and no distress  Lymphatics/Throat:  no asymmetry,  masses, or scars  Respiratory:  clear to auscultation bilaterally  Cardiovascular:  regular rate and rhythm  Gastrointestinal: Soft, no guarding, minimal tenderness in the periumbilical region..   Musculoskeletal: Steady movement  Skin: Cool and moist  Psychiatric: Normal affect, non-agitated, not confused       LABS:  CMP Latest Ref Rng & Units 06/14/2019 09/27/2018 02/24/2018  Glucose 70 - 99 mg/dL 85 - 105(H)  BUN 6 - 20 mg/dL 17 - 16  Creatinine 0.44 - 1.00 mg/dL 0.76 - 0.79  Sodium 135 - 145 mmol/L 137 - 139  Potassium 3.5 - 5.1 mmol/L 4.6 - 4.2  Chloride 98 - 111 mmol/L 104 - 105  CO2 22 - 32 mmol/L 25 - 26  Calcium 8.9 - 10.3 mg/dL 8.9 - 8.9  Total Protein 6.5 - 8.1 g/dL 7.5 6.8 6.7  Total Bilirubin 0.3 - 1.2 mg/dL 0.6 0.3 0.3  Alkaline Phos 38 - 126 U/L 59 63 -  AST 15 - 41 U/L 28 22 20   ALT 0 - 44 U/L 21 16 15    CBC Latest Ref Rng & Units 06/14/2019 04/24/2018 02/24/2018  WBC 4.0 - 10.5 K/uL 8.2 8.2 CANCELED  Hemoglobin 12.0 - 15.0 g/dL 7.8(L) 9.1(L) -  Hematocrit 36.0 - 46.0 % 27.5(L) 24.1(L) -  Platelets 150 - 400 K/uL 391 278 -     RADS: CLINICAL DATA:  53 year old female with mid abdominal pain.  EXAM: CT ABDOMEN AND PELVIS WITH CONTRAST  TECHNIQUE: Multidetector CT imaging of the abdomen and pelvis was performed using the standard protocol following bolus administration of intravenous contrast.  CONTRAST:  129mL OMNIPAQUE IOHEXOL 300 MG/ML  SOLN  COMPARISON:  CT of the abdomen pelvis dated 04/26/2014.  FINDINGS: Lower chest: The visualized lung bases are clear.  No intra-abdominal free air or free fluid.  Hepatobiliary: No focal liver abnormality is seen. No gallstones, gallbladder wall thickening, or biliary dilatation.  Pancreas: Unremarkable. No pancreatic ductal dilatation or surrounding inflammatory changes.  Spleen: Normal in size without focal abnormality.  Adrenals/Urinary Tract: The adrenal glands, kidneys, and the visualized  ureters and urinary bladder appear unremarkable. There is symmetric enhancement and excretion of contrast by both kidneys.  Stomach/Bowel: There is moderate stool throughout the colon. There is postsurgical changes of gastric sleeve. There is no bowel obstruction or active inflammation. The appendix is normal.  Vascular/Lymphatic: The abdominal aorta and IVC are unremarkable. The SMV, splenic vein, and main portal vein are patent. No portal venous gas. There is a focal nodular and curvilinear area of stranding in the lower mesentery (series 2 image 58 and coronal series 5, image 37). This may represent an inflamed mesenteric lymph nodes or a focal mesenteric twisting. There is no fluid collection.  Reproductive: The uterus and ovaries are grossly unremarkable.  Other: Small fat containing umbilical hernia.  Musculoskeletal: Degenerative changes of the lower lumbar spine with facet arthropathy. No acute osseous pathology.  IMPRESSION: 1. Focal nodular and curvilinear inflammation in the lower mesentery may represent an inflamed mesenteric lymph nodes or a focal mesenteric twisting. 2. No bowel obstruction or active inflammation. Normal appendix.   Electronically Signed   By: Anner Crete M.D.   On: 06/14/2019 16:50  Assessment:      Abdominal pain with abnormal CT scanning as noted above.  First time she has received a CT scan since these episodes have started.  Plan:     White count is normal, pain is  under control at this time, there is no signs of acute peritonitis.  Gust further diagnostic evaluation which at this point will be a diagnostic laparoscopy, since the CT findings are very atypical.  The pain location is consistent to where the abnormality was noted.  However, due to the constellation of symptoms, I explained that there is no guarantee that if we do find an abnormality within the abdomen, of her symptoms will resolve.  I also explained to her that  due to the nonspecific findings on the CT scan, there will be a chance that the diagnostic laparoscopy will yield nothing at all.  I did explain to them that since she is currently looking okay, with this symptoms going on for such a long time, that there is no immediate need for a diagnostic laparoscopy.  After extensive discussion with the patient and husband they opted for outpatient mid scheduling for a diagnostic laparoscopy.  Specifically declined a overnight observation and possible diagnostic laparoscopy the following day.  He said they will call our office to schedule at their earliest convenience.  I asked that they call our office while waiting in the meantime if the pain does recur in case she needs to be directly admitted.  They both verbalized understanding.  Follow-up in the office is not necessary since the discussion was completed during this visit.

## 2019-06-14 NOTE — ED Triage Notes (Signed)
C/O mid abdominal pain and hematuria x 2 weeks.  Also c/o urinary urgency.  Denies burning.  AAOx3.  Skin warm and dry. NAD

## 2019-06-14 NOTE — ED Provider Notes (Addendum)
[facility] Emergency Department Provider Note  ____________________________________________  Time seen: Approximately 3:19 PM  I have reviewed the triage vital signs and the nursing notes.   HISTORY  Chief Complaint Abdominal Pain and Hematuria    HPI Susan Strickland is a 53 y.o. female that presents to the emergency department for evaluation of mid abdominal pain for several weeks, worse for the last 3 days.  Patient has also had urinary urgency and frequency.  Food seems to make the pain worse but patient is still  hungry.  She has noticed a variation in the color of her urine but has not specifically noticed any blood.  Patient describes the pain as sharp.  Patient had an episode of low back pain 2 weeks ago.  Patient has had multiple kidney stones in the past and states that pain is not as bad as when she has had a kidney stone.  No dark or bloody stools.  No fever, cough, vomiting.   Past Medical History:  Diagnosis Date  . Anxiety   . Breast cyst   . Degenerative disc disease, lumbar   . Depression   . History of mammogram 05/2012; 09/24/2014   wnl per pt; benign  . History of Papanicolaou smear of cervix 08/15/2012   -/-  . Hyperlipemia   . Vitamin D deficiency     Patient Active Problem List   Diagnosis Date Noted  . BMI 40.0-44.9, adult ([place]) 02/19/2019  . Encounter for screening mammogram for breast cancer   . Intractable vomiting   . Acute gastritis without hemorrhage   . S/P bariatric surgery 04/25/2017  . Vitamin B12 nutritional deficiency 04/25/2017  . Obesity (BMI 30-39.9) 04/25/2017  . Normocytic anemia 04/25/2017  . Hyperlipidemia 04/25/2017  . Abnormal mammogram 02/01/2017  . PMDD (premenstrual dysphoric disorder) 10/21/2015  . Vitamin D deficiency 10/21/2015  . Degeneration of intervertebral disc of lumbar region 12/23/2014  . Calculus of kidney 04/29/2014  . L-S radiculopathy 11/04/2011    Past Surgical History:   Procedure Laterality Date  . BACK SURGERY     L5  . BREAST BIOPSY Left 2016   NEG  . COLONOSCOPY WITH PROPOFOL N/A 04/27/2018   Procedure: COLONOSCOPY WITH BIOPSIES;  Surgeon: Lucilla Lame, MD;  Location: [place];  Service: Endoscopy;  Laterality: N/A;  . ESOPHAGOGASTRODUODENOSCOPY (EGD) WITH PROPOFOL N/A 04/27/2018   Procedure: ESOPHAGOGASTRODUODENOSCOPY (EGD) WITH BIOPSIES;  Surgeon: Lucilla Lame, MD;  Location: [place];  Service: Endoscopy;  Laterality: N/A;  . 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   Marland Kitchen POLYPECTOMY N/A 04/27/2018   Procedure: POLYPECTOMY;  Surgeon: Lucilla Lame, MD;  Location: [place];  Service: Endoscopy;  Laterality: N/A;    Prior to Admission medications   Medication Sig Start Date End Date Taking? Authorizing Provider  Ascorbic Acid (VITAMIN C) 1000 MG tablet Take by mouth.    [provider]  Cholecalciferol (VITAMIN D-1000 MAX ST) 1000 units tablet Take by mouth.    [provider]  fluticasone (FLONASE) 50 MCG/ACT nasal spray Place 1 spray into both nostrils 2 (two) times daily. 09/28/18   Withrow, Elyse Jarvis, FNP  Multiple Vitamin (MULTI-VITAMINS) TABS Take by mouth.    [provider]  sertraline (ZOLOFT) 100 MG tablet TAKE 1 TABLET BY MOUTH EVERY DAY 06/05/19   Karamalegos, Devonne Doughty, DO  traMADol (ULTRAM) 50 MG tablet Take 1 tablet (50 mg total) by mouth every 6 (six) hours as needed for up to 2  days. 06/14/19 06/16/19  Laban Emperor, PA-C    Allergies Lac bovis, Doxycycline, Milk-related compounds, Sweetness enhancer, and Azithromycin  Family History  Problem Relation Age of Onset  . Breast cancer Mother 69  . Hypertension Father   . Kidney disease Father   . Diabetes Father        type 2  . Heart disease Father     Social History Social History   Tobacco Use  . Smoking status: Never Smoker  . Smokeless tobacco: Never Used  Substance Use Topics  . Alcohol use: No  . Drug use:  No     Review of Systems  Constitutional: No fever/chills ENT: No upper respiratory complaints. Cardiovascular: No chest pain. Respiratory: No cough. No SOB. Gastrointestinal: No abdominal pain.  No nausea, no vomiting.  Genitourinary: Negative for dysuria.  Positive for urgency and frequency. Musculoskeletal: Negative for musculoskeletal pain. Skin: Negative for rash, abrasions, lacerations, ecchymosis. Neurological: Negative for headaches, numbness or tingling   ____________________________________________   PHYSICAL EXAM:  VITAL SIGNS: ED Triage Vitals  Enc Vitals Group     BP 06/14/19 1318 136/79     Pulse Rate 06/14/19 1318 (!) 106     Resp 06/14/19 1318 20     Temp 06/14/19 1318 97.9 F (36.6 C)     Temp Source 06/14/19 1318 Oral     SpO2 06/14/19 1318 97 %     Weight 06/14/19 1253 218 lb 7.6 oz (99.1 kg)     Height 06/14/19 1318 5' (1.524 m)     Head Circumference --      Peak Flow --      Pain Score 06/14/19 1253 6     Pain Loc --      Pain Edu? --      Excl. in [place]? --      Constitutional: Alert and oriented. Well appearing and in no acute distress. Eyes: Conjunctivae are normal. PERRL. EOMI. Head: Atraumatic. ENT:      Ears:      Nose: No congestion/rhinnorhea.      Mouth/Throat: Mucous membranes are moist.  Neck: No stridor.   Cardiovascular: Normal rate, regular rhythm.  Good peripheral circulation. Respiratory: Normal respiratory effort without tachypnea or retractions. Lungs CTAB. Good air entry to the bases with no decreased or absent breath sounds. Gastrointestinal: Bowel sounds 4 quadrants.  Mid central abdominal tenderness to palpation.  No guarding or rigidity. No palpable masses. No distention.  Musculoskeletal: Full range of motion to all extremities. No gross deformities appreciated. Neurologic:  Normal speech and language. No gross focal neurologic deficits are appreciated.  Skin:  Skin is warm, dry and intact. No rash noted. Psychiatric:  Mood and affect are normal. Speech and behavior are normal. Patient exhibits appropriate insight and judgement.   ____________________________________________   LABS (all labs ordered are listed, but only abnormal results are displayed)  Labs Reviewed  CBC - Abnormal; Notable for the following components:      Result Value   Hemoglobin 7.8 (*)    HCT 27.5 (*)    MCV 62.9 (*)    MCH 17.8 (*)    MCHC 28.4 (*)    RDW 20.8 (*)    All other components within normal limits  URINALYSIS, COMPLETE (UACMP) WITH MICROSCOPIC - Abnormal; Notable for the following components:   Color, Urine YELLOW (*)    APPearance CLOUDY (*)    Leukocytes,Ua SMALL (*)    All other components within normal limits  LIPASE, BLOOD  COMPREHENSIVE METABOLIC PANEL  POC URINE PREG, ED  POCT PREGNANCY, URINE   ____________________________________________  EKG   ____________________________________________  RADIOLOGY   Ct Abdomen Pelvis W Contrast  Result Date: 06/14/2019 CLINICAL DATA:  53 year old female with mid abdominal pain. EXAM: CT ABDOMEN AND PELVIS WITH CONTRAST TECHNIQUE: Multidetector CT imaging of the abdomen and pelvis was performed using the standard protocol following bolus administration of intravenous contrast. CONTRAST:  170mL OMNIPAQUE IOHEXOL 300 MG/ML  SOLN COMPARISON:  CT of the abdomen pelvis dated 04/26/2014. FINDINGS: Lower chest: The visualized lung bases are clear. No intra-abdominal free air or free fluid. Hepatobiliary: No focal liver abnormality is seen. No gallstones, gallbladder wall thickening, or biliary dilatation. Pancreas: Unremarkable. No pancreatic ductal dilatation or surrounding inflammatory changes. Spleen: Normal in size without focal abnormality. Adrenals/Urinary Tract: The adrenal glands, kidneys, and the visualized ureters and urinary bladder appear unremarkable. There is symmetric enhancement and excretion of contrast by both kidneys. Stomach/Bowel: There is moderate  stool throughout the colon. There is postsurgical changes of gastric sleeve. There is no bowel obstruction or active inflammation. The appendix is normal. Vascular/Lymphatic: The abdominal aorta and IVC are unremarkable. The SMV, splenic vein, and main portal vein are patent. No portal venous gas. There is a focal nodular and curvilinear area of stranding in the lower mesentery (series 2 image 58 and coronal series 5, image 37). This may represent an inflamed mesenteric lymph nodes or a focal mesenteric twisting. There is no fluid collection. Reproductive: The uterus and ovaries are grossly unremarkable. Other: Small fat containing umbilical hernia. Musculoskeletal: Degenerative changes of the lower lumbar spine with facet arthropathy. No acute osseous pathology. IMPRESSION: 1. Focal nodular and curvilinear inflammation in the lower mesentery may represent an inflamed mesenteric lymph nodes or a focal mesenteric twisting. 2. No bowel obstruction or active inflammation. Normal appendix. Electronically Signed   By: Anner Crete M.D.   On: 06/14/2019 16:50    ____________________________________________    PROCEDURES  Procedure(s) performed:    Procedures    Medications  sodium chloride flush (NS) 0.9 % injection 3 mL (3 mLs Intravenous Not Given 06/14/19 1614)  iohexol (OMNIPAQUE) 300 MG/ML solution 100 mL (100 mLs Intravenous Contrast Given 06/14/19 1632)     ____________________________________________   INITIAL IMPRESSION / ASSESSMENT AND PLAN / ED COURSE  Pertinent labs & imaging results that were available during my care of the patient were reviewed by me and considered in my medical decision making (see chart for details).  Review of the [place] CSRS was performed in accordance of the [place] prior to dispensing any controlled drugs.    Patient presents to emergency department for evaluation of acute on chronic abdominal pain.  Vital signs and exam are reassuring.  Patient is nontoxic  and overall appears well.  CT scan consistent with inflamed mesenteric lymph nodes or focal mesenteric twisting.  Dr. Lysle Pearl with general surgery was consulted and came down to evaluate the patient.  Through their discussion, they decided that patient will follow up with him likely for an outpatient elective surgery.  Patient is agreeable with this plan.  Patient will be discharged home with prescriptions for tramadol. Patient is to follow up with general surgery as directed. Patient is given ED precautions to return to the ED for any worsening or new symptoms.  Susan Strickland was evaluated in Emergency Department on 06/14/2019 for the symptoms described in the history of present illness. She was evaluated in the context of the global COVID-19 pandemic, which  necessitated consideration that the patient might be at risk for infection with the SARS-CoV-2 virus that causes COVID-19. Institutional protocols and algorithms that pertain to the evaluation of patients at risk for COVID-19 are in a state of rapid change based on information released by regulatory bodies including the CDC and federal and state organizations. These policies and algorithms were followed during the patient's care in the ED.   ____________________________________________  FINAL CLINICAL IMPRESSION(S) / ED DIAGNOSES  Final diagnoses:  Mesenteric lymphadenitis      NEW MEDICATIONS STARTED DURING THIS VISIT:  ED Discharge Orders         Ordered    traMADol (ULTRAM) 50 MG tablet  Every 6 hours PRN     06/14/19 1944              This chart was dictated using voice recognition software/Dragon. Despite best efforts to proofread, errors can occur which can change the meaning. Any change was purely unintentional.    Laban Emperor, PA-C 06/14/19 2113    Laban Emperor, PA-C 06/14/19 2114    Delman Kitten, MD 06/14/19 2115

## 2019-06-18 ENCOUNTER — Ambulatory Visit: Payer: Self-pay | Admitting: Surgery

## 2019-06-18 NOTE — H&P (Signed)
Subjective:   CC: Abdominal pain  HPI:  Susan Strickland is a 53 y.o. female who is consulted by  for evaluation of above cc.  Patient has a constellation of complaints with mainly abdominal pain abdominal pain specifically started several years ago, describes as very sharp and crampy, localized the periumbilical area, starting shortly after meals.  Happens every other day.  No nausea or vomiting.  Does endorse several episodes of bloody stools.  Endoscopy a year ago did not yield any results.  Gastroenterologist told him to proceed with symptomatic management at that time.  Patient states that she has increased back pain along with the abdominal pain.  She reported hematuria for 2 weeks per triage nurse but did not mention this to me.  She also states abdominal pain is immediately accompanied by headaches.  These episodes usually last for a few hours but then resolve on their own without any specific intervention.  However the patient and husband believes that the pain has increasingly gotten worse and more frequent therefore presented to the ED.   Past Medical History:  has a past medical history of Anxiety, Breast cyst, Degenerative disc disease, lumbar, Depression, History of mammogram (05/2012; 09/24/2014), History of Papanicolaou smear of cervix (08/15/2012), Hyperlipemia, and Vitamin D deficiency.  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  Family History: family history includes Breast cancer (age of onset: 32) in her mother; Diabetes in her father; Heart disease in her father; Hypertension in her father; Kidney disease in her father.  Social History:  reports that she has never smoked. She has never used smokeless tobacco. She reports that she does  not drink alcohol or use drugs.  Current Medications: (Not in a hospital admission)   Allergies:       Allergies as of 06/14/2019 - Review Complete 06/14/2019  Allergen Reaction Noted  . Lac bovis Nausea And Vomiting 10/21/2015  . Doxycycline Nausea Only 12/03/2016  . Milk-related compounds Nausea And Vomiting 10/21/2015  . Sweetness enhancer  04/24/2018  . Azithromycin Rash 10/12/2018    ROS:  General: Denies weight loss, weight gain, fatigue, fevers, chills, and night sweats. Eyes: Denies blurry vision, double vision, eye pain, itchy eyes, and tearing. Ears: Denies hearing loss, earache, and ringing in ears. Nose: Denies sinus pain, congestion, infections, runny nose, and nosebleeds. Mouth/throat: Denies hoarseness, sore throat, bleeding gums, and difficulty swallowing. Heart: Denies chest pain, palpitations, racing heart, irregular heartbeat, leg pain or swelling, and decreased activity tolerance. Respiratory: Denies breathing difficulty, shortness of breath, wheezing, cough, and sputum. GI: Denies change in appetite, heartburn, nausea, vomiting, constipation, diarrhea, and  GU: Denies difficulty urinating, pain with urinating, urgency, frequency, blood in urine. Musculoskeletal: Denies joint stiffness, pain, swelling, muscle weakness. Skin: Denies rash, itching, mass, tumors, sores, and boils Neurologic: Denies headache, fainting, dizziness, seizures, numbness, and tingling. Psychiatric: Denies depression, anxiety, difficulty sleeping, and memory loss. Endocrine: Denies heat or cold intolerance, and increased thirst or urination. Blood/lymph: Denies easy bruising, easy bruising, and swollen glands     Objective:   BP 136/79 (BP Location: Left Arm)   Pulse (!) 106   Temp 97.9 F (36.6 C) (Oral)   Resp 20   Ht 5' (1.524 m)   Wt 90.7 kg   SpO2 97%   BMI 39.06 kg/m    Constitutional :  alert,  cooperative, appears stated age and no distress   Lymphatics/Throat:  no asymmetry, masses, or scars  Respiratory:  clear to auscultation bilaterally  Cardiovascular:  regular rate and rhythm  Gastrointestinal: Soft, no guarding, minimal tenderness in the periumbilical region..   Musculoskeletal: Steady movement  Skin: Cool and moist  Psychiatric: Normal affect, non-agitated, not confused       LABS:  CMP Latest Ref Rng & Units 06/14/2019 09/27/2018 02/24/2018  Glucose 70 - 99 mg/dL 85 - 105(H)  BUN 6 - 20 mg/dL 17 - 16  Creatinine 0.44 - 1.00 mg/dL 0.76 - 0.79  Sodium 135 - 145 mmol/L 137 - 139  Potassium 3.5 - 5.1 mmol/L 4.6 - 4.2  Chloride 98 - 111 mmol/L 104 - 105  CO2 22 - 32 mmol/L 25 - 26  Calcium 8.9 - 10.3 mg/dL 8.9 - 8.9  Total Protein 6.5 - 8.1 g/dL 7.5 6.8 6.7  Total Bilirubin 0.3 - 1.2 mg/dL 0.6 0.3 0.3  Alkaline Phos 38 - 126 U/L 59 63 -  AST 15 - 41 U/L 28 22 20   ALT 0 - 44 U/L 21 16 15    CBC Latest Ref Rng & Units 06/14/2019 04/24/2018 02/24/2018  WBC 4.0 - 10.5 K/uL 8.2 8.2 CANCELED  Hemoglobin 12.0 - 15.0 g/dL 7.8(L) 9.1(L) -  Hematocrit 36.0 - 46.0 % 27.5(L) 24.1(L) -  Platelets 150 - 400 K/uL 391 278 -     RADS: CLINICAL DATA: 53 year old female with mid abdominal pain.  EXAM: CT ABDOMEN AND PELVIS WITH CONTRAST  TECHNIQUE: Multidetector CT imaging of the abdomen and pelvis was performed using the standard protocol following bolus administration of intravenous contrast.  CONTRAST: 116mL OMNIPAQUE IOHEXOL 300 MG/ML SOLN  COMPARISON: CT of the abdomen pelvis dated 04/26/2014.  FINDINGS: Lower chest: The visualized lung bases are clear.  No intra-abdominal free air or free fluid.  Hepatobiliary: No focal liver abnormality is seen. No gallstones, gallbladder wall thickening, or biliary dilatation.  Pancreas: Unremarkable. No pancreatic ductal dilatation or surrounding inflammatory changes.  Spleen: Normal in size without focal abnormality.  Adrenals/Urinary Tract: The  adrenal glands, kidneys, and the visualized ureters and urinary bladder appear unremarkable. There is symmetric enhancement and excretion of contrast by both kidneys.  Stomach/Bowel: There is moderate stool throughout the colon. There is postsurgical changes of gastric sleeve. There is no bowel obstruction or active inflammation. The appendix is normal.  Vascular/Lymphatic: The abdominal aorta and IVC are unremarkable. The SMV, splenic vein, and main portal vein are patent. No portal venous gas. There is a focal nodular and curvilinear area of stranding in the lower mesentery (series 2 image 58 and coronal series 5, image 37). This may represent an inflamed mesenteric lymph nodes or a focal mesenteric twisting. There is no fluid collection.  Reproductive: The uterus and ovaries are grossly unremarkable.  Other: Small fat containing umbilical hernia.  Musculoskeletal: Degenerative changes of the lower lumbar spine with facet arthropathy. No acute osseous pathology.  IMPRESSION: 1. Focal nodular and curvilinear inflammation in the lower mesentery may represent an inflamed mesenteric lymph nodes or a focal mesenteric twisting. 2. No bowel obstruction or active inflammation. Normal appendix.   Electronically Signed By: Anner Crete M.D. On: 06/14/2019 16:50  Assessment:      Abdominal pain with abnormal CT scanning as noted above.  First time she has received a CT scan since these episodes have started.  Plan:   White count is normal, pain is under control at this time, there is no  signs of acute peritonitis.  Gust further diagnostic evaluation which at this point will be a diagnostic laparoscopy, since the CT findings are very atypical.  The pain location is consistent to where the abnormality was noted.  However, due to the constellation of symptoms, I explained that there is no guarantee that if we do find an abnormality within the abdomen, of her symptoms  will resolve.  I also explained to her that due to the nonspecific findings on the CT scan, there will be a chance that the diagnostic laparoscopy will yield nothing at all.  I did explain to them that since she is currently looking okay, with this symptoms going on for such a long time, that there is no immediate need for a diagnostic laparoscopy.  After extensive discussion with the patient and husband they opted for outpatient mid scheduling for a diagnostic laparoscopy.  Specifically declined a overnight observation and possible diagnostic laparoscopy the following day.  He said they will call our office to schedule at their earliest convenience.  I asked that they call our office while waiting in the meantime if the pain does recur in case she needs to be directly admitted.  They both verbalized understanding.  Follow-up in the office is not necessary since the discussion was completed during this visit.   The risk of surgery include, but not limited to, recurrence, bleeding, chronic pain, post-op infxn, post-op SBO or ileus, hernias, resection of bowel, re-anastamosis, and need for re-operation to address said risks. The risks of general anesthetic, if used, includes MI, CVA, sudden death or even reaction to anesthetic medications also discussed. Alternatives include continued observation  Benefits include possible symptom relief, preventing further decline in health and possible death.            Electronically signed by Benjamine Sprague, DO at 06/14/2019 7:51 PM

## 2019-06-19 ENCOUNTER — Ambulatory Visit (INDEPENDENT_AMBULATORY_CARE_PROVIDER_SITE_OTHER): Payer: BC Managed Care – PPO | Admitting: Gastroenterology

## 2019-06-19 ENCOUNTER — Encounter: Payer: Self-pay | Admitting: Gastroenterology

## 2019-06-19 ENCOUNTER — Other Ambulatory Visit: Payer: Self-pay

## 2019-06-19 VITALS — BP 123/67 | HR 97 | Temp 98.1°F | Ht 60.0 in | Wt 220.0 lb

## 2019-06-19 DIAGNOSIS — R112 Nausea with vomiting, unspecified: Secondary | ICD-10-CM

## 2019-06-19 DIAGNOSIS — R109 Unspecified abdominal pain: Secondary | ICD-10-CM | POA: Diagnosis not present

## 2019-06-19 MED ORDER — DICYCLOMINE HCL 20 MG PO TABS: 20.0000 mg | ORAL_TABLET | Freq: Three times a day (TID) | ORAL | 3 refills | Status: DC

## 2019-06-19 NOTE — Progress Notes (Signed)
Primary Care Physician: Olin Hauser, DO  Primary Gastroenterologist:  Dr. Lucilla Lame  Chief Complaint  Patient presents with   Abdominal Pain    HPI: Susan Strickland is a 53 y.o. female here with a history of abdominal pain.  The patient reports that she has abdominal pain and cramping.  She went to the emergency room and was worked up which included a CT scan that showed:  IMPRESSION: 1. Focal nodular and curvilinear inflammation in the lower mesentery may represent an inflamed mesenteric lymph nodes or a focal mesenteric twisting. 2. No bowel obstruction or active inflammation. Normal appendix.  She reports that she was seen by a surgeon who based on these findings suggested a exploratory laparoscopy.  The patient is concerned that she has Crohn's disease.  The patient had a colonoscopy by me that did not show any inflammation in the terminal ileum or any inflammation or signs of inflammatory bowel disease on the random colon biopsies.  The patient reports that her pain is intermittent.  The pain does not usually wake her up at night but does occur as soon as she wakes up in the morning.  She does endorse that she is not sleeping well regularly.  There is no report of any unexplained weight loss.  She has a history of a gastric sleeve surgery. The patient has been having some nausea and vomiting which she states happens with almost everything she eats.  Despite this the patient states she has not lost any weight.  The patient was found to have anemia with a baseline of 9.1 in October and 7.8 on December 3 of this year.  Patient's MCV was also noted below.  She denies any overt sign of bleeding.  Current Outpatient Medications  Medication Sig Dispense Refill   acetaminophen (TYLENOL) 325 MG tablet Take 650 mg by mouth every 6 (six) hours as needed for moderate pain.     Ascorbic Acid (VITAMIN C) 1000 MG tablet Take 1,000 mg by mouth daily.      b complex vitamins  tablet Take 1 tablet by mouth daily.     Cholecalciferol (VITAMIN D-1000 MAX ST) 1000 units tablet Take 1,000 Units by mouth daily.      ferrous sulfate 325 (65 FE) MG tablet Take 325 mg by mouth daily with breakfast.     sertraline (ZOLOFT) 100 MG tablet TAKE 1 TABLET BY MOUTH EVERY DAY (Patient taking differently: Take 100 mg by mouth daily. ) 90 tablet 1   vitamin B-12 (CYANOCOBALAMIN) 500 MCG tablet Take 500 mcg by mouth daily.     dicyclomine (BENTYL) 20 MG tablet Take 1 tablet (20 mg total) by mouth 3 (three) times daily before meals. 90 tablet 3   fluticasone (FLONASE) 50 MCG/ACT nasal spray Place 1 spray into both nostrils 2 (two) times daily. (Patient not taking: Reported on 06/19/2019) 16 g 6   No current facility-administered medications for this visit.     Allergies as of 06/19/2019 - Review Complete 06/19/2019  Allergen Reaction Noted   Lac bovis Nausea And Vomiting 10/21/2015   Doxycycline Nausea Only 12/03/2016   Milk-related compounds Nausea And Vomiting 10/21/2015   Sweetness enhancer  04/24/2018   Azithromycin Rash 10/12/2018    ROS:  General: Negative for anorexia, weight loss, fever, chills, fatigue, weakness. ENT: Negative for hoarseness, difficulty swallowing , nasal congestion. CV: Negative for chest pain, angina, palpitations, dyspnea on exertion, peripheral edema.  Respiratory: Negative for dyspnea at rest, dyspnea on  exertion, cough, sputum, wheezing.  GI: See history of present illness. GU:  Negative for dysuria, hematuria, urinary incontinence, urinary frequency, nocturnal urination.  Endo: Negative for unusual weight change.    Physical Examination:   BP 123/67    Pulse 97    Temp 98.1 F (36.7 C) (Oral)    Ht 5' (1.524 m)    Wt 220 lb (99.8 kg)    BMI 42.97 kg/m   General: Well-nourished, well-developed in no acute distress.  Eyes: No icterus. Conjunctivae pink. Lungs: Clear to auscultation bilaterally. Non-labored. Heart: Regular rate  and rhythm, no murmurs rubs or gallops.  Abdomen: Bowel sounds are normal, positive tenderness mostly on the right side, nondistended, no hepatosplenomegaly or masses, no abdominal bruits or hernia , no rebound or guarding.   Extremities: No lower extremity edema. No clubbing or deformities. Neuro: Alert and oriented x 3.  Grossly intact. Skin: Warm and dry, no jaundice.   Psych: Alert and cooperative, normal mood and affect.  Labs:    Imaging Studies: Ct Abdomen Pelvis W Contrast  Result Date: 06/14/2019 CLINICAL DATA:  53 year old female with mid abdominal pain. EXAM: CT ABDOMEN AND PELVIS WITH CONTRAST TECHNIQUE: Multidetector CT imaging of the abdomen and pelvis was performed using the standard protocol following bolus administration of intravenous contrast. CONTRAST:  175mL OMNIPAQUE IOHEXOL 300 MG/ML  SOLN COMPARISON:  CT of the abdomen pelvis dated 04/26/2014. FINDINGS: Lower chest: The visualized lung bases are clear. No intra-abdominal free air or free fluid. Hepatobiliary: No focal liver abnormality is seen. No gallstones, gallbladder wall thickening, or biliary dilatation. Pancreas: Unremarkable. No pancreatic ductal dilatation or surrounding inflammatory changes. Spleen: Normal in size without focal abnormality. Adrenals/Urinary Tract: The adrenal glands, kidneys, and the visualized ureters and urinary bladder appear unremarkable. There is symmetric enhancement and excretion of contrast by both kidneys. Stomach/Bowel: There is moderate stool throughout the colon. There is postsurgical changes of gastric sleeve. There is no bowel obstruction or active inflammation. The appendix is normal. Vascular/Lymphatic: The abdominal aorta and IVC are unremarkable. The SMV, splenic vein, and main portal vein are patent. No portal venous gas. There is a focal nodular and curvilinear area of stranding in the lower mesentery (series 2 image 58 and coronal series 5, image 37). This may represent an inflamed  mesenteric lymph nodes or a focal mesenteric twisting. There is no fluid collection. Reproductive: The uterus and ovaries are grossly unremarkable. Other: Small fat containing umbilical hernia. Musculoskeletal: Degenerative changes of the lower lumbar spine with facet arthropathy. No acute osseous pathology. IMPRESSION: 1. Focal nodular and curvilinear inflammation in the lower mesentery may represent an inflamed mesenteric lymph nodes or a focal mesenteric twisting. 2. No bowel obstruction or active inflammation. Normal appendix. Electronically Signed   By: Anner Crete M.D.   On: 06/14/2019 16:50    Assessment and Plan:   Susan Strickland is a 53 y.o. y/o female who comes in today with a question of whether she has inflammatory bowel disease or irritable bowel syndrome due to her recurrent abdominal pain.  The patient has been told that she likely has irritable bowel syndrome since the symptoms appear to wax and wane.  The patient also has nausea and vomiting without any weight loss.  The patient will be set up for an EGD and she will also be started on dicyclomine 20 mg 3 times a day.  If the symptoms do not improve she may need a repeat fecal calprotectin and/or CRP.  If the patient's  anemia persists she may need a capsule endoscopy.  The patient has also been told that I do not recommend her undergoing any exploratory surgery at this time until we have tried to treat her for her irritable bowel syndrome and ruled out any functional bowel disease.  The patient has been explained the plan and agrees with it.     Lucilla Lame, MD. Marval Regal    Note: This dictation was prepared with Dragon dictation along with smaller phrase technology. Any transcriptional errors that result from this process are unintentional.

## 2019-06-19 NOTE — H&P (View-Only) (Signed)
Primary Care Physician: Olin Hauser, DO  Primary Gastroenterologist:  Dr. Lucilla Lame  Chief Complaint  Patient presents with   Abdominal Pain    HPI: Susan Strickland is a 53 y.o. female here with a history of abdominal pain.  The patient reports that she has abdominal pain and cramping.  She went to the emergency room and was worked up which included a CT scan that showed:  IMPRESSION: 1. Focal nodular and curvilinear inflammation in the lower mesentery may represent an inflamed mesenteric lymph nodes or a focal mesenteric twisting. 2. No bowel obstruction or active inflammation. Normal appendix.  She reports that she was seen by a surgeon who based on these findings suggested a exploratory laparoscopy.  The patient is concerned that she has Crohn's disease.  The patient had a colonoscopy by me that did not show any inflammation in the terminal ileum or any inflammation or signs of inflammatory bowel disease on the random colon biopsies.  The patient reports that her pain is intermittent.  The pain does not usually wake her up at night but does occur as soon as she wakes up in the morning.  She does endorse that she is not sleeping well regularly.  There is no report of any unexplained weight loss.  She has a history of a gastric sleeve surgery. The patient has been having some nausea and vomiting which she states happens with almost everything she eats.  Despite this the patient states she has not lost any weight.  The patient was found to have anemia with a baseline of 9.1 in October and 7.8 on December 3 of this year.  Patient's MCV was also noted below.  She denies any overt sign of bleeding.  Current Outpatient Medications  Medication Sig Dispense Refill   acetaminophen (TYLENOL) 325 MG tablet Take 650 mg by mouth every 6 (six) hours as needed for moderate pain.     Ascorbic Acid (VITAMIN C) 1000 MG tablet Take 1,000 mg by mouth daily.      b complex vitamins  tablet Take 1 tablet by mouth daily.     Cholecalciferol (VITAMIN D-1000 MAX ST) 1000 units tablet Take 1,000 Units by mouth daily.      ferrous sulfate 325 (65 FE) MG tablet Take 325 mg by mouth daily with breakfast.     sertraline (ZOLOFT) 100 MG tablet TAKE 1 TABLET BY MOUTH EVERY DAY (Patient taking differently: Take 100 mg by mouth daily. ) 90 tablet 1   vitamin B-12 (CYANOCOBALAMIN) 500 MCG tablet Take 500 mcg by mouth daily.     dicyclomine (BENTYL) 20 MG tablet Take 1 tablet (20 mg total) by mouth 3 (three) times daily before meals. 90 tablet 3   fluticasone (FLONASE) 50 MCG/ACT nasal spray Place 1 spray into both nostrils 2 (two) times daily. (Patient not taking: Reported on 06/19/2019) 16 g 6   No current facility-administered medications for this visit.     Allergies as of 06/19/2019 - Review Complete 06/19/2019  Allergen Reaction Noted   Lac bovis Nausea And Vomiting 10/21/2015   Doxycycline Nausea Only 12/03/2016   Milk-related compounds Nausea And Vomiting 10/21/2015   Sweetness enhancer  04/24/2018   Azithromycin Rash 10/12/2018    ROS:  General: Negative for anorexia, weight loss, fever, chills, fatigue, weakness. ENT: Negative for hoarseness, difficulty swallowing , nasal congestion. CV: Negative for chest pain, angina, palpitations, dyspnea on exertion, peripheral edema.  Respiratory: Negative for dyspnea at rest, dyspnea on  exertion, cough, sputum, wheezing.  GI: See history of present illness. GU:  Negative for dysuria, hematuria, urinary incontinence, urinary frequency, nocturnal urination.  Endo: Negative for unusual weight change.    Physical Examination:   BP 123/67    Pulse 97    Temp 98.1 F (36.7 C) (Oral)    Ht 5' (1.524 m)    Wt 220 lb (99.8 kg)    BMI 42.97 kg/m   General: Well-nourished, well-developed in no acute distress.  Eyes: No icterus. Conjunctivae pink. Lungs: Clear to auscultation bilaterally. Non-labored. Heart: Regular rate  and rhythm, no murmurs rubs or gallops.  Abdomen: Bowel sounds are normal, positive tenderness mostly on the right side, nondistended, no hepatosplenomegaly or masses, no abdominal bruits or hernia , no rebound or guarding.   Extremities: No lower extremity edema. No clubbing or deformities. Neuro: Alert and oriented x 3.  Grossly intact. Skin: Warm and dry, no jaundice.   Psych: Alert and cooperative, normal mood and affect.  Labs:    Imaging Studies: Ct Abdomen Pelvis W Contrast  Result Date: 06/14/2019 CLINICAL DATA:  53 year old female with mid abdominal pain. EXAM: CT ABDOMEN AND PELVIS WITH CONTRAST TECHNIQUE: Multidetector CT imaging of the abdomen and pelvis was performed using the standard protocol following bolus administration of intravenous contrast. CONTRAST:  171mL OMNIPAQUE IOHEXOL 300 MG/ML  SOLN COMPARISON:  CT of the abdomen pelvis dated 04/26/2014. FINDINGS: Lower chest: The visualized lung bases are clear. No intra-abdominal free air or free fluid. Hepatobiliary: No focal liver abnormality is seen. No gallstones, gallbladder wall thickening, or biliary dilatation. Pancreas: Unremarkable. No pancreatic ductal dilatation or surrounding inflammatory changes. Spleen: Normal in size without focal abnormality. Adrenals/Urinary Tract: The adrenal glands, kidneys, and the visualized ureters and urinary bladder appear unremarkable. There is symmetric enhancement and excretion of contrast by both kidneys. Stomach/Bowel: There is moderate stool throughout the colon. There is postsurgical changes of gastric sleeve. There is no bowel obstruction or active inflammation. The appendix is normal. Vascular/Lymphatic: The abdominal aorta and IVC are unremarkable. The SMV, splenic vein, and main portal vein are patent. No portal venous gas. There is a focal nodular and curvilinear area of stranding in the lower mesentery (series 2 image 58 and coronal series 5, image 37). This may represent an inflamed  mesenteric lymph nodes or a focal mesenteric twisting. There is no fluid collection. Reproductive: The uterus and ovaries are grossly unremarkable. Other: Small fat containing umbilical hernia. Musculoskeletal: Degenerative changes of the lower lumbar spine with facet arthropathy. No acute osseous pathology. IMPRESSION: 1. Focal nodular and curvilinear inflammation in the lower mesentery may represent an inflamed mesenteric lymph nodes or a focal mesenteric twisting. 2. No bowel obstruction or active inflammation. Normal appendix. Electronically Signed   By: Anner Crete M.D.   On: 06/14/2019 16:50    Assessment and Plan:   Susan Strickland is a 53 y.o. y/o female who comes in today with a question of whether she has inflammatory bowel disease or irritable bowel syndrome due to her recurrent abdominal pain.  The patient has been told that she likely has irritable bowel syndrome since the symptoms appear to wax and wane.  The patient also has nausea and vomiting without any weight loss.  The patient will be set up for an EGD and she will also be started on dicyclomine 20 mg 3 times a day.  If the symptoms do not improve she may need a repeat fecal calprotectin and/or CRP.  If the patient's  anemia persists she may need a capsule endoscopy.  The patient has also been told that I do not recommend her undergoing any exploratory surgery at this time until we have tried to treat her for her irritable bowel syndrome and ruled out any functional bowel disease.  The patient has been explained the plan and agrees with it.     Lucilla Lame, MD. [facility] Regal    Note: This dictation was prepared with Dragon dictation along with smaller phrase technology. Any transcriptional errors that result from this process are unintentional.

## 2019-06-22 ENCOUNTER — Inpatient Hospital Stay: Admission: RE | Admit: 2019-06-22 | Payer: BC Managed Care – PPO | Source: Ambulatory Visit

## 2019-06-25 ENCOUNTER — Telehealth: Payer: Self-pay

## 2019-06-25 NOTE — Telephone Encounter (Signed)
The pt called complaining of knots under her skin where she had a IV placed x 11 days ago for a CT scan with contrast.  She describe it as blood clots under the skin. She states it painful to the touch, but admits if she don't touch it she doesn't have any discomfort. She want to know if this is normal. Please advise

## 2019-06-25 NOTE — Telephone Encounter (Signed)
The pt was notified and she verbalize understanding. She said if it worsen or if she develops fever, redness or discomfort she will give [place] a call back.

## 2019-06-25 NOTE — Telephone Encounter (Signed)
This is tricky to answer without evaluating the skin on exam in person or with detailed pictures.  You can develop "superficial thrombophlebitis" after iv venupuncture can cause superficial clot or damage to veins or can rarely get superficial infection or cellulitis - this would be more red pain warmth spreading possible fever or other symptoms.  If still bothering her, she can be seen at Urgent Care in person or send [place] pictures and we can look at it and then schedule her for Virtual Visit if needed.  Nobie Putnam, [facility] 06/25/2019, 11:42 AM

## 2019-06-26 ENCOUNTER — Other Ambulatory Visit
Admission: RE | Admit: 2019-06-26 | Discharge: 2019-06-26 | Disposition: A | Discharge status: Home / Self Care | Payer: BC Managed Care – PPO | Source: Ambulatory Visit | Attending: Gastroenterology | Admitting: Gastroenterology

## 2019-06-26 ENCOUNTER — Other Ambulatory Visit: Payer: BC Managed Care – PPO

## 2019-06-26 ENCOUNTER — Other Ambulatory Visit: Payer: Self-pay

## 2019-06-26 DIAGNOSIS — Z20828 Contact with and (suspected) exposure to other viral communicable diseases: Secondary | ICD-10-CM | POA: Diagnosis not present

## 2019-06-26 DIAGNOSIS — Z01812 Encounter for preprocedural laboratory examination: Secondary | ICD-10-CM | POA: Diagnosis not present

## 2019-06-27 LAB — SARS CORONAVIRUS 2 (TAT 6-24 HRS): SARS Coronavirus 2: NEGATIVE

## 2019-06-29 ENCOUNTER — Other Ambulatory Visit: Payer: Self-pay

## 2019-06-29 ENCOUNTER — Encounter: Payer: Self-pay | Admitting: Gastroenterology

## 2019-06-29 ENCOUNTER — Ambulatory Visit
Admission: RE | Admit: 2019-06-29 | Discharge: 2019-06-29 | Disposition: A | Discharge status: Home / Self Care | Payer: BC Managed Care – PPO | Attending: Gastroenterology | Admitting: Gastroenterology

## 2019-06-29 ENCOUNTER — Ambulatory Visit: Admit: 2019-06-29 | Payer: BC Managed Care – PPO | Admitting: Surgery

## 2019-06-29 ENCOUNTER — Ambulatory Visit: Payer: BC Managed Care – PPO | Admitting: Registered Nurse

## 2019-06-29 ENCOUNTER — Encounter
Admission: RE | Disposition: A | Discharge status: Home / Self Care | Payer: Self-pay | Source: Home / Self Care | Attending: Gastroenterology

## 2019-06-29 DIAGNOSIS — F329 Major depressive disorder, single episode, unspecified: Secondary | ICD-10-CM | POA: Diagnosis not present

## 2019-06-29 DIAGNOSIS — K259 Gastric ulcer, unspecified as acute or chronic, without hemorrhage or perforation: Secondary | ICD-10-CM | POA: Diagnosis not present

## 2019-06-29 DIAGNOSIS — F419 Anxiety disorder, unspecified: Secondary | ICD-10-CM | POA: Diagnosis not present

## 2019-06-29 DIAGNOSIS — Z9884 Bariatric surgery status: Secondary | ICD-10-CM | POA: Insufficient documentation

## 2019-06-29 DIAGNOSIS — R109 Unspecified abdominal pain: Secondary | ICD-10-CM | POA: Diagnosis not present

## 2019-06-29 DIAGNOSIS — K449 Diaphragmatic hernia without obstruction or gangrene: Secondary | ICD-10-CM | POA: Insufficient documentation

## 2019-06-29 DIAGNOSIS — Z79899 Other long term (current) drug therapy: Secondary | ICD-10-CM | POA: Insufficient documentation

## 2019-06-29 DIAGNOSIS — R1013 Epigastric pain: Secondary | ICD-10-CM | POA: Insufficient documentation

## 2019-06-29 DIAGNOSIS — Z6837 Body mass index (BMI) 37.0-37.9, adult: Secondary | ICD-10-CM | POA: Insufficient documentation

## 2019-06-29 DIAGNOSIS — E559 Vitamin D deficiency, unspecified: Secondary | ICD-10-CM | POA: Insufficient documentation

## 2019-06-29 HISTORY — PX: ESOPHAGOGASTRODUODENOSCOPY (EGD) WITH PROPOFOL: SHX5813

## 2019-06-29 LAB — POCT PREGNANCY, URINE: Preg Test, Ur: NEGATIVE

## 2019-06-29 SURGERY — ESOPHAGOGASTRODUODENOSCOPY (EGD) WITH PROPOFOL
Anesthesia: General

## 2019-06-29 SURGERY — LAPAROSCOPY, DIAGNOSTIC, ROBOT-ASSISTED
Anesthesia: General

## 2019-06-29 MED ORDER — PROPOFOL 500 MG/50ML IV EMUL
INTRAVENOUS | Status: DC | PRN
  Administered 2019-06-29: 140 ug/kg/min via INTRAVENOUS

## 2019-06-29 MED ORDER — PROPOFOL 10 MG/ML IV BOLUS
INTRAVENOUS | Status: DC | PRN
  Administered 2019-06-29: 70 mg via INTRAVENOUS

## 2019-06-29 MED ORDER — LIDOCAINE HCL (CARDIAC) PF 100 MG/5ML IV SOSY
PREFILLED_SYRINGE | INTRAVENOUS | Status: DC | PRN
  Administered 2019-06-29: 100 mg via INTRAVENOUS

## 2019-06-29 MED ORDER — GLYCOPYRROLATE 0.2 MG/ML IJ SOLN
INTRAMUSCULAR | Status: DC | PRN
  Administered 2019-06-29: .2 mg via INTRAVENOUS

## 2019-06-29 MED ORDER — SODIUM CHLORIDE 0.9 % IV SOLN: INTRAVENOUS | Status: DC

## 2019-06-29 NOTE — Anesthesia Preprocedure Evaluation (Signed)
Anesthesia Evaluation  Patient identified by MRN, date of birth, ID band Patient awake    Reviewed: Allergy & Precautions, H&P , NPO status , Patient's Chart, lab work & pertinent test results, reviewed documented beta blocker date and time   History of Anesthesia Complications Negative for: history of anesthetic complications  Airway Mallampati: III  TM Distance: >3 FB Neck ROM: full    Dental  (+) Dental Advidsory Given, Missing, Teeth Intact, Implants   Pulmonary neg pulmonary ROS,    Pulmonary exam normal        Cardiovascular Exercise Tolerance: Good negative cardio ROS Normal cardiovascular exam     Neuro/Psych PSYCHIATRIC DISORDERS Anxiety Depression negative neurological ROS     GI/Hepatic negative GI ROS, Neg liver ROS,   Endo/Other  neg diabetesMorbid obesity  Renal/GU negative Renal ROS  negative genitourinary   Musculoskeletal   Abdominal   Peds  Hematology negative hematology ROS (+)   Anesthesia Other Findings Past Medical History: No date: Anxiety No date: Breast cyst No date: Degenerative disc disease, lumbar No date: Depression 05/2012; 09/24/2014: History of mammogram     Comment:  wnl per pt; benign 08/15/2012: History of Papanicolaou smear of cervix     Comment:  -/- No date: Hyperlipemia No date: Vitamin D deficiency   Reproductive/Obstetrics negative OB ROS                             Anesthesia Physical Anesthesia Plan  ASA: III  Anesthesia Plan: General   Post-op Pain Management:    Induction: Intravenous  PONV Risk Score and Plan: 2 and Propofol infusion and TIVA  Airway Management Planned: Natural Airway and Nasal Cannula  Additional Equipment:   Intra-op Plan:   Post-operative Plan:   Informed Consent: I have reviewed the patients History and Physical, chart, labs and discussed the procedure including the risks, benefits and alternatives  for the proposed anesthesia with the patient or authorized representative who has indicated his/her understanding and acceptance.     Dental Advisory Given  Plan Discussed with: Anesthesiologist, CRNA and Surgeon  Anesthesia Plan Comments:         Anesthesia Quick Evaluation

## 2019-06-29 NOTE — Interval H&P Note (Signed)
History and Physical Interval Note:  06/29/2019 9:12 AM  Susan Strickland  has presented today for surgery, with the diagnosis of generalized abdominal pain R10.84.  The various methods of treatment have been discussed with the patient and family. After consideration of risks, benefits and other options for treatment, the patient has consented to  Procedure(s): ESOPHAGOGASTRODUODENOSCOPY (EGD) WITH PROPOFOL (N/A) as a surgical intervention.  [name] history has been reviewed, patient examined, no change in status, stable for surgery.  I have reviewed the patient's chart and labs.  Questions were answered to the patient's satisfaction.     [name]

## 2019-06-29 NOTE — Transfer of Care (Signed)
Immediate Anesthesia Transfer of Care Note  Patient: Coriann Starbird Baird  Procedure(s) Performed: ESOPHAGOGASTRODUODENOSCOPY (EGD) WITH PROPOFOL (N/A )  Patient Location: PACU  Anesthesia Type:General  Level of Consciousness: sedated  Airway & Oxygen Therapy: Patient Spontanous Breathing  Post-op Assessment: Report given to RN and Post -op Vital signs reviewed and stable  Post vital signs: Reviewed and stable  Last Vitals:  Vitals Value Taken Time  BP 98/74 06/29/19 0946  Temp    Pulse 89 06/29/19 0946  Resp 15 06/29/19 0946  SpO2 96 % 06/29/19 0946  Vitals shown include unvalidated device data.  Last Pain:  Vitals:   06/29/19 0946  TempSrc:   PainSc: 0-No pain         Complications: No apparent anesthesia complications

## 2019-06-29 NOTE — Op Note (Signed)
[facility] Gastroenterology Patient Name: Susan Strickland Procedure Date: 06/29/2019 9:30 AM MRN: [id number] Account #: [id number] Date of Birth: 1966-05-15 Admit Type: Outpatient Age: 53 Room: [facility] ENDO ROOM 4 Gender: Female Note Status: Finalized Procedure:             Upper GI endoscopy Indications:           Epigastric abdominal pain Providers:             Lucilla Lame MD, MD Referring MD:          Olin Hauser (Referring MD) Medicines:             Propofol per Anesthesia Complications:         No immediate complications. Procedure:             Pre-Anesthesia Assessment:                        - Prior to the procedure, a History and Physical was                         performed, and patient medications and allergies were                         reviewed. [name] tolerance of previous                         anesthesia was also reviewed. The risks and benefits                         of the procedure and the sedation options and risks                         were discussed with the patient. All questions were                         answered, and informed consent was obtained. Prior                         Anticoagulants: The patient has taken no previous                         anticoagulant or antiplatelet agents. ASA Grade                         Assessment: II - A patient with mild systemic disease.                         After reviewing the risks and benefits, the patient                         was deemed in satisfactory condition to undergo the                         procedure.                        After obtaining informed consent, the endoscope was  passed under direct vision. Throughout the procedure,                         the patient's blood pressure, pulse, and oxygen                         saturations were monitored continuously. The Endoscope                         was introduced through the mouth,  and advanced to the                         second part of duodenum. The upper GI endoscopy was                         accomplished without difficulty. The patient tolerated                         the procedure well. Findings:      A small hiatal hernia was present.      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      A single erosion with no stigmata of recent bleeding was found in the       gastric fundus.      The examined duodenum was normal. Impression:            - Small hiatal hernia.                        - A sleeve gastrectomy was found.                        - Gastric erosion with no stigmata of recent bleeding.                        - Normal examined duodenum.                        - No specimens collected. Recommendation:        - Discharge patient to home.                        - Resume previous diet.                        - Continue present medications.                        - Use a proton pump inhibitor PO daily. Procedure Code(s):     --- Professional ---                        [phone], Esophagogastroduodenoscopy, flexible,                         transoral; diagnostic, including collection of                         specimen(s) by brushing or washing, when performed                         (  separate procedure) Diagnosis Code(s):     --- Professional ---                        R10.13, Epigastric pain                        K25.9, Gastric ulcer, unspecified as acute or chronic,                         without hemorrhage or perforation CPT copyright 2019 [employer]. All rights reserved. The codes documented in this report are preliminary and upon coder review may  be revised to meet current compliance requirements. Lucilla Lame MD, MD 06/29/2019 9:44:40 AM This report has been signed electronically. Number of Addenda: 0 Note Initiated On: 06/29/2019 9:30 AM Estimated Blood Loss:  Estimated blood loss:  none.      [facility]

## 2019-06-29 NOTE — Anesthesia Postprocedure Evaluation (Signed)
Anesthesia Post Note  Patient: Susan Strickland  Procedure(s) Performed: ESOPHAGOGASTRODUODENOSCOPY (EGD) WITH PROPOFOL (N/A )  Patient location during evaluation: Endoscopy Anesthesia Type: General Level of consciousness: awake and alert Pain management: pain level controlled Vital Signs Assessment: post-procedure vital signs reviewed and stable Respiratory status: spontaneous breathing, nonlabored ventilation, respiratory function stable and patient connected to nasal cannula oxygen Cardiovascular status: blood pressure returned to baseline and stable Postop Assessment: no apparent nausea or vomiting Anesthetic complications: no     Last Vitals:  Vitals:   06/29/19 1000 06/29/19 1010  BP: 95/72 (!) 113/92  Pulse: 83 78  Resp: 18 18  Temp:    SpO2: 100% 100%    Last Pain:  Vitals:   06/29/19 0946  TempSrc: Temporal  PainSc: 0-No pain                 Martha Clan

## 2019-06-29 NOTE — Anesthesia Post-op Follow-up Note (Signed)
Anesthesia QCDR form completed.        

## 2019-07-02 ENCOUNTER — Encounter: Payer: Self-pay | Admitting: *Deleted

## 2019-07-25 ENCOUNTER — Ambulatory Visit: Payer: BC Managed Care – PPO | Admitting: Gastroenterology

## 2019-09-09 ENCOUNTER — Ambulatory Visit: Payer: BC Managed Care – PPO | Attending: Internal Medicine

## 2019-09-09 DIAGNOSIS — Z23 Encounter for immunization: Secondary | ICD-10-CM | POA: Insufficient documentation

## 2019-09-09 NOTE — Progress Notes (Signed)
   Covid-19 Vaccination Clinic  Name:  Susan Strickland    MRN: [id number] DOB: 1966/04/01  09/09/2019  [name] was observed post Covid-19 immunization for 15 minutes without incidence. She was provided with Vaccine Information Sheet and instruction to access the V-Safe system.   [name] was instructed to call 911 with any severe reactions post vaccine: Marland Kitchen Difficulty breathing  . Swelling of your face and throat  . A fast heartbeat  . A bad rash all over your body  . Dizziness and weakness    Immunizations Administered    Name Date Dose VIS Date Route   Pfizer COVID-19 Vaccine 09/09/2019  1:42 PM 0.3 mL 06/22/2019 Intramuscular   Manufacturer: [place]   Lot: [id number]   [place]: [id number]

## 2019-10-02 ENCOUNTER — Ambulatory Visit: Payer: BC Managed Care – PPO | Attending: Internal Medicine

## 2019-10-02 DIAGNOSIS — Z23 Encounter for immunization: Secondary | ICD-10-CM

## 2019-10-02 NOTE — Progress Notes (Signed)
   Covid-19 Vaccination Clinic  Name:  Susan Strickland    MRN: [id number] DOB: 1965-11-03  10/02/2019  Susan Strickland was observed post Covid-19 immunization for 15 minutes without incident. She was provided with Vaccine Information Sheet and instruction to access the V-Safe system.   Susan Strickland was instructed to call 911 with any severe reactions post vaccine: Marland Kitchen Difficulty breathing  . Swelling of face and throat  . A fast heartbeat  . A bad rash all over body  . Dizziness and weakness   Immunizations Administered    Name Date Dose VIS Date Route   Pfizer COVID-19 Vaccine 10/02/2019  2:08 PM 0.3 mL 06/22/2019 Intramuscular   Manufacturer: [place]   Lot: [id number]   [place]: [id number]

## 2019-10-31 ENCOUNTER — Other Ambulatory Visit: Payer: Self-pay | Admitting: Student

## 2019-10-31 DIAGNOSIS — M5440 Lumbago with sciatica, unspecified side: Secondary | ICD-10-CM

## 2019-10-31 DIAGNOSIS — G8929 Other chronic pain: Secondary | ICD-10-CM

## 2019-11-06 ENCOUNTER — Other Ambulatory Visit: Payer: Self-pay | Admitting: Family Medicine

## 2019-11-06 DIAGNOSIS — F3281 Premenstrual dysphoric disorder: Secondary | ICD-10-CM

## 2019-11-06 NOTE — Telephone Encounter (Signed)
No longer patient of [facility]. She goes to [facility] now.  Nobie Putnam, [facility] 11/06/2019, 5:44 PM

## 2019-11-06 NOTE — Telephone Encounter (Signed)
Requested medication (s) are due for refill today: Yes  Requested medication (s) are on the active medication list: Yes  Last refill:  06/05/19  Future visit scheduled: No  Notes to clinic:  Left message to call back and schedule an appointment.    Requested Prescriptions  Pending Prescriptions Disp Refills   sertraline (ZOLOFT) 100 MG tablet [Pharmacy Med Name: SERTRALINE HCL 100 MG TABLET] 90 tablet 1    Sig: TAKE 1 TABLET BY MOUTH EVERY DAY      Psychiatry:  Antidepressants - SSRI Failed - 11/06/2019  7:32 AM      Failed - Valid encounter within last 6 months    Recent Outpatient Visits           7 months ago [facility] Merrilyn P[place], Jerrel Ivory, NP   8 months ago Dry mouth   [place], Devonne Doughty, DO   1 year ago Shortness of breath   [place], DO   1 year ago Morton's neuroma of second interspace of left foot   [place], DO   1 year ago Gastroesophageal reflux disease, esophagitis presence not specified   [place], DO              Passed - Completed PHQ-2 or PHQ-9 in the last 360 days.

## 2019-11-11 ENCOUNTER — Ambulatory Visit
Admission: RE | Admit: 2019-11-11 | Discharge: 2019-11-11 | Disposition: A | Discharge status: Home / Self Care | Payer: BC Managed Care – PPO | Source: Ambulatory Visit | Attending: Student | Admitting: Student

## 2019-11-11 DIAGNOSIS — G8929 Other chronic pain: Secondary | ICD-10-CM | POA: Diagnosis present

## 2019-11-11 DIAGNOSIS — M5440 Lumbago with sciatica, unspecified side: Secondary | ICD-10-CM | POA: Insufficient documentation

## 2019-11-11 DIAGNOSIS — M545 Low back pain: Secondary | ICD-10-CM | POA: Diagnosis not present

## 2019-11-21 ENCOUNTER — Telehealth: Payer: Self-pay

## 2019-11-21 NOTE — Telephone Encounter (Signed)
It does often happen but isn't considered normal. She can give it another wk and if it stops and doesn't happen again, then that is ok. If it persists or happens again next month, pt needs further eval. Thx

## 2019-11-21 NOTE — Telephone Encounter (Signed)
Pt aware.

## 2019-11-21 NOTE — Telephone Encounter (Signed)
Pt reports she has been bleeding for 2 weeks. Inquiring if she needs to schedule apt or if she should wait another week. Uncertain if she is going thru menopause. [id number]

## 2019-11-28 DIAGNOSIS — M5116 Intervertebral disc disorders with radiculopathy, lumbar region: Secondary | ICD-10-CM | POA: Insufficient documentation

## 2019-11-28 DIAGNOSIS — G8929 Other chronic pain: Secondary | ICD-10-CM | POA: Insufficient documentation

## 2019-12-11 ENCOUNTER — Encounter: Payer: Self-pay | Admitting: Obstetrics & Gynecology

## 2019-12-11 ENCOUNTER — Other Ambulatory Visit: Payer: Self-pay | Admitting: Obstetrics & Gynecology

## 2019-12-11 ENCOUNTER — Ambulatory Visit (INDEPENDENT_AMBULATORY_CARE_PROVIDER_SITE_OTHER): Payer: BC Managed Care – PPO | Admitting: Obstetrics & Gynecology

## 2019-12-11 ENCOUNTER — Other Ambulatory Visit: Payer: Self-pay

## 2019-12-11 VITALS — BP 120/82 | Ht 60.0 in | Wt 223.0 lb

## 2019-12-11 DIAGNOSIS — N6310 Unspecified lump in the right breast, unspecified quadrant: Secondary | ICD-10-CM

## 2019-12-11 NOTE — Progress Notes (Signed)
HPI:      [name] is a 54 y.o. [phone] who is perimenopausal, presents today for a problem visit.  She complains of breast lump on the rightside which she first noticed four days ago.  It has not significantly changed.  Associated symptoms include none.  Denies nipple discharge or skin changes.  No fever.  Mass is NT.  Prior Mammogram: 2019, normal. Prior breast problems: Yes, prior cyst many years ago Family History: Breast Cancer-relatedfamily history includes Breast cancer (age of onset: 41) in her mother.   Pt also reports continued irreg cycles and bleeding.  OK today.  For 6 mos, she has had this worsening period bleeding.  No hormones used.  PMHx: She  has a past medical history of Anxiety, Breast cyst, Degenerative disc disease, lumbar, Depression, History of mammogram (05/2012; 09/24/2014), History of Papanicolaou smear of cervix (08/15/2012), Hyperlipemia, and Vitamin D deficiency. Also,  has a past surgical history that includes Gastric bypass (03/2014); Breast biopsy (Left, 2016); Back surgery; Colonoscopy with propofol (N/A, 04/27/2018); Esophagogastroduodenoscopy (egd) with propofol (N/A, 04/27/2018); polypectomy (N/A, 04/27/2018); and Esophagogastroduodenoscopy (egd) with propofol (N/A, 06/29/2019)., family history includes Breast cancer (age of onset: 36) in her mother; Diabetes in her father; Heart disease in her father; Hypertension in her father; Kidney disease in her father.,  reports that she has never smoked. She has never used smokeless tobacco. She reports that she does not drink alcohol or use drugs.  She has a current medication list which includes the following prescription(s): sertraline, acetaminophen, vitamin c, b complex vitamins, cholecalciferol, dicyclomine, ferrous sulfate, fluticasone, and vitamin b-12. Also, is allergic to lac bovis; doxycycline; milk-related compounds; sweetness enhancer; and azithromycin.  Review of Systems  Constitutional: Negative  for chills, fever and malaise/fatigue.  HENT: Negative for congestion, sinus pain and sore throat.   Eyes: Negative for blurred vision and pain.  Respiratory: Negative for cough and wheezing.   Cardiovascular: Negative for chest pain and leg swelling.  Gastrointestinal: Negative for abdominal pain, constipation, diarrhea, heartburn, nausea and vomiting.  Genitourinary: Negative for dysuria, frequency, hematuria and urgency.  Musculoskeletal: Negative for back pain, joint pain, myalgias and neck pain.  Skin: Negative for itching and rash.  Neurological: Negative for dizziness, tremors and weakness.  Endo/Heme/Allergies: Does not bruise/bleed easily.  Psychiatric/Behavioral: Negative for depression. The patient is not nervous/anxious and does not have insomnia.     Objective: BP 120/82   Ht 5' (1.524 m)   Wt 223 lb (101.2 kg)   LMP 11/10/2019   BMI 43.55 kg/m  Physical Exam Constitutional:      General: She is not in acute distress.    Appearance: Normal appearance. She is well-developed.  Chest:     Chest wall: No mass, tenderness or edema.     Breasts:        Right: Mass present. No inverted nipple, nipple discharge, skin change or tenderness.        Left: No inverted nipple, mass, nipple discharge, skin change or tenderness.     Comments: NT soft elongated mass in UQ of right breast No mass on left Musculoskeletal:        General: Normal range of motion.  Lymphadenopathy:     Upper Body:     Right upper body: No pectoral adenopathy.     Left upper body: No pectoral adenopathy.  Neurological:     Mental Status: She is alert and oriented to person, place, and time.  Skin:    General:  Skin is warm and dry.     Findings: No abrasion, bruising, erythema, lesion or rash.  Psychiatric:        Speech: Speech normal.        Behavior: Behavior normal.        Judgment: Judgment normal.  Vitals reviewed.    ASSESSMENT/PLAN: breast mass     (new problem, work up planned)  1.  Breast mass, right - Plan work up w [facility] and [place] - US BREAST LTD UNI RIGHT INC AXILLA; Future - MM DIAG BREAST TOMO UNI RIGHT; Future - Counseled as to risks of cysts, cancer, hormonal changes, and  other etiologies for this breast mass.  A total of 20 minutes were spent face-to-face with the patient as well as preparation, review, communication, and documentation during this encounter.  Barnett Applebaum, MD, [facility], [facility] 12/11/2019  2:07 PM

## 2019-12-11 NOTE — Patient Instructions (Signed)
Breast Ultrasound Breast ultrasound, or breast ultrasonogram, is a procedure that is used to check for breast problems. A breast ultrasound uses sound waves and a computer to create pictures of the inside of a breast. This procedure can help determine whether a lump in your breast is a fluid-filled sac (cyst), a solid tumor, or a growth. Sometimes a breast ultrasound is done to locate nodules in the breast that are too small to feel but need to be removed during surgery. Tell a health care provider about:  Any allergies you have.  All medicines you are taking, including vitamins, herbs, eye drops, creams, and over-the-counter medicines.  Any blood disorders you have.  Any surgeries you have had.  Any medical conditions you have or have had. What are the risks? Breast ultrasound is safe and painless. Ultrasound imaging does not use X-rays. There are no known risks for this procedure. What happens before the procedure? You do not need to prepare for a breast ultrasound. You will undress from your waist up, so wear loose, comfortable clothing on the day of the procedure. What happens during the procedure?   You will lie down on an exam table.  A health care provider will apply gel to your breast area.  You may be asked to hold your arm above your head.  The health care provider will move a handheld probe, which looks like a microphone, back and forth over your breast.  The probe will send signals to a computer that will create images of your breast.  When the procedure is over, the gel will be cleaned from your breast, and you can get dressed. What can I expect after the procedure?  You can go home right away and do all of your usual activities.  It is up to you to get the results of your procedure. Ask your health care provider, or the department that is doing the procedure, when your results will be ready. Summary  A breast ultrasound is a procedure that is used to check for  breast problems.  This procedure uses sound waves and a computer to create pictures of the inside of your breast.  You can go home right away and do all of your usual activities. Make sure you know how to get the results of your procedure. This information is not intended to replace advice given to you by your health care provider. Make sure you discuss any questions you have with your health care provider. Document Revised: 02/19/2019 Document Reviewed: 02/19/2019 Elsevier Patient Education  2020 Elsevier Inc.  

## 2019-12-13 ENCOUNTER — Ambulatory Visit
Admission: RE | Admit: 2019-12-13 | Discharge: 2019-12-13 | Disposition: A | Discharge status: Home / Self Care | Payer: BC Managed Care – PPO | Source: Ambulatory Visit | Attending: Obstetrics & Gynecology | Admitting: Obstetrics & Gynecology

## 2019-12-13 DIAGNOSIS — N6310 Unspecified lump in the right breast, unspecified quadrant: Secondary | ICD-10-CM | POA: Diagnosis not present

## 2020-01-07 ENCOUNTER — Other Ambulatory Visit: Payer: Self-pay | Admitting: Obstetrics & Gynecology

## 2020-01-07 DIAGNOSIS — N6310 Unspecified lump in the right breast, unspecified quadrant: Secondary | ICD-10-CM

## 2020-01-17 NOTE — Progress Notes (Signed)
Chief Complaint  Patient presents with  . Gynecologic Exam    pt is still having long and heavy cycles     HPI:      [name] is a 54 y.o. [phone] who LMP was Patient's last menstrual period was 01/03/2020 (exact date)., presents today for her annual examination.  Her menses are regular every 3-4 wks, lasting 2-3 wks (for past 4 months), mod flow with clots. Were only 4 days last yr. Dysmenorrhea mild to mod, relieved with midol. She does have vasomotor sx. Hx of anemia on 12/20 labs. Normal TSH 2/21. Taking Fe supp. No hx of HTN, DVTs, migraines with aura. Did OCPs in distant past but didn't do well with them.   Sex activity: not currently sex active due to bleeding, contraception - vasectomy.  Last Pap: 12/21/16  Results were: no abnormalities /neg HPV DNA  Hx of STDs: none  Last mammogram: 12/13/19 Results were: normal--routine follow-up in 12 months. Felt RT breast mass 6/21 with neg eval. Still feels it--more ropey than discrete mass.  There is a FH of breast cancer in her mom, genetic testing not done. Pt doesn't meet current insurance guidelines re: cancer genetic testing. There is no FH of ovarian cancer. The patient doesdo self-breast exams.  Tobacco use: The patient denies current or previous tobacco use. Alcohol use: none  Drug use: none Exercise: min active  Colonoscopy: 2019 with Dr. Allen Norris; not sure when repeat due per pt.  She does get adequate calcium and Vitamin D in her diet. No recent DM screening/fasting labs.   Past Medical History:  Diagnosis Date  . Anxiety   . Breast cyst   . Degenerative disc disease, lumbar   . Depression   . History of mammogram 05/2012; 09/24/2014   wnl per pt; benign  . History of Papanicolaou smear of cervix 08/15/2012   -/-  . Hyperlipemia   . Vitamin D deficiency     Past Surgical History:  Procedure Laterality Date  . BACK SURGERY     L5  . BREAST BIOPSY Left 2016   NEG  . COLONOSCOPY WITH PROPOFOL N/A  04/27/2018   Procedure: COLONOSCOPY WITH BIOPSIES;  Surgeon: Lucilla Lame, MD;  Location: [facility];  Service: Endoscopy;  Laterality: N/A;  . ESOPHAGOGASTRODUODENOSCOPY (EGD) WITH PROPOFOL N/A 04/27/2018   Procedure: ESOPHAGOGASTRODUODENOSCOPY (EGD) WITH BIOPSIES;  Surgeon: Lucilla Lame, MD;  Location: [facility];  Service: Endoscopy;  Laterality: N/A;  . ESOPHAGOGASTRODUODENOSCOPY (EGD) WITH PROPOFOL N/A 06/29/2019   Procedure: ESOPHAGOGASTRODUODENOSCOPY (EGD) WITH PROPOFOL;  Surgeon: Lucilla Lame, MD;  Location: [facility];  Service: Endoscopy;  Laterality: N/A;  . 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   Marland Kitchen POLYPECTOMY N/A 04/27/2018   Procedure: POLYPECTOMY;  Surgeon: Lucilla Lame, MD;  Location: [facility];  Service: Endoscopy;  Laterality: N/A;    Family History  Problem Relation Age of Onset  . Breast cancer Mother 17  . Hypertension Father   . Kidney disease Father   . Diabetes Father        type 2  . Heart disease Father     Social History   Socioeconomic History  . Marital status: Married    Spouse name: Not on file  . Number of children: Not on file  . Years of education: 1  . Highest education level: Not on file  Occupational History  . Occupation: Pharmacist, hospital  Tobacco Use  . Smoking status: Never Smoker  . Smokeless tobacco:  Never Used  Vaping Use  . Vaping Use: Never used  Substance and Sexual Activity  . Alcohol use: No  . Drug use: No  . Sexual activity: Not Currently    Birth control/protection: Surgical, Other-see comments    Comment: vasectomy  Other Topics Concern  . Not on file  Social History Narrative  . Not on file   Social Determinants of Health   Financial Resource Strain:   . Difficulty of Paying Living Expenses:   Food Insecurity:   . Worried About Charity fundraiser in the Last Year:   . Arboriculturist in the Last Year:   Transportation Needs:   . Film/video editor (Medical):   Marland Kitchen Lack of  Transportation (Non-Medical):   Physical Activity:   . Days of Exercise per Week:   . Minutes of Exercise per Session:   Stress:   . Feeling of Stress :   Social Connections:   . Frequency of Communication with Friends and Family:   . Frequency of Social Gatherings with Friends and Family:   . Attends Religious Services:   . Active Member of Clubs or Organizations:   . Attends Archivist Meetings:   Marland Kitchen Marital Status:   Intimate Partner Violence:   . Fear of Current or Ex-Partner:   . Emotionally Abused:   Marland Kitchen Physically Abused:   . Sexually Abused:      Current Outpatient Medications:  .  acetaminophen (TYLENOL) 325 MG tablet, Take 650 mg by mouth every 6 (six) hours as needed for moderate pain., Disp: , Rfl:  .  Ascorbic Acid (VITAMIN C) 1000 MG tablet, Take 1,000 mg by mouth daily. , Disp: , Rfl:  .  b complex vitamins tablet, Take 1 tablet by mouth daily., Disp: , Rfl:  .  B Complex-Biotin-FA (SUPER QUINTS B-50) TABS, Take 1 tablet by mouth daily., Disp: , Rfl:  .  Cholecalciferol (VITAMIN D-1000 MAX ST) 1000 units tablet, Take 1,000 Units by mouth daily. , Disp: , Rfl:  .  dicyclomine (BENTYL) 20 MG tablet, Take 1 tablet (20 mg total) by mouth 3 (three) times daily before meals., Disp: 90 tablet, Rfl: 3 .  ferrous sulfate 325 (65 FE) MG tablet, Take 325 mg by mouth daily with breakfast., Disp: , Rfl:  .  fluticasone (FLONASE) 50 MCG/ACT nasal spray, Place 1 spray into both nostrils 2 (two) times daily., Disp: 16 g, Rfl: 6 .  sertraline (ZOLOFT) 100 MG tablet, TAKE 1 TABLET BY MOUTH EVERY DAY (Patient taking differently: Take 100 mg by mouth daily. ), Disp: 90 tablet, Rfl: 1 .  vitamin B-12 (CYANOCOBALAMIN) 500 MCG tablet, Take 500 mcg by mouth daily., Disp: , Rfl:  .  zinc gluconate 50 MG tablet, Take by mouth., Disp: , Rfl:   ROS: Review of Systems  Constitutional: Negative for fatigue, fever and unexpected weight change.  Respiratory: Negative for cough, shortness  of breath and wheezing.   Cardiovascular: Negative for chest pain, palpitations and leg swelling.  Gastrointestinal: Negative for blood in stool, constipation, diarrhea, nausea and vomiting.  Endocrine: Negative for cold intolerance, heat intolerance and polyuria.  Genitourinary: Positive for menstrual problem. Negative for dyspareunia, dysuria, flank pain, frequency, genital sores, hematuria, pelvic pain, urgency, vaginal bleeding, vaginal discharge and vaginal pain.  Musculoskeletal: Negative for back pain, joint swelling and myalgias.  Skin: Negative for rash.  Neurological: Negative for dizziness, syncope, light-headedness, numbness and headaches.  Hematological: Negative for adenopathy.  Psychiatric/Behavioral: Negative for agitation,  confusion, sleep disturbance and suicidal ideas. The patient is not nervous/anxious.     Objective: BP 116/80   Ht 5' (1.524 m)   Wt 214 lb (97.1 kg)   LMP 01/03/2020 (Exact Date)   BMI 41.79 kg/m    Physical Exam   Physical Exam Constitutional:      Appearance: She is well-developed.  Genitourinary:     Vulva, vagina, cervix, uterus, right adnexa and left adnexa normal.     No vulval lesion or tenderness noted.     No vaginal discharge, erythema or tenderness.     No cervical polyp.     Uterus is not enlarged or tender.     No right or left adnexal mass present.     Right adnexa not tender.     Left adnexa not tender.  Neck:     Thyroid: No thyromegaly.  Cardiovascular:     Rate and Rhythm: Normal rate and regular rhythm.     Heart sounds: Normal heart sounds. No murmur heard.   Pulmonary:     Effort: Pulmonary effort is normal.     Breath sounds: Normal breath sounds.  Chest:     Breasts:        Right: No mass, nipple discharge, skin change or tenderness.        Left: No mass, nipple discharge, skin change or tenderness.  Abdominal:     Palpations: Abdomen is soft.     Tenderness: There is no abdominal tenderness. There is no  guarding.  Musculoskeletal:        General: Normal range of motion.     Cervical back: Normal range of motion.  Neurological:     General: No focal deficit present.     Mental Status: She is alert and oriented to person, place, and time.     Cranial Nerves: No cranial nerve deficit.  Skin:    General: Skin is warm and dry.  Psychiatric:        Mood and Affect: Mood normal.        Behavior: Behavior normal.        Thought Content: Thought content normal.        Judgment: Judgment normal.  Vitals reviewed.    Endometrial Biopsy After discussion with the patient regarding her abnormal uterine bleeding I recommended that she proceed with an endometrial biopsy for further diagnosis. The risks, benefits, alternatives, and indications for an endometrial biopsy were discussed with the patient in detail.  Verbal consent was obtained.   PROCEDURE NOTE:  [place] endometrial biopsy was performed using aseptic technique with iodine preparation.  The uterus was sounded to a length of 8.0 cm.  Adequate sampling was obtained with minimal blood loss.  The patient tolerated the procedure well.  Disposition will be pending pathology.  Assessment/Plan: Encounter for annual routine gynecological examination  Encounter for screening mammogram for malignant neoplasm of breast; pt current on mammo  Breast mass, right--no discrete mass on breast exam, neg mammo/u/s. Reassurance. Cont to follow expectantly.  Abnormal uterine bleeding (AUB) - Plan: Surgical pathology, CBC with Differential/Platelet, Check CBC, EMB, Gyn u/s. Will call with results. Discussed hormones, IUD, ablation, hyst. Will depend on results.   Screening for iron deficiency anemia - Plan: CBC with Differential/Platelet,   Screening for diabetes mellitus - Plan: Hemoglobin A1c             GYN counsel breast self exam, mammography screening, adequate intake of calcium and vitamin D  F/U  Return in about 3 days (around 01/21/2020)  for GYN u/s for AUB--[facility] to call pt.  Ayushi Pla B. Zackariah Vanderpol, PA-C 01/18/2020 9:31 AM

## 2020-01-18 ENCOUNTER — Other Ambulatory Visit (HOSPITAL_COMMUNITY)
Admission: RE | Admit: 2020-01-18 | Discharge: 2020-01-18 | Disposition: A | Discharge status: Home / Self Care | Payer: BC Managed Care – PPO | Source: Ambulatory Visit | Attending: Obstetrics and Gynecology | Admitting: Obstetrics and Gynecology

## 2020-01-18 ENCOUNTER — Encounter: Payer: Self-pay | Admitting: Obstetrics and Gynecology

## 2020-01-18 ENCOUNTER — Other Ambulatory Visit: Payer: Self-pay

## 2020-01-18 ENCOUNTER — Ambulatory Visit (INDEPENDENT_AMBULATORY_CARE_PROVIDER_SITE_OTHER): Payer: BC Managed Care – PPO | Admitting: Obstetrics and Gynecology

## 2020-01-18 VITALS — BP 116/80 | Ht 60.0 in | Wt 214.0 lb

## 2020-01-18 DIAGNOSIS — N6310 Unspecified lump in the right breast, unspecified quadrant: Secondary | ICD-10-CM | POA: Diagnosis not present

## 2020-01-18 DIAGNOSIS — N939 Abnormal uterine and vaginal bleeding, unspecified: Secondary | ICD-10-CM | POA: Insufficient documentation

## 2020-01-18 DIAGNOSIS — Z130 Encounter for screening for diseases of the blood and blood-forming organs and certain disorders involving the immune mechanism: Secondary | ICD-10-CM

## 2020-01-18 DIAGNOSIS — Z1231 Encounter for screening mammogram for malignant neoplasm of breast: Secondary | ICD-10-CM | POA: Diagnosis not present

## 2020-01-18 DIAGNOSIS — Z01419 Encounter for gynecological examination (general) (routine) without abnormal findings: Secondary | ICD-10-CM

## 2020-01-18 DIAGNOSIS — Z131 Encounter for screening for diabetes mellitus: Secondary | ICD-10-CM

## 2020-01-18 NOTE — Patient Instructions (Signed)
I value your feedback and entrusting us with your care. If you get a [facility] patient survey, I would appreciate you taking the time to let us know about your experience today. Thank you!  As of June 21, 2019, your lab results will be released to your MyChart immediately, before I even have a chance to see them. Please give me time to review them and contact you if there are any abnormalities. Thank you for your patience.  

## 2020-01-19 LAB — CBC WITH DIFFERENTIAL/PLATELET
Basophils Absolute: 0.1 x10E3/uL (ref 0.0–0.2)
Basos: 1 %
EOS (ABSOLUTE): 0.2 x10E3/uL (ref 0.0–0.4)
Eos: 3 %
Hematocrit: 43.2 % (ref 34.0–46.6)
Hemoglobin: 14.2 g/dL (ref 11.1–15.9)
Immature Grans (Abs): 0 x10E3/uL (ref 0.0–0.1)
Immature Granulocytes: 0 %
Lymphocytes Absolute: 2.4 x10E3/uL (ref 0.7–3.1)
Lymphs: 37 %
MCH: 30.5 pg (ref 26.6–33.0)
MCHC: 32.9 g/dL (ref 31.5–35.7)
MCV: 93 fL (ref 79–97)
Monocytes Absolute: 0.6 x10E3/uL (ref 0.1–0.9)
Monocytes: 9 %
Neutrophils Absolute: 3.3 x10E3/uL (ref 1.4–7.0)
Neutrophils: 50 %
Platelets: 233 x10E3/uL (ref 150–450)
RBC: 4.65 x10E6/uL (ref 3.77–5.28)
RDW: 13.3 % (ref 11.7–15.4)
WBC: 6.6 x10E3/uL (ref 3.4–10.8)

## 2020-01-19 LAB — HEMOGLOBIN A1C

## 2020-01-21 NOTE — Addendum Note (Signed)
Addended by: Ardeth Perfect B on: 6/38/4665 03:14 PM   Modules accepted: Orders

## 2020-01-22 ENCOUNTER — Telehealth: Payer: Self-pay | Admitting: Obstetrics and Gynecology

## 2020-01-22 DIAGNOSIS — C541 Malignant neoplasm of endometrium: Secondary | ICD-10-CM

## 2020-01-22 LAB — SURGICAL PATHOLOGY

## 2020-01-22 NOTE — Telephone Encounter (Signed)
I received a call from Dr. 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  Will refer to Gyn-onc. Will also cancel GYN u/s for later this wk. Pt aware. Questions answered.

## 2020-01-23 ENCOUNTER — Encounter: Payer: Self-pay | Admitting: Obstetrics and Gynecology

## 2020-01-23 DIAGNOSIS — C541 Malignant neoplasm of endometrium: Secondary | ICD-10-CM

## 2020-01-23 NOTE — Telephone Encounter (Signed)
Per Delena Serve, pt can most likely be seen with Erling Conte at [facility] next Wed, July 21. She will confirm with pt.  Pt would also like 2nd opinion with Dr. Andrew Au at [facility]. Spoke with Bryson Ha, scheduler there. We need to send notes/path etc to fax: [phone], Edwyna Ready. They can sched her in 1-2 wks after reviewing notes. Dr. Marlaine Hind might be closer to 2 wks.  Pt understands and is appreciative. She is anxious to move forward with tx.

## 2020-01-24 NOTE — Telephone Encounter (Signed)
Faxed clinicals to Dr. Marlaine Hind @ [facility] @ [phone], Attn: Ebony Hail

## 2020-01-25 ENCOUNTER — Ambulatory Visit: Payer: BC Managed Care – PPO

## 2020-01-25 ENCOUNTER — Other Ambulatory Visit: Payer: Self-pay

## 2020-01-25 DIAGNOSIS — Z803 Family history of malignant neoplasm of breast: Secondary | ICD-10-CM

## 2020-01-25 NOTE — Progress Notes (Signed)
Pt was here for [facility] blood drawn. Pt tolerated it well.

## 2020-01-28 ENCOUNTER — Telehealth: Payer: Self-pay

## 2020-01-28 NOTE — Telephone Encounter (Signed)
Spoke with Ms. Tessmer and appointment arranged with Dr. Theora Gianotti for 7/21 at 1515. Directions given to the cancer center.

## 2020-01-30 ENCOUNTER — Other Ambulatory Visit: Payer: Self-pay

## 2020-01-30 ENCOUNTER — Inpatient Hospital Stay: Payer: BC Managed Care – PPO | Attending: Obstetrics and Gynecology | Admitting: Obstetrics and Gynecology

## 2020-01-30 VITALS — BP 129/92 | HR 93 | Temp 98.6°F | Resp 18 | Ht 60.0 in | Wt 213.9 lb

## 2020-01-30 DIAGNOSIS — F419 Anxiety disorder, unspecified: Secondary | ICD-10-CM | POA: Insufficient documentation

## 2020-01-30 DIAGNOSIS — C541 Malignant neoplasm of endometrium: Secondary | ICD-10-CM | POA: Insufficient documentation

## 2020-01-30 DIAGNOSIS — R32 Unspecified urinary incontinence: Secondary | ICD-10-CM | POA: Insufficient documentation

## 2020-01-30 DIAGNOSIS — N889 Noninflammatory disorder of cervix uteri, unspecified: Secondary | ICD-10-CM | POA: Diagnosis not present

## 2020-01-30 DIAGNOSIS — E785 Hyperlipidemia, unspecified: Secondary | ICD-10-CM | POA: Diagnosis not present

## 2020-01-30 DIAGNOSIS — M549 Dorsalgia, unspecified: Secondary | ICD-10-CM | POA: Diagnosis not present

## 2020-01-30 DIAGNOSIS — F329 Major depressive disorder, single episode, unspecified: Secondary | ICD-10-CM | POA: Diagnosis not present

## 2020-01-30 DIAGNOSIS — Z9884 Bariatric surgery status: Secondary | ICD-10-CM | POA: Diagnosis not present

## 2020-01-30 DIAGNOSIS — E559 Vitamin D deficiency, unspecified: Secondary | ICD-10-CM | POA: Diagnosis not present

## 2020-01-30 DIAGNOSIS — G8929 Other chronic pain: Secondary | ICD-10-CM | POA: Diagnosis not present

## 2020-01-30 DIAGNOSIS — Z79899 Other long term (current) drug therapy: Secondary | ICD-10-CM | POA: Diagnosis not present

## 2020-01-30 DIAGNOSIS — N898 Other specified noninflammatory disorders of vagina: Secondary | ICD-10-CM

## 2020-01-30 LAB — WET PREP, GENITAL
Clue Cells Wet Prep HPF POC: NONE SEEN
Sperm: NONE SEEN
Trich, Wet Prep: NONE SEEN
Yeast Wet Prep HPF POC: NONE SEEN

## 2020-01-30 NOTE — Patient Instructions (Signed)
We will call you with you pre-admission testing appointment/COVID test.             DIVISION OF GYNECOLOGIC ONCOLOGY BOWEL PREP   The following instructions are extremely important to prepare for your surgery. Please follow them carefully   Step 1: Liquid Diet Instructions              Clear Liquid Diet for GYN Oncology Patients Day Before Surgery The day before your scheduled surgery DO NOT EAT any solid foods.  We do want you to drink enough liquids, but NO MILK products.  We do not want you to be dehydrated.  Clear liquids are defined as no milk products and no pieces of any solid food. Drink at least 64 oz. of fluid.  The following are all approved for you to drink the day before you surgery.  Chicken, Beef or Vegetable Broth (bouillon or consomm) - NO BROTH AFTER MIDNIGHT  Plain Jello  (no fruit)  Water  Strained lemonade or fruit punch  Gatorade (any flavor)  CLEAR Ensure or Boost Breeze  Fruit juices without pulp, such as apple, grape, or cranberry juice  Clear sodas - NO SODA AFTER MIDNIGHT  Ice Pops without bits of fruit or fruit pulp  Honey  Tea or coffee without milk or cream                 Any foods not on the above list should be avoided                                                                                             Step 2: Laxatives           The evening before surgery:   Time: around 5pm   Follow these instructions carefully.   Administer 1 Dulcolax suppository according to manufacturer instructions on the box. You will need to purchase this laxative at a pharmacy or grocery store.    Individual responses to laxatives vary; this prep may cause multiple bowel movements. It often works in 30 minutes and may take as long as 3 hours. Stay near an available bathroom.    It is important to stay hydrated. Ensure you are still drinking clear liquids.     IMPORTANT: FOR YOUR SAFETY, WE WILL HAVE TO CANCEL YOUR SURGERY IF YOU DO NOT FOLLOW  THESE INSTRUCTIONS.    Do not eat anything after midnight (including gum or candy) prior to your surgery.  Avoid drinking carbonated beverages after midnight.  You can have clear liquids up until one hour before you arrive at the hospital. "Nothing by mouth" means no liquids, gum, candy, etc for one hour before your arrival time.      Laparoscopy Laparoscopy is a procedure to diagnose diseases in the abdomen. During the procedure, a thin, lighted, pencil-sized instrument called a laparoscope is inserted into the abdomen through an incision. The laparoscope allows your health care provider to look at the organs inside your body. LET [facility] CARE PROVIDER KNOW ABOUT:  Any allergies you have.  All medicines you are taking, including vitamins, herbs, eye drops, creams,  and over-the-counter medicines.  Previous problems you or members of your family have had with the use of anesthetics.  Any blood disorders you have.  Previous surgeries you have had.  Medical conditions you have. RISKS AND COMPLICATIONS  Generally, this is a safe procedure. However, problems can occur, which may include:  Infection.  Bleeding.  Damage to other organs.  Allergic reaction to the anesthetics used during the procedure. BEFORE THE PROCEDURE  Do not eat or drink anything after midnight on the night before the procedure or as directed by your health care provider.  Ask your health care provider about: ? Changing or stopping your regular medicines. ? Taking medicines such as aspirin and ibuprofen. These medicines can thin your blood. Do not take these medicines before your procedure if your health care provider instructs you not to.  Plan to have someone take you home after the procedure. PROCEDURE  You may be given a medicine to help you relax (sedative).  You will be given a medicine to make you sleep (general anesthetic).  Your abdomen will be inflated with a gas. This will make your  organs easier to see.  Small incisions will be made in your abdomen.  A laparoscope and other small instruments will be inserted into the abdomen through the incisions.  A tissue sample may be removed from an organ in the abdomen for examination.  The instruments will be removed from the abdomen.  The gas will be released.  The incisions will be closed with stitches (sutures). AFTER THE PROCEDURE  Your blood pressure, heart rate, breathing rate, and blood oxygen level will be monitored often until the medicines you were given have worn off.   This information is not intended to replace advice given to you by your health care provider. Make sure you discuss any questions you have with your health care provider.                                             Bowel Symptoms After Surgery After gynecologic surgery, women often have temporary changes in bowel function (constipation and gas pain).  Following are tips to help prevent and treat common bowel problems.  It also tells you when to call the doctor.  This is important because some symptoms might be a sign of a more serious bowel problem such as obstruction (bowel blockage).  These problems are rare but can happen after gynecologic surgery.   Besides surgery, what can temporarily affect bowel function? 1. Dietary changes   2. Decreased physical activity   3.Antibiotics   4. Pain medication   How can I prevent constipation (three days or more without a stool)? 1. Include fiber in your diet: whole grains, raw or dried fruits & vegetables, prunes, prune/pear juiceDrink at least 8 glasses of liquid (preferably water) every day 2. Avoid: ? Gas forming foods such as broccoli, beans, peas, salads, cabbage, sweet potatoes ? Greasy, fatty, or fried foods 3. Activity helps bowel function return to normal, walk around the house at least 3-4 times each day for 15 minutes or longer, if tolerated.  Rocking in a rocking chair is preferable to  sitting still. 4. Stool softeners: these are not laxatives, but serve to soften the stool to avoid straining.  Take 2-4 times a day until normal bowel function returns  Examples: Colace or generic equivalent (Docusate) 5. Bulk laxatives: provide a concentrated source of fiber.  They do not stimulate the bowel.  Take 1-2 times each day until normal bowel function return.              Examples: Citrucel, Metamucil, Fiberal, Fibercon   What can I take for "Gas Pains"? 1. Simethicone (Mylicon, Gas-X, Maalox-Gas, Mylanta-Gas) take 3-4 times a day 2. Maalox Regular - take 3-4 times a day 3. Mylanta Regular - take 3-4 times a day   What can I take if I become constipated? 1. Start with stool softeners and add additional laxatives below as needed to have a bowel movement every 1-2 days  2. Stool softeners 1-2 tablets, 2 times a day 3. Senokot 1-2 tablets, 1-2 times a day 4. Glycerin suppository can soften hard stool take once a day 5. Bisacodyl suppository once a day  6. Milk of Magnesia 30 mL 1-2 times a day 7. Fleets or tap water enema    What can I do for nausea?  1. Limit most solid foods for 24-48 hours 2. Continue eating small frequent amounts of liquids and/or bland soft foods ? Toast, crackers, cooked cereal (grits, cream of wheat, rice) 3. Benadryl: a mild anti-nausea medicine can be obtained without a prescription. May cause drowsiness, especially if taken with narcotic pain medicines 4. Contact provider for prescription nausea medication     What can I do, or take for diarrhea (more than five loose stools per day)? 1. Drink plenty of clear fluids to prevent dehydration 2. May take Kaopectate, Pepto-Bismol, Imodium, or probiotics for 1-2 days 3. Anusol or Preparation-H can be helpful for hemorrhoids and irritated tissue around anus   When should I call the doctor?             CONSTIPATION:   Not relieved after three days following the above program VOMITING:  That  contains blood, "coffee ground" material  More the three times/hour and unable to keep down nausea medication for more than eight hours  With dry mouth, dark or strong urine, feeling light-headed, dizzy, or confused  With severe abdominal pain or bloating for more than 24 hours DIARRHEA:  That continues for more then 24-48 hours despite treatment  That contains blood or tarry material  With dry mouth, dark or strong urine, feeling light~headed, dizzy, or confused FEVER:  101 F or higher along with nausea, vomiting, gas pain, diarrhea UNABLE TO:  Pass gas from rectum for more than 24 hours  Tolerate liquids by mouth for more than 24 hours        Laparoscopic Hysterectomy, Care After Refer to this sheet in the next few weeks. These instructions provide you with information on caring for yourself after your procedure. Your health care provider may also give you more specific instructions. Your treatment has been planned according to current medical practices, but problems sometimes occur. Call your health care provider if you have any problems or questions after your procedure. What can I expect after the procedure?  Pain and bruising at the incision sites. You will be given pain medicine to control it.  Menopausal symptoms such as hot flashes, night sweats, and insomnia if your ovaries were removed.  Sore throat from the breathing tube that was inserted during surgery. Follow these instructions at home:  Only take over-the-counter or prescription medicines for pain, discomfort, or fever as directed by your health care provider.  Do not take aspirin. It can cause bleeding.  Do not drive when taking pain medicine.  Follow your health care provider's advice regarding diet, exercise, lifting, driving, and general activities.  Resume your usual diet as directed and allowed.  Get plenty of rest and sleep.  Do not douche, use tampons, or have sexual intercourse for at least 6  weeks, or until your health care provider gives you permission.  Change your bandages (dressings) as directed by your health care provider.  Monitor your temperature and notify your health care provider of a fever.  Take showers instead of baths for 2-3 weeks.  Do not drink alcohol until your health care provider gives you permission.  If you develop constipation, you may take a mild laxative with your health care provider's permission. Bran foods may help with constipation problems. Drinking enough fluids to keep your urine clear or pale yellow may help as well.  Try to have someone home with you for 1-2 weeks to help around the house.  Keep all of your follow-up appointments as directed by your health care provider. Contact a health care provider if:  You have swelling, redness, or increasing pain around your incision sites.  You have pus coming from your incision.  You notice a bad smell coming from your incision.  Your incision breaks open.  You feel dizzy or lightheaded.  You have pain or bleeding when you urinate.  You have persistent diarrhea.  You have persistent nausea and vomiting.  You have abnormal vaginal discharge.  You have a rash.  You have any type of abnormal reaction or develop an allergy to your medicine.  You have poor pain control with your prescribed medicine. Get help right away if:  You have chest pain or shortness of breath.  You have severe abdominal pain that is not relieved with pain medicine.  You have pain or swelling in your legs. This information is not intended to replace advice given to you by your health care provider. Make sure you discuss any questions you have with your health care provider. Document Released: 04/18/2013 Document Revised: 12/04/2015 Document Reviewed: 01/16/2013 [employer] Interactive Patient Education  2017 [employer].

## 2020-01-30 NOTE — H&P (View-Only) (Signed)
Gynecologic Oncology History and Physical Referring Provider: Ardeth Perfect, PA  Chief Complaint: Endometrioid Carcinoma  Subjective:  Susan Strickland is a 54 y.o. female who is seen in consultation from [facility], [place] for newly diagnosed endometrioid carcinoma.   She initially presented for AUB s/p endometrial biopsy on 01/18/20.   A. ENDOMETRIUM, BIOPSY:  - Focal endometrioid carcinoma, FIGO grade 1, arising in complex atypical hyperplasia.   Per patient, she has not had a year without menstrual bleeding. Reports saturating 3-5 pads per day with large clots for 2 weeks/month. Has associated fatigue, history of iron deficiency anemia and is on oral iron. Also reports night sweats and chronic back pain.   She presents for definitive management   Problem List: Patient Active Problem List   Diagnosis Date Noted  . Endometrial cancer ([place]) 01/30/2020  . Cervical lesion 01/30/2020  . Urinary incontinence 01/30/2020  . Vaginal discharge 01/30/2020  . Abnormal uterine bleeding (AUB) 01/18/2020  . Breast mass, right 12/11/2019  . Abdominal pain   . BMI 40.0-44.9, adult ([place]) 02/19/2019  . Encounter for screening mammogram for breast cancer   . Intractable vomiting   . Acute gastritis without hemorrhage   . S/P bariatric surgery 04/25/2017  . Vitamin B12 nutritional deficiency 04/25/2017  . Obesity (BMI 30-39.9) 04/25/2017  . Normocytic anemia 04/25/2017  . Hyperlipidemia 04/25/2017  . Abnormal mammogram 02/01/2017  . PMDD (premenstrual dysphoric disorder) 10/21/2015  . Vitamin D deficiency 10/21/2015  . Degeneration of intervertebral disc of lumbar region 12/23/2014  . Calculus of kidney 04/29/2014  . L-S radiculopathy 11/04/2011    Past Medical History: Past Medical History:  Diagnosis Date  . Anxiety   . Breast cyst   . Degenerative disc disease, lumbar   . Depression   . History of mammogram 05/2012; 09/24/2014   wnl per pt; benign  . History of Papanicolaou smear  of cervix 08/15/2012   -/-  . Hyperlipemia   . Vitamin D deficiency     Past Surgical History: Past Surgical History:  Procedure Laterality Date  . BACK SURGERY     L5  . BREAST BIOPSY Left 2016   NEG  . COLONOSCOPY WITH PROPOFOL N/A 04/27/2018   Procedure: COLONOSCOPY WITH BIOPSIES;  Surgeon: Lucilla Lame, MD;  Location: [facility];  Service: Endoscopy;  Laterality: N/A;  . ESOPHAGOGASTRODUODENOSCOPY (EGD) WITH PROPOFOL N/A 04/27/2018   Procedure: ESOPHAGOGASTRODUODENOSCOPY (EGD) WITH BIOPSIES;  Surgeon: Lucilla Lame, MD;  Location: [facility];  Service: Endoscopy;  Laterality: N/A;  . ESOPHAGOGASTRODUODENOSCOPY (EGD) WITH PROPOFOL N/A 06/29/2019   Procedure: ESOPHAGOGASTRODUODENOSCOPY (EGD) WITH PROPOFOL;  Surgeon: Lucilla Lame, MD;  Location: [facility];  Service: Endoscopy;  Laterality: N/A;  . 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   Marland Kitchen POLYPECTOMY N/A 04/27/2018   Procedure: POLYPECTOMY;  Surgeon: Lucilla Lame, MD;  Location: [facility];  Service: Endoscopy;  Laterality: N/A;    Past Gynecologic History:  Menarche: age 45 Pain with menses Last Menstrual Period:   OB History:  OB History  Gravida Para Term Preterm AB Living  4 3 3   1 3   SAB TAB Ectopic Multiple Live Births  1       3    # Outcome Date GA Lbr Len/2nd Weight Sex Delivery Anes PTL Lv  4 Term 07/07/92          3 Term 04/21/89          2 SAB 07/13/87  1 Term 11/20/85            Obstetric Comments  1st Menstrual Cycle:  14  1st Pregnancy:  19    Family History: h/o VTE in her daughter associated with leg fracture and immobility.  Family History  Problem Relation Age of Onset  . Breast cancer Mother 80  . Hypertension Father   . Kidney disease Father   . Diabetes Father        type 2  . Heart disease Father     Social History: Social History   Socioeconomic History  . Marital status: Married    Spouse name: Not on file  . Number of children: Not  on file  . Years of education: 13  . Highest education level: Not on file  Occupational History  . Occupation: Pharmacist, hospital  Tobacco Use  . Smoking status: Never Smoker  . Smokeless tobacco: Never Used  Vaping Use  . Vaping Use: Never used  Substance and Sexual Activity  . Alcohol use: No  . Drug use: No  . Sexual activity: Not Currently    Birth control/protection: Surgical, Other-see comments    Comment: vasectomy  Other Topics Concern  . Not on file  Social History Narrative  . Not on file   Social Determinants of Health   Financial Resource Strain:   . Difficulty of Paying Living Expenses:   Food Insecurity:   . Worried About Charity fundraiser in the Last Year:   . Arboriculturist in the Last Year:   Transportation Needs:   . Film/video editor (Medical):   Marland Kitchen Lack of Transportation (Non-Medical):   Physical Activity:   . Days of Exercise per Week:   . Minutes of Exercise per Session:   Stress:   . Feeling of Stress :   Social Connections:   . Frequency of Communication with Friends and Family:   . Frequency of Social Gatherings with Friends and Family:   . Attends Religious Services:   . Active Member of Clubs or Organizations:   . Attends Archivist Meetings:   Marland Kitchen Marital Status:   Intimate Partner Violence:   . Fear of Current or Ex-Partner:   . Emotionally Abused:   Marland Kitchen Physically Abused:   . Sexually Abused:     Allergies: Allergies  Allergen Reactions  . Lac Bovis Nausea And Vomiting       . Doxycycline Nausea Only    Hot flashes, vivid dreams  . Milk-Related Compounds Nausea And Vomiting       . Sweetness Enhancer     headaches  . Azithromycin Rash    Current Medications: Current Outpatient Medications  Medication Sig Dispense Refill  . acetaminophen (TYLENOL) 325 MG tablet Take 650 mg by mouth every 6 (six) hours as needed for moderate pain.    . Ascorbic Acid (VITAMIN C) 1000 MG tablet Take 1,000 mg by mouth daily.     Marland Kitchen b  complex vitamins tablet Take 1 tablet by mouth daily.    . Cholecalciferol (VITAMIN D-1000 MAX ST) 1000 units tablet Take 1,000 Units by mouth daily.     Marland Kitchen dicyclomine (BENTYL) 20 MG tablet Take 1 tablet (20 mg total) by mouth 3 (three) times daily before meals. 90 tablet 3  . ferrous sulfate 325 (65 FE) MG tablet Take 325 mg by mouth daily with breakfast.    . fluticasone (FLONASE) 50 MCG/ACT nasal spray Place 1 spray into both nostrils 2 (two) times  daily. 16 g 6  . sertraline (ZOLOFT) 100 MG tablet TAKE 1 TABLET BY MOUTH EVERY DAY (Patient taking differently: Take 100 mg by mouth daily. ) 90 tablet 1  . vitamin B-12 (CYANOCOBALAMIN) 500 MCG tablet Take 500 mcg by mouth daily.    Marland Kitchen zinc gluconate 50 MG tablet Take by mouth.     No current facility-administered medications for this visit.   General: negative for fevers, changes in weight. Positive for night sweats Skin: negative for changes in moles or sores or rash Eyes: negative for changes in vision HEENT: negative for change in hearing, tinnitus, voice changes Pulmonary: negative for dyspnea, orthopnea, productive cough, wheezing Cardiac: negative for palpitations, pain Gastrointestinal: negative for nausea, vomiting, constipation, diarrhea, hematemesis, hematochezia Genitourinary/Sexual: positive for incontinence long standing happens almost daily; negative for dysuria, retention, hematuria Ob/Gyn:  negative for pain. Positive for abnormal bleeding per hpi Musculoskeletal: negative for pain, joint pain. Positive for back pain. Hematology: negative for easy bruising, abnormal bleeding Neurologic/Psych: negative for headaches, seizures, paralysis, weakness, numbness   Objective:  Physical Examination:  Body mass index is 41.77 kg/m. BP (!) 129/92 (BP Location: Left Arm, Patient Position: Sitting)   Pulse 93   Temp 98.6 F (37 C) (Tympanic)   Resp 18   Ht 5' (1.524 m)   Wt 213 lb 14.4 oz (97 kg)   LMP 01/03/2020 (Exact Date)    BMI 41.77 kg/m     ECOG Performance Status: 1 - Symptomatic but completely ambulatory  GENERAL: Patient is a well appearing female in no acute distress HEENT:  Sclera clear. Anicteric NODES:  Negative axillary, supraclavicular, inguinal lymph node survery LUNGS:  Clear to auscultation bilaterally.   HEART:  Regular rate and rhythm.  ABDOMEN:  Soft, nontender.  No hernias. No masses or ascites EXTREMITIES:  No peripheral edema. Atraumatic. No cyanosis SKIN:  Clear with no obvious rashes or skin changes. Hirsutism NEURO:  Nonfocal. Well oriented.  Appropriate affect.  Pelvic: EGBUS: no lesions Cervix: erythematous lesion extending 3-9 o'clock vs ectropian. nontender, mobile, firm to palpation. Vagina: no lesions, no discharge or bleeding Uterus: normal size, nontender, mobile Adnexa: no palpable masses Rectovaginal: deferred  The risks and benefits of the procedure were reviewed and informed consent obtained. Time out was performed. The patient received pre-procedure teaching and expressed understanding. The post-procedure instructions were reviewed with the patient and she expressed understanding. The patient does not have any barriers to learning.  The area cleansed with betadine. A biopsy forceps was used to obtained a biopsy.  Hemostasis was obtained with silver nitrate. She tolerated the procedure well.   Lab Review Labs on site today: Lab Results  Component Value Date   WBC 6.6 01/18/2020   HGB 14.2 01/18/2020   HCT 43.2 01/18/2020   MCV 93 01/18/2020   PLT 233 01/18/2020   Lab Results  Component Value Date   NA 137 06/14/2019   K 4.6 06/14/2019   CL 104 06/14/2019   CO2 25 06/14/2019    WET PREP  Ref Range & Units 16:21  Yeast Wet Prep HPF POC NONE SEEN NONE SEEN   Trich, Wet Prep NONE SEEN NONE SEEN   Clue Cells Wet Prep HPF POC NONE SEEN NONE SEEN   WBC, Wet Prep HPF POC NONE SEEN FEWAbnormal   Sperm  NONE SEEN      Radiologic Imaging: [place] ordered by Dr.  Kenton Kingfisher.     Assessment:  KAITLYNE FRIEDHOFF is a 54 y.o. female diagnosed with  grade 1 endometrioid endometrial cancer.   Urinary incontinence, suspect stress incontinence.   O negative blood type  Medical co-morbidities complicating care: prior surgery. Body mass index is 41.77 kg/m.  Plan:   Problem List Items Addressed This Visit      Genitourinary   Endometrial cancer [facility])     Other   Cervical lesion - Primary   Relevant Orders   Surgical pathology   Ambulatory referral to Urology   Urinary incontinence   Relevant Orders   Ambulatory referral to Urology   Vaginal discharge   Relevant Orders   Wet prep, genital (Completed)     We discussed options for management including surgery, radiation and hormonal therapy. She is a surgical candidate. We recommended surgery with robotic assisted TLH BSO with sentinel lymph node mapping, injection, and biopsy; possible pelvic and PA node dissection.   Risks were discussed in detail. These include infection, anesthesia, bleeding, transfusion, wound separation, vaginal cuff dehiscence, medical issues (blood clots, stroke, heart attack, fluid in the lungs, pneumonia, abnormal heart rhythm, death), possible exploratory surgery with larger incision, lymphedema, lymphocyst, allergic reaction, injury to adjacent organs (bowel, bladder, blood vessels, nerves, ureters). She will receive DVT prophylaxis.   Refer to Urology for Urinary incontinence, evaluate for stress incontinence. We may be able to do a joint procedure.   Suggested return to clinic in  4-6 weeks postoperatively.        Gyn VTE Prophylaxis Algorithm  Risk factors for VTE (calculator):  Point value Risk factors  1 point each age 81-60  2 points each BMI >30  3 points each personal/family history of VTE  5 points each  none in this category   Major surgery (>30 min); known malignancy or concern for malignancy (elevated tumor markers); minimally invasive surgery.  Risk  assessment: 5+ points; Very high risk - heparin/UFH and SCDs and extended prophylaxis for 28 days based on the algorithm. Her VTE family history in her daughter was a provoked event associated with leg fracture and immobility. Discussed with Dr. Erasmo Leventhal. In this setting we do not need extended prophylaxis. I will order SCD and preop heparin.   Check labs including HbAIc  [name] diagnosis, an outline of the further diagnostic and laboratory studies which will be required, the recommendation for surgery, and alternatives were discussed with her and her accompanying family members.  All questions were answered to their satisfaction.  A total of at least 80 minutes were spent with the patient/family today; >50% was spent in education, counseling and coordination of care for endometrial cancer, cervical lesion, and urinary incontinence.    I personally had a face to face interaction and evaluated the patient jointly with the NP, Ms. Beckey Rutter.  I have reviewed her history and available records and have performed the key portions of the physical exam including  lymph node survey, abdominal exam, pelvic exam and biopsy with my findings confirming those documented above by the APP.  I have discussed the case with the APP and the patient.  I agree with the above documentation, assessment and plan which was fully formulated by me.  Counseling was completed by me.   I personally saw the patient and performed a substantive portion of this encounter in conjunction with the listed APP as documented above.    Miski Feldpausch Gaetana Michaelis, MD

## 2020-01-30 NOTE — H&P (Signed)
Gynecologic Oncology History and Physical Referring Provider: Ardeth Perfect, PA  Chief Complaint: Endometrioid Carcinoma  Subjective:  Susan Strickland is a 54 y.o. female who is seen in consultation from [facility], [place] for newly diagnosed endometrioid carcinoma.   She initially presented for AUB s/p endometrial biopsy on 01/18/20.   A. ENDOMETRIUM, BIOPSY:  - Focal endometrioid carcinoma, FIGO grade 1, arising in complex atypical hyperplasia.   Per patient, she has not had a year without menstrual bleeding. Reports saturating 3-5 pads per day with large clots for 2 weeks/month. Has associated fatigue, history of iron deficiency anemia and is on oral iron. Also reports night sweats and chronic back pain.   She presents for definitive management   Problem List: Patient Active Problem List   Diagnosis Date Noted  . Endometrial cancer ([place]) 01/30/2020  . Cervical lesion 01/30/2020  . Urinary incontinence 01/30/2020  . Vaginal discharge 01/30/2020  . Abnormal uterine bleeding (AUB) 01/18/2020  . Breast mass, right 12/11/2019  . Abdominal pain   . BMI 40.0-44.9, adult ([place]) 02/19/2019  . Encounter for screening mammogram for breast cancer   . Intractable vomiting   . Acute gastritis without hemorrhage   . S/P bariatric surgery 04/25/2017  . Vitamin B12 nutritional deficiency 04/25/2017  . Obesity (BMI 30-39.9) 04/25/2017  . Normocytic anemia 04/25/2017  . Hyperlipidemia 04/25/2017  . Abnormal mammogram 02/01/2017  . PMDD (premenstrual dysphoric disorder) 10/21/2015  . Vitamin D deficiency 10/21/2015  . Degeneration of intervertebral disc of lumbar region 12/23/2014  . Calculus of kidney 04/29/2014  . L-S radiculopathy 11/04/2011    Past Medical History: Past Medical History:  Diagnosis Date  . Anxiety   . Breast cyst   . Degenerative disc disease, lumbar   . Depression   . History of mammogram 05/2012; 09/24/2014   wnl per pt; benign  . History of Papanicolaou smear  of cervix 08/15/2012   -/-  . Hyperlipemia   . Vitamin D deficiency     Past Surgical History: Past Surgical History:  Procedure Laterality Date  . BACK SURGERY     L5  . BREAST BIOPSY Left 2016   NEG  . COLONOSCOPY WITH PROPOFOL N/A 04/27/2018   Procedure: COLONOSCOPY WITH BIOPSIES;  Surgeon: Lucilla Lame, MD;  Location: [place];  Service: Endoscopy;  Laterality: N/A;  . ESOPHAGOGASTRODUODENOSCOPY (EGD) WITH PROPOFOL N/A 04/27/2018   Procedure: ESOPHAGOGASTRODUODENOSCOPY (EGD) WITH BIOPSIES;  Surgeon: Lucilla Lame, MD;  Location: [place];  Service: Endoscopy;  Laterality: N/A;  . ESOPHAGOGASTRODUODENOSCOPY (EGD) WITH PROPOFOL N/A 06/29/2019   Procedure: ESOPHAGOGASTRODUODENOSCOPY (EGD) WITH PROPOFOL;  Surgeon: Lucilla Lame, MD;  Location: [facility];  Service: Endoscopy;  Laterality: N/A;  . 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   Marland Kitchen POLYPECTOMY N/A 04/27/2018   Procedure: POLYPECTOMY;  Surgeon: Lucilla Lame, MD;  Location: [place];  Service: Endoscopy;  Laterality: N/A;    Past Gynecologic History:  Menarche: age 60 Pain with menses Last Menstrual Period:   OB History:  OB History  Gravida Para Term Preterm AB Living  4 3 3   1 3   SAB TAB Ectopic Multiple Live Births  1       3    # Outcome Date GA Lbr Len/2nd Weight Sex Delivery Anes PTL Lv  4 Term 07/07/92          3 Term 04/21/89          2 SAB 07/13/87  1 Term 11/20/85            Obstetric Comments  1st Menstrual Cycle:  14  1st Pregnancy:  19    Family History: h/o VTE in her daughter associated with leg fracture and immobility.  Family History  Problem Relation Age of Onset  . Breast cancer Mother 87  . Hypertension Father   . Kidney disease Father   . Diabetes Father        type 2  . Heart disease Father     Social History: Social History   Socioeconomic History  . Marital status: Married    Spouse name: Not on file  . Number of children: Not  on file  . Years of education: 22  . Highest education level: Not on file  Occupational History  . Occupation: Pharmacist, hospital  Tobacco Use  . Smoking status: Never Smoker  . Smokeless tobacco: Never Used  Vaping Use  . Vaping Use: Never used  Substance and Sexual Activity  . Alcohol use: No  . Drug use: No  . Sexual activity: Not Currently    Birth control/protection: Surgical, Other-see comments    Comment: vasectomy  Other Topics Concern  . Not on file  Social History Narrative  . Not on file   Social Determinants of Health   Financial Resource Strain:   . Difficulty of Paying Living Expenses:   Food Insecurity:   . Worried About Charity fundraiser in the Last Year:   . Arboriculturist in the Last Year:   Transportation Needs:   . Film/video editor (Medical):   Marland Kitchen Lack of Transportation (Non-Medical):   Physical Activity:   . Days of Exercise per Week:   . Minutes of Exercise per Session:   Stress:   . Feeling of Stress :   Social Connections:   . Frequency of Communication with Friends and Family:   . Frequency of Social Gatherings with Friends and Family:   . Attends Religious Services:   . Active Member of Clubs or Organizations:   . Attends Archivist Meetings:   Marland Kitchen Marital Status:   Intimate Partner Violence:   . Fear of Current or Ex-Partner:   . Emotionally Abused:   Marland Kitchen Physically Abused:   . Sexually Abused:     Allergies: Allergies  Allergen Reactions  . Lac Bovis Nausea And Vomiting       . Doxycycline Nausea Only    Hot flashes, vivid dreams  . Milk-Related Compounds Nausea And Vomiting       . Sweetness Enhancer     headaches  . Azithromycin Rash    Current Medications: Current Outpatient Medications  Medication Sig Dispense Refill  . acetaminophen (TYLENOL) 325 MG tablet Take 650 mg by mouth every 6 (six) hours as needed for moderate pain.    . Ascorbic Acid (VITAMIN C) 1000 MG tablet Take 1,000 mg by mouth daily.     Marland Kitchen b  complex vitamins tablet Take 1 tablet by mouth daily.    . Cholecalciferol (VITAMIN D-1000 MAX ST) 1000 units tablet Take 1,000 Units by mouth daily.     Marland Kitchen dicyclomine (BENTYL) 20 MG tablet Take 1 tablet (20 mg total) by mouth 3 (three) times daily before meals. 90 tablet 3  . ferrous sulfate 325 (65 FE) MG tablet Take 325 mg by mouth daily with breakfast.    . fluticasone (FLONASE) 50 MCG/ACT nasal spray Place 1 spray into both nostrils 2 (two) times  daily. 16 g 6  . sertraline (ZOLOFT) 100 MG tablet TAKE 1 TABLET BY MOUTH EVERY DAY (Patient taking differently: Take 100 mg by mouth daily. ) 90 tablet 1  . vitamin B-12 (CYANOCOBALAMIN) 500 MCG tablet Take 500 mcg by mouth daily.    Marland Kitchen zinc gluconate 50 MG tablet Take by mouth.     No current facility-administered medications for this visit.   General: negative for fevers, changes in weight. Positive for night sweats Skin: negative for changes in moles or sores or rash Eyes: negative for changes in vision HEENT: negative for change in hearing, tinnitus, voice changes Pulmonary: negative for dyspnea, orthopnea, productive cough, wheezing Cardiac: negative for palpitations, pain Gastrointestinal: negative for nausea, vomiting, constipation, diarrhea, hematemesis, hematochezia Genitourinary/Sexual: positive for incontinence long standing happens almost daily; negative for dysuria, retention, hematuria Ob/Gyn:  negative for pain. Positive for abnormal bleeding per hpi Musculoskeletal: negative for pain, joint pain. Positive for back pain. Hematology: negative for easy bruising, abnormal bleeding Neurologic/Psych: negative for headaches, seizures, paralysis, weakness, numbness   Objective:  Physical Examination:  Body mass index is 41.77 kg/m. BP (!) 129/92 (BP Location: Left Arm, Patient Position: Sitting)   Pulse 93   Temp 98.6 F (37 C) (Tympanic)   Resp 18   Ht 5' (1.524 m)   Wt 213 lb 14.4 oz (97 kg)   LMP 01/03/2020 (Exact Date)    BMI 41.77 kg/m     ECOG Performance Status: 1 - Symptomatic but completely ambulatory  GENERAL: Patient is a well appearing female in no acute distress HEENT:  Sclera clear. Anicteric NODES:  Negative axillary, supraclavicular, inguinal lymph node survery LUNGS:  Clear to auscultation bilaterally.   HEART:  Regular rate and rhythm.  ABDOMEN:  Soft, nontender.  No hernias. No masses or ascites EXTREMITIES:  No peripheral edema. Atraumatic. No cyanosis SKIN:  Clear with no obvious rashes or skin changes. Hirsutism NEURO:  Nonfocal. Well oriented.  Appropriate affect.  Pelvic: EGBUS: no lesions Cervix: erythematous lesion extending 3-9 o'clock vs ectropian. nontender, mobile, firm to palpation. Vagina: no lesions, no discharge or bleeding Uterus: normal size, nontender, mobile Adnexa: no palpable masses Rectovaginal: deferred  The risks and benefits of the procedure were reviewed and informed consent obtained. Time out was performed. The patient received pre-procedure teaching and expressed understanding. The post-procedure instructions were reviewed with the patient and she expressed understanding. The patient does not have any barriers to learning.  The area cleansed with betadine. A biopsy forceps was used to obtained a biopsy.  Hemostasis was obtained with silver nitrate. She tolerated the procedure well.   Lab Review Labs on site today: Lab Results  Component Value Date   WBC 6.6 01/18/2020   HGB 14.2 01/18/2020   HCT 43.2 01/18/2020   MCV 93 01/18/2020   PLT 233 01/18/2020   Lab Results  Component Value Date   NA 137 06/14/2019   K 4.6 06/14/2019   CL 104 06/14/2019   CO2 25 06/14/2019    WET PREP  Ref Range & Units 16:21  Yeast Wet Prep HPF POC NONE SEEN NONE SEEN   Trich, Wet Prep NONE SEEN NONE SEEN   Clue Cells Wet Prep HPF POC NONE SEEN NONE SEEN   WBC, Wet Prep HPF POC NONE SEEN FEWAbnormal   Sperm  NONE SEEN      Radiologic Imaging: [place] ordered by Dr.  Kenton Kingfisher.     Assessment:  Susan Strickland is a 54 y.o. female diagnosed with  grade 1 endometrioid endometrial cancer.   Urinary incontinence, suspect stress incontinence.   O negative blood type  Medical co-morbidities complicating care: prior surgery. Body mass index is 41.77 kg/m.  Plan:   Problem List Items Addressed This Visit      Genitourinary   Endometrial cancer [facility])     Other   Cervical lesion - Primary   Relevant Orders   Surgical pathology   Ambulatory referral to Urology   Urinary incontinence   Relevant Orders   Ambulatory referral to Urology   Vaginal discharge   Relevant Orders   Wet prep, genital (Completed)     We discussed options for management including surgery, radiation and hormonal therapy. She is a surgical candidate. We recommended surgery with robotic assisted TLH BSO with sentinel lymph node mapping, injection, and biopsy; possible pelvic and PA node dissection.   Risks were discussed in detail. These include infection, anesthesia, bleeding, transfusion, wound separation, vaginal cuff dehiscence, medical issues (blood clots, stroke, heart attack, fluid in the lungs, pneumonia, abnormal heart rhythm, death), possible exploratory surgery with larger incision, lymphedema, lymphocyst, allergic reaction, injury to adjacent organs (bowel, bladder, blood vessels, nerves, ureters). She will receive DVT prophylaxis.   Refer to Urology for Urinary incontinence, evaluate for stress incontinence. We may be able to do a joint procedure.   Suggested return to clinic in  4-6 weeks postoperatively.        Gyn VTE Prophylaxis Algorithm  Risk factors for VTE (calculator):  Point value Risk factors  1 point each age 64-60  2 points each BMI >30  3 points each personal/family history of VTE  5 points each  none in this category   Major surgery (>30 min); known malignancy or concern for malignancy (elevated tumor markers); minimally invasive surgery.  Risk  assessment: 5+ points; Very high risk - heparin/UFH and SCDs and extended prophylaxis for 28 days based on the algorithm. Her VTE family history in her daughter was a provoked event associated with leg fracture and immobility. Discussed with Dr. Erasmo Leventhal. In this setting we do not need extended prophylaxis. I will order SCD and preop heparin.   Check labs including HbAIc  [name] diagnosis, an outline of the further diagnostic and laboratory studies which will be required, the recommendation for surgery, and alternatives were discussed with her and her accompanying family members.  All questions were answered to their satisfaction.  A total of at least 80 minutes were spent with the patient/family today; >50% was spent in education, counseling and coordination of care for endometrial cancer, cervical lesion, and urinary incontinence.    I personally had a face to face interaction and evaluated the patient jointly with the NP, Ms. Beckey Rutter.  I have reviewed her history and available records and have performed the key portions of the physical exam including  lymph node survey, abdominal exam, pelvic exam and biopsy with my findings confirming those documented above by the APP.  I have discussed the case with the APP and the patient.  I agree with the above documentation, assessment and plan which was fully formulated by me.  Counseling was completed by me.   I personally saw the patient and performed a substantive portion of this encounter in conjunction with the listed APP as documented above.    Tyreese Thain Gaetana Michaelis, MD

## 2020-01-30 NOTE — Progress Notes (Signed)
New patient referred by Copland NP @ [facility]. For endometrial adenocarcinoma

## 2020-01-30 NOTE — Progress Notes (Signed)
Gynecologic Oncology Consult Visit   Referring Provider: Ardeth Perfect, PA  Chief Complaint: Endometrioid Carcinoma  Subjective:  Susan Strickland is a 54 y.o. female who is seen in consultation from [facility], [place] for newly diagnosed endometrioid carcinoma.   She initially presented for AUB s/p endometrial biopsy on 01/18/20.   A. ENDOMETRIUM, BIOPSY:  - Focal endometrioid carcinoma, FIGO grade 1, arising in complex atypical hyperplasia.   Per patient, she has not had a year without menstrual bleeding. Reports saturating 3-5 pads per day with large clots for 2 weeks/month. Has associated fatigue, history of iron deficiency anemia and is on oral iron. Also reports night sweats and chronic back pain.   She presents for definitive management   Problem List: Patient Active Problem List   Diagnosis Date Noted  . Endometrial cancer ([place]) 01/30/2020  . Cervical lesion 01/30/2020  . Urinary incontinence 01/30/2020  . Vaginal discharge 01/30/2020  . Abnormal uterine bleeding (AUB) 01/18/2020  . Breast mass, right 12/11/2019  . Abdominal pain   . BMI 40.0-44.9, adult ([place]) 02/19/2019  . Encounter for screening mammogram for breast cancer   . Intractable vomiting   . Acute gastritis without hemorrhage   . S/P bariatric surgery 04/25/2017  . Vitamin B12 nutritional deficiency 04/25/2017  . Obesity (BMI 30-39.9) 04/25/2017  . Normocytic anemia 04/25/2017  . Hyperlipidemia 04/25/2017  . Abnormal mammogram 02/01/2017  . PMDD (premenstrual dysphoric disorder) 10/21/2015  . Vitamin D deficiency 10/21/2015  . Degeneration of intervertebral disc of lumbar region 12/23/2014  . Calculus of kidney 04/29/2014  . L-S radiculopathy 11/04/2011    Past Medical History: Past Medical History:  Diagnosis Date  . Anxiety   . Breast cyst   . Degenerative disc disease, lumbar   . Depression   . History of mammogram 05/2012; 09/24/2014   wnl per pt; benign  . History of Papanicolaou smear of  cervix 08/15/2012   -/-  . Hyperlipemia   . Vitamin D deficiency     Past Surgical History: Past Surgical History:  Procedure Laterality Date  . BACK SURGERY     L5  . BREAST BIOPSY Left 2016   NEG  . COLONOSCOPY WITH PROPOFOL N/A 04/27/2018   Procedure: COLONOSCOPY WITH BIOPSIES;  Surgeon: Lucilla Lame, MD;  Location: [facility];  Service: Endoscopy;  Laterality: N/A;  . ESOPHAGOGASTRODUODENOSCOPY (EGD) WITH PROPOFOL N/A 04/27/2018   Procedure: ESOPHAGOGASTRODUODENOSCOPY (EGD) WITH BIOPSIES;  Surgeon: Lucilla Lame, MD;  Location: [place];  Service: Endoscopy;  Laterality: N/A;  . ESOPHAGOGASTRODUODENOSCOPY (EGD) WITH PROPOFOL N/A 06/29/2019   Procedure: ESOPHAGOGASTRODUODENOSCOPY (EGD) WITH PROPOFOL;  Surgeon: Lucilla Lame, MD;  Location: [facility];  Service: Endoscopy;  Laterality: N/A;  . 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   Marland Kitchen POLYPECTOMY N/A 04/27/2018   Procedure: POLYPECTOMY;  Surgeon: Lucilla Lame, MD;  Location: [facility] [place];  Service: Endoscopy;  Laterality: N/A;    Past Gynecologic History:  Menarche: age 41 Pain with menses Last Menstrual Period:   OB History:  OB History  Gravida Para Term Preterm AB Living  4 3 3   1 3   SAB TAB Ectopic Multiple Live Births  1       3    # Outcome Date GA Lbr Len/2nd Weight Sex Delivery Anes PTL Lv  4 Term 07/07/92          3 Term 04/21/89          2 SAB 07/13/87  1 Term 11/20/85            Obstetric Comments  1st Menstrual Cycle:  14  1st Pregnancy:  19    Family History: h/o VTE in her daughter associated with leg fracture and immobility.  Family History  Problem Relation Age of Onset  . Breast cancer Mother 73  . Hypertension Father   . Kidney disease Father   . Diabetes Father        type 2  . Heart disease Father     Social History: Social History   Socioeconomic History  . Marital status: Married    Spouse name: Not on file  . Number of children: Not on  file  . Years of education: 85  . Highest education level: Not on file  Occupational History  . Occupation: Pharmacist, hospital  Tobacco Use  . Smoking status: Never Smoker  . Smokeless tobacco: Never Used  Vaping Use  . Vaping Use: Never used  Substance and Sexual Activity  . Alcohol use: No  . Drug use: No  . Sexual activity: Not Currently    Birth control/protection: Surgical, Other-see comments    Comment: vasectomy  Other Topics Concern  . Not on file  Social History Narrative  . Not on file   Social Determinants of Health   Financial Resource Strain:   . Difficulty of Paying Living Expenses:   Food Insecurity:   . Worried About Charity fundraiser in the Last Year:   . Arboriculturist in the Last Year:   Transportation Needs:   . Film/video editor (Medical):   Marland Kitchen Lack of Transportation (Non-Medical):   Physical Activity:   . Days of Exercise per Week:   . Minutes of Exercise per Session:   Stress:   . Feeling of Stress :   Social Connections:   . Frequency of Communication with Friends and Family:   . Frequency of Social Gatherings with Friends and Family:   . Attends Religious Services:   . Active Member of Clubs or Organizations:   . Attends Archivist Meetings:   Marland Kitchen Marital Status:   Intimate Partner Violence:   . Fear of Current or Ex-Partner:   . Emotionally Abused:   Marland Kitchen Physically Abused:   . Sexually Abused:     Allergies: Allergies  Allergen Reactions  . Lac Bovis Nausea And Vomiting       . Doxycycline Nausea Only    Hot flashes, vivid dreams  . Milk-Related Compounds Nausea And Vomiting       . Sweetness Enhancer     headaches  . Azithromycin Rash    Current Medications: Current Outpatient Medications  Medication Sig Dispense Refill  . acetaminophen (TYLENOL) 325 MG tablet Take 650 mg by mouth every 6 (six) hours as needed for moderate pain.    . Ascorbic Acid (VITAMIN C) 1000 MG tablet Take 1,000 mg by mouth daily.     Marland Kitchen b complex  vitamins tablet Take 1 tablet by mouth daily.    . Cholecalciferol (VITAMIN D-1000 MAX ST) 1000 units tablet Take 1,000 Units by mouth daily.     Marland Kitchen dicyclomine (BENTYL) 20 MG tablet Take 1 tablet (20 mg total) by mouth 3 (three) times daily before meals. 90 tablet 3  . ferrous sulfate 325 (65 FE) MG tablet Take 325 mg by mouth daily with breakfast.    . fluticasone (FLONASE) 50 MCG/ACT nasal spray Place 1 spray into both nostrils 2 (two) times  daily. 16 g 6  . sertraline (ZOLOFT) 100 MG tablet TAKE 1 TABLET BY MOUTH EVERY DAY (Patient taking differently: Take 100 mg by mouth daily. ) 90 tablet 1  . vitamin B-12 (CYANOCOBALAMIN) 500 MCG tablet Take 500 mcg by mouth daily.    Marland Kitchen zinc gluconate 50 MG tablet Take by mouth.     No current facility-administered medications for this visit.   General: negative for fevers, changes in weight. Positive for night sweats Skin: negative for changes in moles or sores or rash Eyes: negative for changes in vision HEENT: negative for change in hearing, tinnitus, voice changes Pulmonary: negative for dyspnea, orthopnea, productive cough, wheezing Cardiac: negative for palpitations, pain Gastrointestinal: negative for nausea, vomiting, constipation, diarrhea, hematemesis, hematochezia Genitourinary/Sexual: positive for incontinence long standing happens almost daily; negative for dysuria, retention, hematuria Ob/Gyn:  negative for pain. Positive for abnormal bleeding per hpi Musculoskeletal: negative for pain, joint pain. Positive for back pain. Hematology: negative for easy bruising, abnormal bleeding Neurologic/Psych: negative for headaches, seizures, paralysis, weakness, numbness   Objective:  Physical Examination:  Body mass index is 41.77 kg/m. BP (!) 129/92 (BP Location: Left Arm, Patient Position: Sitting)   Pulse 93   Temp 98.6 F (37 C) (Tympanic)   Resp 18   Ht 5' (1.524 m)   Wt 213 lb 14.4 oz (97 kg)   LMP 01/03/2020 (Exact Date)   BMI  41.77 kg/m     ECOG Performance Status: 1 - Symptomatic but completely ambulatory  GENERAL: Patient is a well appearing female in no acute distress HEENT:  Sclera clear. Anicteric NODES:  Negative axillary, supraclavicular, inguinal lymph node survery LUNGS:  Clear to auscultation bilaterally.   HEART:  Regular rate and rhythm.  ABDOMEN:  Soft, nontender.  No hernias. No masses or ascites EXTREMITIES:  No peripheral edema. Atraumatic. No cyanosis SKIN:  Clear with no obvious rashes or skin changes. Hirsutism NEURO:  Nonfocal. Well oriented.  Appropriate affect.  Pelvic: EGBUS: no lesions Cervix: erythematous lesion extending 3-9 o'clock vs ectropian. nontender, mobile, firm to palpation. Vagina: no lesions, no discharge or bleeding Uterus: normal size, nontender, mobile Adnexa: no palpable masses Rectovaginal: deferred  The risks and benefits of the procedure were reviewed and informed consent obtained. Time out was performed. The patient received pre-procedure teaching and expressed understanding. The post-procedure instructions were reviewed with the patient and she expressed understanding. The patient does not have any barriers to learning.  The area cleansed with betadine. A biopsy forceps was used to obtained a biopsy.  Hemostasis was obtained with silver nitrate. She tolerated the procedure well.   Lab Review Labs on site today: Lab Results  Component Value Date   WBC 6.6 01/18/2020   HGB 14.2 01/18/2020   HCT 43.2 01/18/2020   MCV 93 01/18/2020   PLT 233 01/18/2020   Lab Results  Component Value Date   NA 137 06/14/2019   K 4.6 06/14/2019   CL 104 06/14/2019   CO2 25 06/14/2019    WET PREP  Ref Range & Units 16:21  Yeast Wet Prep HPF POC NONE SEEN NONE SEEN   Trich, Wet Prep NONE SEEN NONE SEEN   Clue Cells Wet Prep HPF POC NONE SEEN NONE SEEN   WBC, Wet Prep HPF POC NONE SEEN FEWAbnormal   Sperm  NONE SEEN      Radiologic Imaging: [place] ordered by Dr.  Kenton Kingfisher.     Assessment:  JASMEET MANTON is a 54 y.o. female diagnosed with  grade 1 endometrioid endometrial cancer.   Urinary incontinence, suspect stress incontinence.   O negative blood type  Medical co-morbidities complicating care: prior surgery. Body mass index is 41.77 kg/m.  Plan:   Problem List Items Addressed This Visit      Genitourinary   Endometrial cancer [facility])     Other   Cervical lesion - Primary   Relevant Orders   Surgical pathology   Ambulatory referral to Urology   Urinary incontinence   Relevant Orders   Ambulatory referral to Urology   Vaginal discharge   Relevant Orders   Wet prep, genital (Completed)     We discussed options for management including surgery, radiation and hormonal therapy. She is a surgical candidate. We recommended surgery with robotic assisted TLH BSO with sentinel lymph node mapping, injection, and biopsy; possible pelvic and PA node dissection.   Risks were discussed in detail. These include infection, anesthesia, bleeding, transfusion, wound separation, vaginal cuff dehiscence, medical issues (blood clots, stroke, heart attack, fluid in the lungs, pneumonia, abnormal heart rhythm, death), possible exploratory surgery with larger incision, lymphedema, lymphocyst, allergic reaction, injury to adjacent organs (bowel, bladder, blood vessels, nerves, ureters). She will receive DVT prophylaxis.   Refer to Urology for Urinary incontinence, evaluate for stress incontinence. We may be able to do a joint procedure.   Suggested return to clinic in  4-6 weeks postoperatively.        Gyn VTE Prophylaxis Algorithm  Risk factors for VTE (calculator):  Point value Risk factors  1 point each age 54-60  2 points each BMI >30  3 points each personal/family history of VTE  5 points each  none in this category   Major surgery (>30 min); known malignancy or concern for malignancy (elevated tumor markers); minimally invasive surgery.  Risk  assessment: 5+ points; Very high risk - heparin/UFH and SCDs and extended prophylaxis for 28 days based on the algorithm. Her VTE family history in her daughter was a provoked event associated with leg fracture and immobility. Discussed with Dr. Erasmo Leventhal. In this setting we do not need extended prophylaxis. I will order SCD and preop heparin.   [name] diagnosis, an outline of the further diagnostic and laboratory studies which will be required, the recommendation for surgery, and alternatives were discussed with her and her accompanying family members.  All questions were answered to their satisfaction.  A total of at least 80 minutes were spent with the patient/family today; >50% was spent in education, counseling and coordination of care for endometrial cancer, cervical lesion, and urinary incontinence.    I personally had a face to face interaction and evaluated the patient jointly with the NP, Ms. Beckey Rutter.  I have reviewed her history and available records and have performed the key portions of the physical exam including  lymph node survey, abdominal exam, pelvic exam and biopsy with my findings confirming those documented above by the APP.  I have discussed the case with the APP and the patient.  I agree with the above documentation, assessment and plan which was fully formulated by me.  Counseling was completed by me.   I personally saw the patient and performed a substantive portion of this encounter in conjunction with the listed APP as documented above.    Susan Srinivasan Gaetana Michaelis, MD

## 2020-01-31 ENCOUNTER — Encounter: Payer: Self-pay | Admitting: Obstetrics and Gynecology

## 2020-02-01 LAB — SURGICAL PATHOLOGY

## 2020-02-04 ENCOUNTER — Telehealth: Payer: Self-pay

## 2020-02-04 NOTE — Telephone Encounter (Signed)
Contacted the blood bank. They do keep O negative blood on site.

## 2020-02-05 ENCOUNTER — Telehealth: Payer: Self-pay | Admitting: Nurse Practitioner

## 2020-02-05 MED ORDER — CYCLOBENZAPRINE HCL 5 MG PO TABS: 5.0000 mg | ORAL_TABLET | Freq: Three times a day (TID) | ORAL | 0 refills | Status: AC | PRN

## 2020-02-05 NOTE — Telephone Encounter (Signed)
Called and discussed pathology results:   DIAGNOSIS:  A. CERVIX; BIOPSY:  - TINY FRAGMENTS OF SQUAMOUS EPITHELIUM WITH ACUTE INFLAMMATION AND  REACTIVE CHANGES.  - MINUTE STRIPS OF BENIGN ENDOCERVICAL EPITHELIUM.  - DEEPER SECTIONS WERE EXAMINED.  - NEGATIVE FOR MALIGNANCY.   Comment:  Per [facility] the patient has a recent diagnosis of endometrioid carcinoma  FIGO grade 1 arising in complex atypical hyperplasia.   No changes to plan for surgery. Patient experiencing back pain and muscle tightness with her upcoming cycle unrelieved by tylenol. No nsaids d/t hx of gastric sleeve. Prescription for flexeril sent to pharmacy, 5mg  tid prn.

## 2020-02-05 NOTE — Telephone Encounter (Signed)
Hi Lauren, can you provide Ms. Franta some advise regarding her symptoms. Thanks.

## 2020-02-06 ENCOUNTER — Telehealth: Payer: Self-pay | Admitting: Obstetrics and Gynecology

## 2020-02-07 NOTE — Telephone Encounter (Signed)
Called pt to adv of pre-op appt with [facility] on 8/9 at 8:00. I adv that she needs to have a full bladder for this appt.  Gilman Schmidt will be assisting Susan Strickland on her DOS 8/11  Adv that she can go to Covid testing after appt.

## 2020-02-12 NOTE — Progress Notes (Signed)
02/13/2020 2:38 PM   Susan Strickland 14-Mar-1966 [id number]  Referring provider: Leonel Ramsay, MD [place],  [place] [address] Chief Complaint  Patient presents with  . Urinary Incontinence    HPI: Susan Strickland is a 54 y.o. female who presents today for evaluation and management of urinary incontinence.   Patient has a recent diagnosis of endometrioid carcinoma. Pathology on 01/18/2020 showed focal endometrioid carcinoma, FIGO grade 1, arising in complex atypical hyperplasia. Patient has an upcoming hysterectomy with Dr. Theora Gianotti on 02/20/2020.   Today, she presents today for further evaluation of urinary incontinence.  She had 3 vaginal births, her last child was 10 lbs. She did not notice any stress incontinence after giving birth. She has noted ongoing urinary stress incontinence x 3 years.  This is with movement, laughing coughing sneezing, etc.  PVR is 5 mL today.   She tends to leak while standing still.  She has urgency and frequency. She has nocturia x 3 nightly. She wears pads and she tends to soak them. She reports having 4-5 episodes of urinating herself.  She does engage in toilet mapping toilet mapping. She trying any any urinary incontinence medication.   Reports having occasional episodes of vaginal bleeding. Today she notes vaginal bleeding.  No constipation. She drinks mostly water. She drinks 1 cup of decaf coffee daily. She reports that she recently stopped dieting but would love to start again.   PMH: Past Medical History:  Diagnosis Date  . Anemia   . Anxiety   . Breast cyst   . Degenerative disc disease, lumbar   . Depression   . History of mammogram 05/2012; 09/24/2014   wnl per pt; benign  . History of Papanicolaou smear of cervix 08/15/2012   -/-  . Hyperlipemia   . Vitamin D deficiency     Surgical History: Past Surgical History:  Procedure Laterality Date  . BACK SURGERY  2015   L5  . BREAST BIOPSY Left 2016    NEG  . COLONOSCOPY WITH PROPOFOL N/A 04/27/2018   Procedure: COLONOSCOPY WITH BIOPSIES;  Surgeon: Lucilla Lame, MD;  Location: [facility];  Service: Endoscopy;  Laterality: N/A;  . ESOPHAGOGASTRODUODENOSCOPY (EGD) WITH PROPOFOL N/A 04/27/2018   Procedure: ESOPHAGOGASTRODUODENOSCOPY (EGD) WITH BIOPSIES;  Surgeon: Lucilla Lame, MD;  Location: [facility];  Service: Endoscopy;  Laterality: N/A;  . ESOPHAGOGASTRODUODENOSCOPY (EGD) WITH PROPOFOL N/A 06/29/2019   Procedure: ESOPHAGOGASTRODUODENOSCOPY (EGD) WITH PROPOFOL;  Surgeon: Lucilla Lame, MD;  Location: [facility];  Service: Endoscopy;  Laterality: N/A;  . 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   Marland Kitchen POLYPECTOMY N/A 04/27/2018   Procedure: POLYPECTOMY;  Surgeon: Lucilla Lame, MD;  Location: [facility];  Service: Endoscopy;  Laterality: N/A;    Home Medications:  Allergies as of 02/13/2020      Reactions   Lac Bovis Nausea And Vomiting       Doxycycline Nausea Only   Hot flashes, vivid dreams   Milk-related Compounds Nausea And Vomiting       Sweetness Enhancer    headaches   Azithromycin Rash      Medication List       Accurate as of February 13, 2020  2:38 PM. If you have any questions, ask your nurse or doctor.        acetaminophen 325 MG tablet Commonly known as: TYLENOL Take 650 mg by mouth every 6 (six) hours as needed for moderate pain.   cyclobenzaprine 5 MG  tablet Commonly known as: FLEXERIL Take 1 tablet (5 mg total) by mouth 3 (three) times daily as needed for muscle spasms.   dicyclomine 20 MG tablet Commonly known as: BENTYL Take 1 tablet (20 mg total) by mouth 3 (three) times daily before meals.   ferrous sulfate 325 (65 FE) MG tablet Take 325 mg by mouth every evening.   fluticasone 50 MCG/ACT nasal spray Commonly known as: FLONASE Place 1 spray into both nostrils 2 (two) times daily.   sertraline 100 MG tablet Commonly known as: ZOLOFT TAKE 1 TABLET BY MOUTH EVERY  DAY What changed:   how much to take  when to take this   vitamin B-12 500 MCG tablet Commonly known as: CYANOCOBALAMIN Take 500 mcg by mouth every evening.   vitamin C 1000 MG tablet Take 1,000 mg by mouth every evening.   Vitamin D-1000 Max St 25 MCG (1000 UT) tablet Generic drug: Cholecalciferol Take 1,000 Units by mouth every evening.   zinc gluconate 50 MG tablet Take 50 mg by mouth every evening.       Allergies:  Allergies  Allergen Reactions  . Lac Bovis Nausea And Vomiting       . Doxycycline Nausea Only    Hot flashes, vivid dreams  . Milk-Related Compounds Nausea And Vomiting       . Sweetness Enhancer     headaches  . Azithromycin Rash    Family History: Family History  Problem Relation Age of Onset  . Breast cancer Mother 43  . Hypertension Father   . Kidney disease Father   . Diabetes Father        type 2  . Heart disease Father     Social History:  reports that she has never smoked. She has never used smokeless tobacco. She reports that she does not drink alcohol and does not use drugs.   Physical Exam: BP 138/86   Pulse 78   Ht 5' (1.524 m)   Wt 211 lb (95.7 kg)   LMP 02/12/2020   BMI 41.21 kg/m   Constitutional:  Alert and oriented, No acute distress. HEENT: [place] AT, moist mucus membranes.  Trachea midline, no masses. Cardiovascular: No clubbing, cyanosis, or edema. Respiratory: Normal respiratory effort, no increased work of breathing. Skin: No rashes, bruises or suspicious lesions. Neurologic: Grossly intact, no focal deficits, moving all 4 extremities. Psychiatric: Normal mood and affect.  Urinalysis Shows >30 RBC, 6-10 WBC, nitrite negative, no bacteria  Pertinent image  Results for orders placed or performed in visit on 02/13/20  BLADDER SCAN AMB NON-IMAGING  Result Value Ref Range   Scan Result 5       Assessment & Plan:    1.  Mixed urinary incontinence, stress and urge incontinence/OAB PVR is 5 mL, adequate  empting UA shows >30 RBC, 6-10 WBC, nitrite negative, no bacteria.  There is no concern for infection. Suspected to be related to vaginal bleeding.  We discussed mixed urinary incontinence and the components of stress and urge and how they relate to overall symptomology.  We discussed that each of these of these types of incontinence are treated differently.  For stress incontinence, we discussed weight loss, pelvic floor exercises, consideration of physical therapy and or mechanical options including surgery with bulking agents or sling down the road.  For her urgency and urge incontinence, we discussed behavioral modification.  She is already engaging in good bowel hygiene and fluid intake.  I did offer her pharmacotherapy today.  Given  her upcoming surgery, the like to avoid anticholinergics will exacerbate constipation.  She was given Myrbetriq 25 mg samples to try for the next month.  We will reevaluate her urinary symptoms when she is recovered from surgery as below.  She is agreeable this plan.  -Given 4 Myrbetriq 25 mg daily, # 28 samples; I have advised the patient of the side effects of Myrbetriq, such as: elevation in BP, urinary retention and/or HA    2. Endometrioid carcinoma Newly diagnosed in 01/2020. Underwent cervical biopsy on 01/30/2020. Pathology revealed tiny fragments of squamous epithelium with acute inflammation and reactive changes. Minute strips of benign endocervical epithelium. Deeper sections were examined. Negative for malignancy.  Patient has an upcoming hysterectomy with Dr. Theora Gianotti on 02/20/2020.    Follow up in 1 month.   [facility] [address] [place], [place] [address] [phone]  I, Selena Batten, am acting as a scribe for Dr. Hollice Espy.  I have reviewed the above documentation for accuracy and completeness, and I agree with the above.   Hollice Espy, MD  I spent 45 total minutes on the day of  the encounter including pre-visit review of the medical record, face-to-face time with the patient, and post visit ordering of labs/imaging/tests.

## 2020-02-13 ENCOUNTER — Telehealth: Payer: Self-pay | Admitting: Obstetrics and Gynecology

## 2020-02-13 ENCOUNTER — Encounter
Admission: RE | Admit: 2020-02-13 | Discharge: 2020-02-13 | Disposition: A | Discharge status: Home / Self Care | Payer: BC Managed Care – PPO | Source: Ambulatory Visit | Attending: Obstetrics and Gynecology | Admitting: Obstetrics and Gynecology

## 2020-02-13 ENCOUNTER — Ambulatory Visit (INDEPENDENT_AMBULATORY_CARE_PROVIDER_SITE_OTHER): Payer: BC Managed Care – PPO | Admitting: Urology

## 2020-02-13 ENCOUNTER — Encounter: Payer: Self-pay | Admitting: Urology

## 2020-02-13 ENCOUNTER — Other Ambulatory Visit: Payer: Self-pay

## 2020-02-13 VITALS — BP 138/86 | HR 78 | Ht 60.0 in | Wt 211.0 lb

## 2020-02-13 DIAGNOSIS — R32 Unspecified urinary incontinence: Secondary | ICD-10-CM

## 2020-02-13 HISTORY — DX: Anemia, unspecified: D64.9

## 2020-02-13 LAB — BLADDER SCAN AMB NON-IMAGING: Scan Result: 5

## 2020-02-13 NOTE — Telephone Encounter (Signed)
Pt aware of neg BRCA/MyRisk cancer genetic testing results. IBIS=18%/riskscore=23.1%. Discussed increased breast screening with yearly CBE and mammo, as well as yearly scr breast MRI. Last mammo 6/21. Will do scr breast MRI before 12/21, especially since will meet deductible this yr. Pt to call 10/21 for referral f/u.   Pt recently diagnosed with endometrial cancer.   Patient understands these results only apply to her and her children, and this is not indicative of genetic testing results of her other family members. It is recommended that her other family members have genetic testing done.  Pt also understands negative genetic testing doesn't mean she will never get any of these cancers.   Hard copy mailed to pt. F/u prn.

## 2020-02-13 NOTE — Patient Instructions (Signed)
COVID TESTING Date: February 18, 2020 [facility] Testing site:  [place] Thru Hours:  4:09 am - 1:00 pm Once you are tested, you are asked to stay quarantined (avoiding public places) until after your surgery.   Your procedure is scheduled on: February 20, 2020 [facility] Report to Day Surgery on the 2nd floor of the [place]. To find out your arrival time, please call [phone] between 1PM - 3PM on: Tuesday February 19, 2020  REMEMBER: Instructions that are not followed completely may result in serious medical risk, up to and including death; or upon the discretion of your surgeon and anesthesiologist your surgery may need to be rescheduled.  Do not eat food after midnight the night before surgery.  No gum chewing, lozengers or hard candies.  You may however, drink CLEAR liquids up to 2 hours before you are scheduled to arrive for your surgery. Do not drink anything within 2 hours of your scheduled arrival time.  Clear liquids include: - water  - apple juice without pulp - gatorade (not RED) - black coffee or tea (Do NOT add milk or creamers to the coffee or tea) Do NOT drink anything that is not on this list.  Type 1 and Type 2 diabetics should only drink water.  ENSURE PRE-SURGERY CARBOHYDRATE DRINK:  Complete drinking 2 hours prior to scheduled arrival time.  TAKE THESE MEDICATIONS THE MORNING OF SURGERY WITH A SIP OF WATER: FLEXERIL IF NEEDED  Stop Anti-inflammatories (NSAIDS) such as Advil, Aleve, Ibuprofen, Motrin, Naproxen, Naprosyn and ASPIRIN OR Aspirin based products such as Excedrin, Goodys Powder, BC Powder. (May take Tylenol or Acetaminophen if needed.)  Stop ANY OVER THE COUNTER supplements until after surgery. (May continue Vitamin D, Vitamin B, and multivitamin.)  No Alcohol for 24 hours before or after surgery.  No Smoking including e-cigarettes for 24 hours prior to surgery.  No chewable tobacco products for at  least 6 hours prior to surgery.  No nicotine patches on the day of surgery.  Do not use any "recreational" drugs for at least a week prior to your surgery.  Please be advised that the combination of cocaine and anesthesia may have negative outcomes, up to and including death. If you test positive for cocaine, your surgery will be cancelled.  On the morning of surgery brush your teeth with toothpaste and water, you may rinse your mouth with mouthwash if you wish. Do not swallow any toothpaste or mouthwash.  Do not wear jewelry, make-up, hairpins, clips or nail polish.  Do not wear lotions, powders, or perfumes.   Do not shave 48 hours prior to surgery.   Contact lenses, hearing aids and dentures may not be worn into surgery.  Do not bring valuables to the hospital. [facility] is not responsible for any missing/lost belongings or valuables.   Use CHG Soap as directed on instruction sheet.  Notify your doctor if there is any change in your medical condition (cold, fever, infection).  Wear comfortable clothing (specific to your surgery type) to the hospital.  Plan for stool softeners for home use; pain medications have a tendency to cause constipation. You can also help prevent constipation by eating foods high in fiber such as fruits and vegetables and drinking plenty of fluids as your diet allows.  After surgery, you can help prevent lung complications by doing breathing exercises.  Take deep breaths and cough every 1-2 hours. Your doctor may order a device called an Chiropodist  to help you take deep breaths. When coughing or sneezing, hold a pillow firmly against your incision with both hands. This is called "splinting." Doing this helps protect your incision. It also decreases belly discomfort.  If you are being admitted to the hospital overnight, you may bring a small bag.   If you are being discharged the day of surgery, you will not be allowed to drive home. You will  need a responsible adult (18 years or older) to drive you home and stay with you that night.   If you are taking public transportation, you will need to have a responsible adult (18 years or older) with you. Please confirm with your physician that it is acceptable to use public transportation.   Please call the [facility]. at [phone] if you have any questions about these instructions.  Visitation Policy:  Patients undergoing a surgery or procedure may have one family member or support person with them as long as that person is not COVID-19 positive or experiencing its symptoms.  That person may remain in the waiting area during the procedure.  Children under 4 years of age may have both parents or legal guardians with them during their procedure.  Inpatient Visitation Update:   Two designated support people may visit a patient during visiting hours 7 am to 8 pm. It must be the same two designated people for the duration of the patient stay. The visitors may come and go during the day, and there is no switching out to have different visitors. A mask must be worn at all times, including in the patient room.  Children under 64 years of age:  a total of 4 designated visitors for the child's entire stay are allowed. Only 2 in the room at a time and only one staying overnight at a time. The overnight guest can now rotate during the child's hospital stay.  As a reminder, masks are still required for all [facility] team members, patients and visitors in all [facility] facilities.   Systemwide, no visitors 17 or younger.

## 2020-02-15 ENCOUNTER — Encounter: Payer: Self-pay | Admitting: *Deleted

## 2020-02-15 LAB — URINALYSIS, COMPLETE
Bilirubin, UA: NEGATIVE
Glucose, UA: NEGATIVE
Ketones, UA: NEGATIVE
Nitrite, UA: NEGATIVE
Protein,UA: NEGATIVE
Specific Gravity, UA: 1.02 (ref 1.005–1.030)
Urobilinogen, Ur: 0.2 mg/dL (ref 0.2–1.0)
pH, UA: 5 (ref 5.0–7.5)

## 2020-02-15 LAB — MICROSCOPIC EXAMINATION
Bacteria, UA: NONE SEEN
RBC, Urine: >30 /hpf — AB (ref 0–2)

## 2020-02-17 LAB — CULTURE, URINE COMPREHENSIVE

## 2020-02-18 ENCOUNTER — Encounter: Payer: Self-pay | Admitting: Urgent Care

## 2020-02-18 ENCOUNTER — Other Ambulatory Visit
Admission: RE | Admit: 2020-02-18 | Discharge: 2020-02-18 | Disposition: A | Discharge status: Home / Self Care | Payer: BC Managed Care – PPO | Source: Ambulatory Visit | Attending: Obstetrics and Gynecology | Admitting: Obstetrics and Gynecology

## 2020-02-18 ENCOUNTER — Other Ambulatory Visit: Payer: Self-pay

## 2020-02-18 ENCOUNTER — Encounter: Payer: BC Managed Care – PPO | Admitting: Obstetrics and Gynecology

## 2020-02-18 DIAGNOSIS — Z20822 Contact with and (suspected) exposure to covid-19: Secondary | ICD-10-CM | POA: Diagnosis not present

## 2020-02-18 DIAGNOSIS — Z01812 Encounter for preprocedural laboratory examination: Secondary | ICD-10-CM | POA: Insufficient documentation

## 2020-02-18 LAB — COMPREHENSIVE METABOLIC PANEL
ALT: 18 U/L (ref 0–44)
AST: 22 U/L (ref 15–41)
Albumin: 4.1 g/dL (ref 3.5–5.0)
Alkaline Phosphatase: 53 U/L (ref 38–126)
Anion gap: 8 (ref 5–15)
BUN: 14 mg/dL (ref 6–20)
CO2: 25 mmol/L (ref 22–32)
Calcium: 8.5 mg/dL — ABNORMAL LOW (ref 8.9–10.3)
Chloride: 104 mmol/L (ref 98–111)
Creatinine, Ser: 0.79 mg/dL (ref 0.44–1.00)
GFR calc Af Amer: >60 mL/min (ref >60)
GFR calc non Af Amer: >60 mL/min (ref >60)
Glucose, Bld: 78 mg/dL (ref 70–99)
Potassium: 4.1 mmol/L (ref 3.5–5.1)
Sodium: 137 mmol/L (ref 135–145)
Total Bilirubin: 0.6 mg/dL (ref 0.3–1.2)
Total Protein: 7.3 g/dL (ref 6.5–8.1)

## 2020-02-18 LAB — CBC
HCT: 42.2 % (ref 36.0–46.0)
Hemoglobin: 14.2 g/dL (ref 12.0–15.0)
MCH: 31.3 pg (ref 26.0–34.0)
MCHC: 33.6 g/dL (ref 30.0–36.0)
MCV: 93 fL (ref 80.0–100.0)
Platelets: 237 10*3/uL (ref 150–400)
RBC: 4.54 MIL/uL (ref 3.87–5.11)
RDW: 12.5 % (ref 11.5–15.5)
WBC: 6 10*3/uL (ref 4.0–10.5)
nRBC: 0 % (ref 0.0–0.2)

## 2020-02-18 LAB — TYPE AND SCREEN
ABO/RH(D): O NEG
Antibody Screen: NEGATIVE

## 2020-02-18 LAB — HCG, QUANTITATIVE, PREGNANCY: hCG, Beta Chain, Quant, S: <1 m[IU]/mL (ref <5)

## 2020-02-18 LAB — HEMOGLOBIN A1C
Hgb A1c MFr Bld: 5.3 % (ref 4.8–5.6)
Mean Plasma Glucose: 105 mg/dL

## 2020-02-19 ENCOUNTER — Telehealth: Payer: Self-pay

## 2020-02-19 ENCOUNTER — Ambulatory Visit (INDEPENDENT_AMBULATORY_CARE_PROVIDER_SITE_OTHER): Payer: BC Managed Care – PPO | Admitting: Obstetrics and Gynecology

## 2020-02-19 LAB — SARS CORONAVIRUS 2 (TAT 6-24 HRS): SARS Coronavirus 2: NEGATIVE

## 2020-02-19 NOTE — Telephone Encounter (Signed)
FMLA and [employer] forms completed and faxed with confirmation of receipt.

## 2020-02-20 ENCOUNTER — Ambulatory Visit: Payer: BC Managed Care – PPO | Admitting: Urgent Care

## 2020-02-20 ENCOUNTER — Ambulatory Visit
Admission: RE | Admit: 2020-02-20 | Discharge: 2020-02-20 | Disposition: A | Discharge status: Home / Self Care | Payer: BC Managed Care – PPO | Attending: Obstetrics and Gynecology | Admitting: Obstetrics and Gynecology

## 2020-02-20 ENCOUNTER — Encounter
Admission: RE | Disposition: A | Discharge status: Home / Self Care | Payer: Self-pay | Source: Home / Self Care | Attending: Obstetrics and Gynecology

## 2020-02-20 ENCOUNTER — Other Ambulatory Visit: Payer: Self-pay

## 2020-02-20 ENCOUNTER — Ambulatory Visit: Payer: BC Managed Care – PPO | Admitting: Anesthesiology

## 2020-02-20 ENCOUNTER — Encounter: Payer: Self-pay | Admitting: Obstetrics and Gynecology

## 2020-02-20 DIAGNOSIS — D259 Leiomyoma of uterus, unspecified: Secondary | ICD-10-CM | POA: Insufficient documentation

## 2020-02-20 DIAGNOSIS — G8929 Other chronic pain: Secondary | ICD-10-CM | POA: Diagnosis not present

## 2020-02-20 DIAGNOSIS — Z881 Allergy status to other antibiotic agents status: Secondary | ICD-10-CM | POA: Insufficient documentation

## 2020-02-20 DIAGNOSIS — G709 Myoneural disorder, unspecified: Secondary | ICD-10-CM | POA: Insufficient documentation

## 2020-02-20 DIAGNOSIS — Z87442 Personal history of urinary calculi: Secondary | ICD-10-CM | POA: Insufficient documentation

## 2020-02-20 DIAGNOSIS — N8311 Corpus luteum cyst of right ovary: Secondary | ICD-10-CM | POA: Insufficient documentation

## 2020-02-20 DIAGNOSIS — Z833 Family history of diabetes mellitus: Secondary | ICD-10-CM | POA: Insufficient documentation

## 2020-02-20 DIAGNOSIS — F329 Major depressive disorder, single episode, unspecified: Secondary | ICD-10-CM | POA: Diagnosis not present

## 2020-02-20 DIAGNOSIS — Z841 Family history of disorders of kidney and ureter: Secondary | ICD-10-CM | POA: Diagnosis not present

## 2020-02-20 DIAGNOSIS — N800 Endometriosis of uterus: Secondary | ICD-10-CM | POA: Diagnosis not present

## 2020-02-20 DIAGNOSIS — Z86010 Personal history of colonic polyps: Secondary | ICD-10-CM | POA: Insufficient documentation

## 2020-02-20 DIAGNOSIS — M549 Dorsalgia, unspecified: Secondary | ICD-10-CM | POA: Diagnosis not present

## 2020-02-20 DIAGNOSIS — C541 Malignant neoplasm of endometrium: Secondary | ICD-10-CM | POA: Diagnosis not present

## 2020-02-20 DIAGNOSIS — Z803 Family history of malignant neoplasm of breast: Secondary | ICD-10-CM | POA: Insufficient documentation

## 2020-02-20 DIAGNOSIS — M5136 Other intervertebral disc degeneration, lumbar region: Secondary | ICD-10-CM | POA: Diagnosis not present

## 2020-02-20 DIAGNOSIS — N8312 Corpus luteum cyst of left ovary: Secondary | ICD-10-CM | POA: Diagnosis not present

## 2020-02-20 DIAGNOSIS — E669 Obesity, unspecified: Secondary | ICD-10-CM | POA: Insufficient documentation

## 2020-02-20 DIAGNOSIS — D649 Anemia, unspecified: Secondary | ICD-10-CM | POA: Diagnosis not present

## 2020-02-20 DIAGNOSIS — Z9884 Bariatric surgery status: Secondary | ICD-10-CM | POA: Insufficient documentation

## 2020-02-20 DIAGNOSIS — E559 Vitamin D deficiency, unspecified: Secondary | ICD-10-CM | POA: Diagnosis not present

## 2020-02-20 DIAGNOSIS — Z8249 Family history of ischemic heart disease and other diseases of the circulatory system: Secondary | ICD-10-CM | POA: Diagnosis not present

## 2020-02-20 DIAGNOSIS — Z91011 Allergy to milk products: Secondary | ICD-10-CM | POA: Insufficient documentation

## 2020-02-20 DIAGNOSIS — Z6841 Body Mass Index (BMI) 40.0 and over, adult: Secondary | ICD-10-CM | POA: Diagnosis not present

## 2020-02-20 DIAGNOSIS — Z9102 Food additives allergy status: Secondary | ICD-10-CM | POA: Insufficient documentation

## 2020-02-20 DIAGNOSIS — E785 Hyperlipidemia, unspecified: Secondary | ICD-10-CM | POA: Insufficient documentation

## 2020-02-20 DIAGNOSIS — Z888 Allergy status to other drugs, medicaments and biological substances status: Secondary | ICD-10-CM | POA: Insufficient documentation

## 2020-02-20 DIAGNOSIS — R32 Unspecified urinary incontinence: Secondary | ICD-10-CM | POA: Insufficient documentation

## 2020-02-20 DIAGNOSIS — Z79899 Other long term (current) drug therapy: Secondary | ICD-10-CM | POA: Insufficient documentation

## 2020-02-20 DIAGNOSIS — F419 Anxiety disorder, unspecified: Secondary | ICD-10-CM | POA: Insufficient documentation

## 2020-02-20 HISTORY — PX: SENTINEL NODE BIOPSY: SHX6608

## 2020-02-20 HISTORY — PX: ROBOTIC ASSISTED TOTAL HYSTERECTOMY WITH BILATERAL SALPINGO OOPHERECTOMY: SHX6086

## 2020-02-20 LAB — ABO/RH: ABO/RH(D): O NEG

## 2020-02-20 SURGERY — HYSTERECTOMY, TOTAL, ROBOT-ASSISTED, LAPAROSCOPIC, WITH BILATERAL SALPINGO-OOPHORECTOMY
Anesthesia: General

## 2020-02-20 MED ORDER — PROMETHAZINE HCL 25 MG/ML IJ SOLN: 6.2500 mg | Freq: Once | INTRAMUSCULAR | Status: AC

## 2020-02-20 MED ORDER — ORAL CARE MOUTH RINSE: 15.0000 mL | Freq: Once | OROMUCOSAL | Status: AC

## 2020-02-20 MED ORDER — DEXAMETHASONE SODIUM PHOSPHATE 10 MG/ML IJ SOLN
INTRAMUSCULAR | Status: AC
  Filled 2020-02-20: qty 1

## 2020-02-20 MED ORDER — IBUPROFEN 800 MG PO TABS: 800.0000 mg | ORAL_TABLET | Freq: Three times a day (TID) | ORAL | 0 refills | Status: DC | PRN

## 2020-02-20 MED ORDER — MIDAZOLAM HCL 2 MG/2ML IJ SOLN
INTRAMUSCULAR | Status: AC
  Filled 2020-02-20: qty 2

## 2020-02-20 MED ORDER — FAMOTIDINE 20 MG PO TABS
ORAL_TABLET | ORAL | Status: AC
  Administered 2020-02-20: 20 mg via ORAL
  Filled 2020-02-20: qty 1

## 2020-02-20 MED ORDER — OXYCODONE HCL 5 MG PO TABS
5.0000 mg | ORAL_TABLET | Freq: Once | ORAL | Status: AC | PRN
  Administered 2020-02-20: 5 mg via ORAL

## 2020-02-20 MED ORDER — LIDOCAINE HCL (CARDIAC) PF 100 MG/5ML IV SOSY
PREFILLED_SYRINGE | INTRAVENOUS | Status: DC | PRN
  Administered 2020-02-20: 100 mg via INTRAVENOUS

## 2020-02-20 MED ORDER — FENTANYL CITRATE (PF) 100 MCG/2ML IJ SOLN
INTRAMUSCULAR | Status: AC
  Administered 2020-02-20: 50 ug via INTRAVENOUS
  Filled 2020-02-20: qty 2

## 2020-02-20 MED ORDER — KETAMINE HCL 50 MG/ML IJ SOLN
INTRAMUSCULAR | Status: AC
  Filled 2020-02-20: qty 10

## 2020-02-20 MED ORDER — PROMETHAZINE HCL 25 MG/ML IJ SOLN
INTRAMUSCULAR | Status: AC
  Administered 2020-02-20: 6.25 mg via INTRAVENOUS
  Filled 2020-02-20: qty 1

## 2020-02-20 MED ORDER — HEPARIN SODIUM (PORCINE) 5000 UNIT/ML IJ SOLN
INTRAMUSCULAR | Status: AC
  Administered 2020-02-20: 5000 [IU] via SUBCUTANEOUS
  Filled 2020-02-20: qty 1

## 2020-02-20 MED ORDER — KETAMINE HCL 10 MG/ML IJ SOLN
INTRAMUSCULAR | Status: DC | PRN
Start: 2020-02-20 — End: 2020-02-20
  Administered 2020-02-20: 50 mg via INTRAVENOUS
  Administered 2020-02-20: 30 mg via INTRAVENOUS

## 2020-02-20 MED ORDER — LIDOCAINE HCL 4 % MT SOLN
OROMUCOSAL | Status: DC | PRN
  Administered 2020-02-20: 4 mL via TOPICAL

## 2020-02-20 MED ORDER — PROPOFOL 10 MG/ML IV BOLUS
INTRAVENOUS | Status: DC | PRN
  Administered 2020-02-20: 170 mg via INTRAVENOUS

## 2020-02-20 MED ORDER — DEXAMETHASONE SODIUM PHOSPHATE 10 MG/ML IJ SOLN
INTRAMUSCULAR | Status: DC | PRN
  Administered 2020-02-20: 10 mg via INTRAVENOUS

## 2020-02-20 MED ORDER — HYDROCODONE-ACETAMINOPHEN 5-325 MG PO TABS: 1.0000 | ORAL_TABLET | Freq: Four times a day (QID) | ORAL | 0 refills | Status: DC | PRN

## 2020-02-20 MED ORDER — ACETAMINOPHEN 500 MG PO TABS: 1000.0000 mg | ORAL_TABLET | ORAL | Status: AC

## 2020-02-20 MED ORDER — CHLORHEXIDINE GLUCONATE 0.12 % MT SOLN
OROMUCOSAL | Status: AC
  Administered 2020-02-20: 15 mL via OROMUCOSAL
  Filled 2020-02-20: qty 15

## 2020-02-20 MED ORDER — SUCCINYLCHOLINE CHLORIDE 20 MG/ML IJ SOLN
INTRAMUSCULAR | Status: DC | PRN
  Administered 2020-02-20: 140 mg via INTRAVENOUS

## 2020-02-20 MED ORDER — ROCURONIUM BROMIDE 100 MG/10ML IV SOLN
INTRAVENOUS | Status: DC | PRN
  Administered 2020-02-20: 20 mg via INTRAVENOUS
  Administered 2020-02-20: 40 mg via INTRAVENOUS
  Administered 2020-02-20: 20 mg via INTRAVENOUS
  Administered 2020-02-20: 30 mg via INTRAVENOUS
  Administered 2020-02-20: 10 mg via INTRAVENOUS

## 2020-02-20 MED ORDER — MIDAZOLAM HCL 2 MG/2ML IJ SOLN
INTRAMUSCULAR | Status: DC | PRN
  Administered 2020-02-20: 2 mg via INTRAVENOUS

## 2020-02-20 MED ORDER — ROCURONIUM BROMIDE 10 MG/ML (PF) SYRINGE
PREFILLED_SYRINGE | INTRAVENOUS | Status: AC
  Filled 2020-02-20: qty 10

## 2020-02-20 MED ORDER — FENTANYL CITRATE (PF) 100 MCG/2ML IJ SOLN
INTRAMUSCULAR | Status: AC
  Administered 2020-02-20: 25 ug via INTRAVENOUS
  Filled 2020-02-20: qty 2

## 2020-02-20 MED ORDER — LACTATED RINGERS IV SOLN: INTRAVENOUS | Status: DC

## 2020-02-20 MED ORDER — SUCCINYLCHOLINE CHLORIDE 200 MG/10ML IV SOSY
PREFILLED_SYRINGE | INTRAVENOUS | Status: AC
  Filled 2020-02-20: qty 10

## 2020-02-20 MED ORDER — OXYCODONE HCL 5 MG/5ML PO SOLN: 5.0000 mg | Freq: Once | ORAL | Status: AC | PRN

## 2020-02-20 MED ORDER — PROPOFOL 10 MG/ML IV BOLUS
INTRAVENOUS | Status: AC
  Filled 2020-02-20: qty 20

## 2020-02-20 MED ORDER — ONDANSETRON HCL 4 MG/2ML IJ SOLN: 4.0000 mg | Freq: Once | INTRAMUSCULAR | Status: AC

## 2020-02-20 MED ORDER — SODIUM CHLORIDE 0.9 % IV SOLN
INTRAVENOUS | Status: DC | PRN
  Administered 2020-02-20: 20 ug/min via INTRAVENOUS

## 2020-02-20 MED ORDER — STERILE WATER FOR INJECTION IJ SOLN
INTRAMUSCULAR | Status: DC | PRN
  Administered 2020-02-20: 4 mL via INTRAVENOUS

## 2020-02-20 MED ORDER — OXYCODONE HCL 5 MG PO TABS
ORAL_TABLET | ORAL | Status: AC
  Filled 2020-02-20: qty 1

## 2020-02-20 MED ORDER — LIDOCAINE HCL (PF) 2 % IJ SOLN
INTRAMUSCULAR | Status: AC
  Filled 2020-02-20: qty 5

## 2020-02-20 MED ORDER — INDOCYANINE GREEN 25 MG IV SOLR
INTRAVENOUS | Status: AC
  Filled 2020-02-20: qty 10

## 2020-02-20 MED ORDER — HEMOSTATIC AGENTS (NO CHARGE) OPTIME
TOPICAL | Status: DC | PRN
  Administered 2020-02-20: 2 via TOPICAL

## 2020-02-20 MED ORDER — SODIUM CHLORIDE FLUSH 0.9 % IV SOLN
INTRAVENOUS | Status: AC
  Filled 2020-02-20: qty 10

## 2020-02-20 MED ORDER — PHENYLEPHRINE HCL (PRESSORS) 10 MG/ML IV SOLN
INTRAVENOUS | Status: DC | PRN
  Administered 2020-02-20: 200 ug via INTRAVENOUS
  Administered 2020-02-20: 100 ug via INTRAVENOUS
  Administered 2020-02-20: 200 ug via INTRAVENOUS
  Administered 2020-02-20 (×2): 100 ug via INTRAVENOUS

## 2020-02-20 MED ORDER — ONDANSETRON HCL 4 MG/2ML IJ SOLN
INTRAMUSCULAR | Status: AC
  Administered 2020-02-20: 4 mg via INTRAVENOUS
  Filled 2020-02-20: qty 2

## 2020-02-20 MED ORDER — LACTATED RINGERS IV SOLN
INTRAVENOUS | Status: DC | PRN
Start: 2020-02-20 — End: 2020-02-20

## 2020-02-20 MED ORDER — FENTANYL CITRATE (PF) 100 MCG/2ML IJ SOLN
INTRAMUSCULAR | Status: AC
  Filled 2020-02-20: qty 2

## 2020-02-20 MED ORDER — BUPIVACAINE HCL (PF) 0.5 % IJ SOLN
INTRAMUSCULAR | Status: AC
  Filled 2020-02-20: qty 30

## 2020-02-20 MED ORDER — CHLORHEXIDINE GLUCONATE 0.12 % MT SOLN: 15.0000 mL | Freq: Once | OROMUCOSAL | Status: AC

## 2020-02-20 MED ORDER — MICROFIBRILLAR COLL HEMOSTAT EX PADS: MEDICATED_PAD | CUTANEOUS | Status: DC | PRN

## 2020-02-20 MED ORDER — LIDOCAINE HCL 1 % IJ SOLN
INTRAMUSCULAR | Status: DC | PRN
  Administered 2020-02-20: 20 mL via INTRADERMAL

## 2020-02-20 MED ORDER — CEFAZOLIN SODIUM-DEXTROSE 2-4 GM/100ML-% IV SOLN
INTRAVENOUS | Status: AC
  Filled 2020-02-20: qty 100

## 2020-02-20 MED ORDER — FAMOTIDINE 20 MG PO TABS: 20.0000 mg | ORAL_TABLET | Freq: Once | ORAL | Status: AC

## 2020-02-20 MED ORDER — FENTANYL CITRATE (PF) 250 MCG/5ML IJ SOLN
INTRAMUSCULAR | Status: DC | PRN
  Administered 2020-02-20 (×4): 50 ug via INTRAVENOUS

## 2020-02-20 MED ORDER — ACETAMINOPHEN 10 MG/ML IV SOLN
INTRAVENOUS | Status: AC
  Filled 2020-02-20: qty 100

## 2020-02-20 MED ORDER — ACETAMINOPHEN 500 MG PO TABS
ORAL_TABLET | ORAL | Status: AC
  Administered 2020-02-20: 1000 mg via ORAL
  Filled 2020-02-20: qty 2

## 2020-02-20 MED ORDER — HEPARIN SODIUM (PORCINE) 5000 UNIT/ML IJ SOLN: 5000.0000 [IU] | INTRAMUSCULAR | Status: AC

## 2020-02-20 MED ORDER — ONDANSETRON HCL 4 MG/2ML IJ SOLN
INTRAMUSCULAR | Status: AC
  Filled 2020-02-20: qty 2

## 2020-02-20 MED ORDER — FENTANYL CITRATE (PF) 100 MCG/2ML IJ SOLN
25.0000 ug | INTRAMUSCULAR | Status: AC | PRN
  Administered 2020-02-20: 50 ug via INTRAVENOUS
  Administered 2020-02-20 (×3): 25 ug via INTRAVENOUS

## 2020-02-20 MED ORDER — CEFAZOLIN SODIUM-DEXTROSE 2-4 GM/100ML-% IV SOLN
2.0000 g | INTRAVENOUS | Status: AC
  Administered 2020-02-20: 2 g via INTRAVENOUS

## 2020-02-20 MED ORDER — LIDOCAINE HCL (PF) 1 % IJ SOLN
INTRAMUSCULAR | Status: AC
  Filled 2020-02-20: qty 30

## 2020-02-20 MED ORDER — ONDANSETRON HCL 4 MG/2ML IJ SOLN
INTRAMUSCULAR | Status: DC | PRN
  Administered 2020-02-20: 4 mg via INTRAVENOUS

## 2020-02-20 MED ORDER — VASOPRESSIN 20 UNIT/ML IV SOLN
INTRAVENOUS | Status: DC | PRN
Start: 2020-02-20 — End: 2020-02-20
  Administered 2020-02-20 (×3): 2 [IU] via INTRAVENOUS
  Administered 2020-02-20: 1 [IU] via INTRAVENOUS
  Administered 2020-02-20: 2 [IU] via INTRAVENOUS

## 2020-02-20 MED ORDER — PHENYLEPHRINE HCL (PRESSORS) 10 MG/ML IV SOLN
INTRAVENOUS | Status: AC
  Filled 2020-02-20: qty 1

## 2020-02-20 MED ORDER — SUGAMMADEX SODIUM 200 MG/2ML IV SOLN
INTRAVENOUS | Status: DC | PRN
  Administered 2020-02-20: 200 mg via INTRAVENOUS

## 2020-02-20 SURGICAL SUPPLY — 71 items
ADH SKN CLS APL DERMABOND .7 (GAUZE/BANDAGES/DRESSINGS) ×1
APL PRP STRL LF DISP 70% ISPRP (MISCELLANEOUS) ×1
BAG DRN RND TRDRP ANRFLXCHMBR (UROLOGICAL SUPPLIES) ×1
BAG URINE DRAIN 2000ML AR STRL (UROLOGICAL SUPPLIES) ×2 IMPLANT
BLADE SURG SZ11 CARB STEEL (BLADE) ×4 IMPLANT
CANISTER SUCT 1200ML W/VALVE (MISCELLANEOUS) ×2 IMPLANT
CATH FOLEY 2WAY  5CC 16FR (CATHETERS) ×2
CATH FOLEY 2WAY 5CC 16FR (CATHETERS) ×1
CATH URTH 16FR FL 2W BLN LF (CATHETERS) ×1 IMPLANT
CHLORAPREP W/TINT 26 (MISCELLANEOUS) ×2 IMPLANT
COVER TIP SHEARS 8 DVNC (MISCELLANEOUS) ×1 IMPLANT
COVER TIP SHEARS 8MM DA VINCI (MISCELLANEOUS) ×2
COVER WAND RF STERILE (DRAPES) ×2 IMPLANT
DEFOGGER SCOPE WARMER CLEARIFY (MISCELLANEOUS) ×2 IMPLANT
DERMABOND ADVANCED (GAUZE/BANDAGES/DRESSINGS) ×1
DERMABOND ADVANCED .7 DNX12 (GAUZE/BANDAGES/DRESSINGS) ×1 IMPLANT
DRAPE 3/4 80X56 (DRAPES) ×6 IMPLANT
DRAPE ARM DVNC X/XI (DISPOSABLE) ×4 IMPLANT
DRAPE COLUMN DVNC XI (DISPOSABLE) ×1 IMPLANT
DRAPE DA VINCI XI ARM (DISPOSABLE) ×8
DRAPE DA VINCI XI COLUMN (DISPOSABLE) ×2
DRAPE LEGGINS SURG 28X43 STRL (DRAPES) ×2 IMPLANT
DRAPE UNDER BUTTOCK W/FLU (DRAPES) ×2 IMPLANT
DRESSING SURGICEL FIBRLLR 1X2 (HEMOSTASIS) IMPLANT
DRSG SURGICEL FIBRILLAR 1X2 (HEMOSTASIS) ×4
DRSG TELFA 3X8 NADH (GAUZE/BANDAGES/DRESSINGS) ×2 IMPLANT
ELECT REM PT RETURN 9FT ADLT (ELECTROSURGICAL) ×2
ELECTRODE REM PT RTRN 9FT ADLT (ELECTROSURGICAL) ×1 IMPLANT
GLOVE SURG SYN 6.5 ES PF (GLOVE) ×8 IMPLANT
GLOVE SURG SYN 6.5 PF PI (GLOVE) ×4 IMPLANT
GOWN STRL REUS W/ TWL LRG LVL3 (GOWN DISPOSABLE) ×4 IMPLANT
GOWN STRL REUS W/TWL LRG LVL3 (GOWN DISPOSABLE) ×8
IRRIGATION STRYKERFLOW (MISCELLANEOUS) IMPLANT
IRRIGATOR STRYKERFLOW (MISCELLANEOUS) ×2
IV NS 1000ML (IV SOLUTION) ×2
IV NS 1000ML BAXH (IV SOLUTION) IMPLANT
KIT PINK PAD W/HEAD ARE REST (MISCELLANEOUS) ×2
KIT PINK PAD W/HEAD ARM REST (MISCELLANEOUS) ×1 IMPLANT
LABEL OR SOLS (LABEL) ×2 IMPLANT
MANIPULATOR VCARE LG CRV RETR (MISCELLANEOUS) ×1 IMPLANT
MANIPULATOR VCARE SML CRV RETR (MISCELLANEOUS) IMPLANT
MANIPULATOR VCARE STD CRV RETR (MISCELLANEOUS) IMPLANT
NDL SPNL 20GX3.5 QUINCKE YW (NEEDLE) IMPLANT
NEEDLE HYPO 22GX1.5 SAFETY (NEEDLE) ×2 IMPLANT
NEEDLE SPNL 20GX3.5 QUINCKE YW (NEEDLE) ×2 IMPLANT
NEEDLE VERESS 14GA 120MM (NEEDLE) ×2 IMPLANT
NS IRRIG 1000ML POUR BTL (IV SOLUTION) ×2 IMPLANT
OBTURATOR OPTICAL STANDARD 8MM (TROCAR) ×2
OBTURATOR OPTICAL STND 8 DVNC (TROCAR) ×1
OBTURATOR OPTICALSTD 8 DVNC (TROCAR) ×1 IMPLANT
PACK LAP CHOLECYSTECTOMY (MISCELLANEOUS) ×2 IMPLANT
PAD DRESSING TELFA 3X8 NADH (GAUZE/BANDAGES/DRESSINGS) IMPLANT
PAD OB MATERNITY 4.3X12.25 (PERSONAL CARE ITEMS) ×2 IMPLANT
PAD PREP 24X41 OB/GYN DISP (PERSONAL CARE ITEMS) ×2 IMPLANT
PENCIL ELECTRO HAND CTR (MISCELLANEOUS) ×2 IMPLANT
SEAL CANN UNIV 5-8 DVNC XI (MISCELLANEOUS) ×4 IMPLANT
SEAL XI 5MM-8MM UNIVERSAL (MISCELLANEOUS) ×8
SEALER VESSEL DA VINCI XI (MISCELLANEOUS)
SEALER VESSEL EXT DVNC XI (MISCELLANEOUS) IMPLANT
SET CYSTO W/LG BORE CLAMP LF (SET/KITS/TRAYS/PACK) IMPLANT
SET TUBE SMOKE EVAC HIGH FLOW (TUBING) ×2 IMPLANT
SOLUTION ELECTROLUBE (MISCELLANEOUS) ×2 IMPLANT
SUT DVC VLOC 180 0 12IN GS21 (SUTURE)
SUT MNCRL 4-0 (SUTURE) ×2
SUT MNCRL 4-0 27XMFL (SUTURE) ×1
SUT VIC AB 0 CT1 36 (SUTURE) ×2 IMPLANT
SUT VICRYL 0 AB UR-6 (SUTURE) ×2 IMPLANT
SUTURE DVC VLC 180 0 12IN GS21 (SUTURE) IMPLANT
SUTURE MNCRL 4-0 27XMF (SUTURE) ×1 IMPLANT
SYR 10ML LL (SYRINGE) ×2 IMPLANT
TROCAR XCEL NON-BLD 5MMX100MML (ENDOMECHANICALS) ×1 IMPLANT

## 2020-02-20 NOTE — Interval H&P Note (Signed)
History and Physical Interval Note:  02/20/2020 7:14 AM  Susan Strickland  has presented today for surgery, with the diagnosis of Endometrial Cancer.  The various methods of treatment have been discussed with the patient and family. After consideration of risks, benefits and other options for treatment, the patient has consented to  Procedure(s): XI ROBOTIC ASSISTED TOTAL HYSTERECTOMY WITH BILATERAL SALPINGO OOPHORECTOMY (N/A) SENTINEL NODE BIOPSY, POSSIBLE PERI-AORTIC LYMPH NODE DISSECTION (N/A) as a surgical intervention.  [name] history has been reviewed, patient examined, no change in status, stable for surgery.  I have reviewed the patient's chart and labs.  Questions were answered to the patient's satisfaction.     [place]

## 2020-02-20 NOTE — Anesthesia Preprocedure Evaluation (Signed)
Anesthesia Evaluation  Patient identified by MRN, date of birth, ID band Patient awake    Reviewed: Allergy & Precautions, H&P , NPO status , Patient's Chart, lab work & pertinent test results  History of Anesthesia Complications Negative for: history of anesthetic complications  Airway Mallampati: III  TM Distance: <3 FB Neck ROM: full    Dental  (+) Chipped   Pulmonary neg pulmonary ROS, neg shortness of breath,    Pulmonary exam normal        Cardiovascular Exercise Tolerance: Good (-) angina(-) Past MI and (-) DOE negative cardio ROS Normal cardiovascular exam     Neuro/Psych PSYCHIATRIC DISORDERS  Neuromuscular disease negative psych ROS   GI/Hepatic negative GI ROS, Neg liver ROS, neg GERD  ,  Endo/Other  negative endocrine ROS  Renal/GU Renal disease     Musculoskeletal   Abdominal   Peds  Hematology negative hematology ROS (+)   Anesthesia Other Findings Past Medical History: No date: Anemia No date: Anxiety No date: Breast cyst No date: Degenerative disc disease, lumbar No date: Depression 05/2012; 09/24/2014: History of mammogram     Comment:  wnl per pt; benign 08/15/2012: History of Papanicolaou smear of cervix     Comment:  -/- No date: Hyperlipemia No date: Vitamin D deficiency  Past Surgical History: 2015: BACK SURGERY     Comment:  L5 2016: BREAST BIOPSY; Left     Comment:  NEG 04/27/2018: COLONOSCOPY WITH PROPOFOL; N/A     Comment:  Procedure: COLONOSCOPY WITH BIOPSIES;  Surgeon: Lucilla Lame, MD;  Location: [place];  Service:               Endoscopy;  Laterality: N/A; 04/27/2018: ESOPHAGOGASTRODUODENOSCOPY (EGD) WITH PROPOFOL; N/A     Comment:  Procedure: ESOPHAGOGASTRODUODENOSCOPY (EGD) WITH               BIOPSIES;  Surgeon: Lucilla Lame, MD;  Location: [place];  Service: Endoscopy;  Laterality: N/A; 06/29/2019:  ESOPHAGOGASTRODUODENOSCOPY (EGD) WITH PROPOFOL; N/A     Comment:  Procedure: ESOPHAGOGASTRODUODENOSCOPY (EGD) WITH               PROPOFOL;  Surgeon: Lucilla Lame, MD;  Location: [facility]               ENDOSCOPY;  Service: Endoscopy;  Laterality: N/A; 03/2014: GASTRIC BYPASS     Comment:  gastric sleeve.  04/27/2018: POLYPECTOMY; N/A     Comment:  Procedure: POLYPECTOMY;  Surgeon: Lucilla Lame, MD;                Location: [place];  Service: Endoscopy;                Laterality: N/A;     Reproductive/Obstetrics negative OB ROS                             Anesthesia Physical Anesthesia Plan  ASA: III  Anesthesia Plan: General ETT   Post-op Pain Management:    Induction: Intravenous  PONV Risk Score and Plan: Ondansetron, Dexamethasone, Midazolam and Treatment may vary due to age or medical condition  Airway Management Planned: Oral ETT  Additional Equipment:   Intra-op Plan:   Post-operative Plan: Extubation in OR  Informed Consent: I have reviewed the patients History  and Physical, chart, labs and discussed the procedure including the risks, benefits and alternatives for the proposed anesthesia with the patient or authorized representative who has indicated his/her understanding and acceptance.     Dental Advisory Given  Plan Discussed with: Anesthesiologist, CRNA and Surgeon  Anesthesia Plan Comments: (Patient consented for risks of anesthesia including but not limited to:  - adverse reactions to medications - damage to eyes, teeth, lips or other oral mucosa - nerve damage due to positioning  - sore throat or hoarseness - Damage to heart, brain, nerves, lungs, other parts of body or loss of life  Patient voiced understanding.)        Anesthesia Quick Evaluation

## 2020-02-20 NOTE — Progress Notes (Signed)
   02/20/20 0730  Clinical Encounter Type  Visited With Patient  Visit Type Initial  Referral From Chaplain  Consult/Referral To Chaplain  Chaplain briefly visited with patient prior to her procedure. Patient said she is doing fine. Chaplain wished her well and left.

## 2020-02-20 NOTE — Op Note (Signed)
Operative Note   Date 02/20/2020 TIME 11:48 AM  PRE-OP DIAGNOSIS: Grade 1 endometrial cancer  POST-OP DIAGNOSIS: Grade 1 endometrial cancer, stage I pending final pathology  SURGEON: Surgeon(s) and Role: Panel 1:  Sabastien Tyler Gaetana Michaelis, MD  ASSISTANT:  Adrian Prows, MD  ANESTHESIA: General  PROCEDURE: Procedure(s): Exam under anesthesia, Robotic assisted total hysterectomy and bilateral salpingo-oophorectomy with sentinel node injection, mapping, and biopsies.  ESTIMATED BLOOD LOSS: <50 cc  DRAINS: Foley  TOTAL IV FLUIDS: 1000 cc  UOP: approximately 200 cc  SPECIMENS:  Uterus, and bilateral fallopian tubes and ovaries, bilateral obturator sentinel nodes, and washings.   COMPLICATIONS: None  DISPOSITION: PACU  CONDITION: Stable  INDICATIONS: Grade 1 endometrial cancer  FINDINGS: Exam under anesthesia revealed an normal 8-10 week mobile anteverted uterus. There were no adnexal masses or nodularity. The parametria was smooth. The cervix was negative for gross lesions but was parous and enlarged. Intraoperative findings included: The uterus was normal size and shape. The adnexa were normal bilaterally. The upper abdomen was normal including omentum, bowel, liver, stomach, and diaphragmatic surfaces. There was no evidence of grossly enlarged pelvic or right para-aortic lymph nodes. The sentinel nodes mapped to the right obturator and left mid obturator node.   PROCEDURE IN DETAIL: After informed consent was obtained, the patient was taken to the operating room where anesthesia was obtained without difficulty. The patient was positioned in the dorsal lithotomy position in [place] and her arms were carefully tucked at her sides and the usual precautions were taken.  She was prepped and draped in normal sterile fashion.  Time-out was performed.  A speculum was placed in the vagina and the cervical os was dilator. The uterus sounded to 8 cm.  A large V-Care uterine  manipulator was then placed in the uterus without incident.    Operative entry was obtained via a supraumbilical incision and direct entry. The Hasson port was placed, abdomen insuffulated, and pelvis visualized with noted findings above.  The patient was placed in Trendelenburg and the bowel was displaced up into the upper abdomen.  The robotic ports and LUQ port placement, and robotic docking was performed. Round ligaments were divided on each side and the retroperitoneal space was opened bilaterally. On the right the round ligament was oozing and retracted. The round ligament was isolated and bleeding controlled with cautery and fibrillar.  With hemostasis secured, the infundibulopelvic ligaments were skeletonized, and the ureters were identified and preserved.  The retroperitoneal spaces were opened and sentinel nodes identified as well as the iliac vessels and obturator nerves. The sentinel nodes were removed and placed in th paravesical spaces.   The bilateral infundibular ligaments were sealed and divided.  A bladder flap was created and the bladder was dissected down off the lower uterine segment and cervix. The uterus was manipulated to allow exposure of the uterine arteries. The uterine arteries were skeletonized bilaterally, sealed and divided with cautery and transected.  A colpotomy was performed circumferentially along the V-Care ring with electrocautery and the cervix was incised from the vagina. The V-care and specimen was removed.  A pneumo balloon was placed in the vagina and ring forceps used to removed both sentinel lymph nodes. The vaginal cuff was then closed in a running continuous fashion using 0 V-Lock suture with careful attention to include the vaginal cuff angles and the vaginal mucosa within the closure.    Hemostasis was observed. The intraperitoneal pressure was dropped, and all planes of dissection, vascular pedicles and the  vaginal cuff were found to be hemostatic.  Fibrillar  was placed on raw surfaces. The right round ligament was hemostatic. The trocars were removed and the fascia of the umbilical port was closed with 0 Vicryl suture using running technique.   The skin incision at the umbilicus was closed with subcuticular stitch.  The remaining skin incisions were closed with subcuticular stitch and Indermil glue.  The patient tolerated the procedure well.  Sponge, lap and needle counts were correct x2.  The patient was taken to recovery room in excellent condition.  Antibiotics: Given 1st or 2nd generation cephalosporin, Antibiotics given within 1 hour of the start of the procedure, Antibiotics ordered to be discontinued within 24 hours post procedure   VTE prophylaxis: was ordered perioperatively.   Dr. Gilman Schmidt assisted me with the procedure which could not have been performed without her assistance.   Gillis Ends, MD

## 2020-02-20 NOTE — Anesthesia Postprocedure Evaluation (Signed)
Anesthesia Post Note  Patient: Susan Strickland  Procedure(s) Performed: XI ROBOTIC ASSISTED TOTAL HYSTERECTOMY WITH BILATERAL SALPINGO OOPHORECTOMY (N/A ) SENTINEL NODE DISSECTION (N/A )  Patient location during evaluation: PACU Anesthesia Type: General Level of consciousness: awake and alert Pain management: pain level controlled Vital Signs Assessment: post-procedure vital signs reviewed and stable Respiratory status: spontaneous breathing, nonlabored ventilation, respiratory function stable and patient connected to nasal cannula oxygen Cardiovascular status: blood pressure returned to baseline and stable Postop Assessment: no apparent nausea or vomiting Anesthetic complications: no   No complications documented.   Last Vitals:  Vitals:   02/20/20 1249 02/20/20 1304  BP: 121/76 122/69  Pulse: 96 97  Resp: 12 12  Temp:  36.6 C  SpO2: 95% 96%    Last Pain:  Vitals:   02/20/20 1304  TempSrc:   PainSc: 6                  Precious Haws Keelan Pomerleau

## 2020-02-20 NOTE — Transfer of Care (Signed)
Immediate Anesthesia Transfer of Care Note  Patient: Susan Strickland  Procedure(s) Performed: XI ROBOTIC ASSISTED TOTAL HYSTERECTOMY WITH BILATERAL SALPINGO OOPHORECTOMY (N/A ) SENTINEL NODE DISSECTION (N/A )  Patient Location: PACU  Anesthesia Type:General  Level of Consciousness: awake and patient cooperative  Airway & Oxygen Therapy: Patient Spontanous Breathing and Patient connected to face mask oxygen  Post-op Assessment: Report given to RN and Post -op Vital signs reviewed and stable  Post vital signs: Reviewed and stable  Last Vitals:  Vitals Value Taken Time  BP 117/50 02/20/20 1104  Temp 37.4 C 02/20/20 1104  Pulse 104 02/20/20 1107  Resp 19 02/20/20 1107  SpO2 100 % 02/20/20 1107  Vitals shown include unvalidated device data.  Last Pain:  Vitals:   02/20/20 0621  TempSrc: Temporal  PainSc: 0-No pain         Complications: No complications documented.

## 2020-02-20 NOTE — Discharge Instructions (Signed)
Total Laparoscopic Hysterectomy, Care After This sheet gives you information about how to care for yourself after your procedure. Your health care provider may also give you more specific instructions. If you have problems or questions, contact your health care provider. What can I expect after the procedure? After the procedure, it is common to have:  Pain and bruising around your incisions.  A sore throat, if a breathing tube was used during surgery.  Fatigue.  Poor appetite.  Less interest in sex. If your ovaries were also removed, it is also common to have symptoms of menopause such as hot flashes, night sweats, and lack of sleep (insomnia). Follow these instructions at home: Bathing  Do not take baths, swim, or use a hot tub until your health care provider approves. You may need to only take showers for 2-3 weeks.  Keep your bandage (dressing) dry until your health care provider says it can be removed. Incision care   Follow instructions from your health care provider about how to take care of your incisions. Make sure you: ? Wash your hands with soap and water before you change your dressing. If soap and water are not available, use hand sanitizer. ? Change your dressing as told by your health care provider. ? Leave stitches (sutures), skin glue, or adhesive strips in place. These skin closures may need to stay in place for 2 weeks or longer. If adhesive strip edges start to loosen and curl up, you may trim the loose edges. Do not remove adhesive strips completely unless your health care provider tells you to do that.  Check your incision area every day for signs of infection. Check for: ? Redness, swelling, or pain. ? Fluid or blood. ? Warmth. ? Pus or a bad smell. Activity  Get plenty of rest and sleep.  Do not lift anything that is heavier than 10 lbs (4.5 kg) for one month after surgery, or as long as told by your health care provider.  Do not drive or use heavy  machinery while taking prescription pain medicine.  Do not drive for 24 hours if you were given a medicine to help you relax (sedative).  Return to your normal activities as told by your health care provider. Ask your health care provider what activities are safe for you. Lifestyle   Do not use any products that contain nicotine or tobacco, such as cigarettes and e-cigarettes. These can delay healing. If you need help quitting, ask your health care provider.  Do not drink alcohol until your health care provider approves. General instructions  Do not douche, use tampons, or have sex for at least 6 weeks, or as told by your health care provider.  Take over-the-counter and prescription medicines only as told by your health care provider.  To monitor yourself for a fever, take your temperature at least once a day during recovery.  If you struggle with physical or emotional changes after your procedure, speak with your health care provider or a therapist.  To prevent or treat constipation while you are taking prescription pain medicine, your health care provider may recommend that you: ? Drink enough fluid to keep your urine clear or pale yellow. ? Take over-the-counter or prescription medicines. ? Eat foods that are high in fiber, such as fresh fruits and vegetables, whole grains, and beans. ? Limit foods that are high in fat and processed sugars, such as fried and sweet foods.  Keep all follow-up visits as told by your health care provider.   This is important. Contact a health care provider if:  You have chills or a fever.  You have redness, swelling, or pain around an incision.  You have fluid or blood coming from an incision.  Your incision feels warm to the touch.  You have pus or a bad smell coming from an incision.  An incision breaks open.  You feel dizzy or light-headed.  You have pain or bleeding when you urinate.  You have diarrhea, nausea, or vomiting that does not  go away.  You have abnormal vaginal discharge.  You have a rash.  You have pain that does not get better with medicine. Get help right away if:  You have a fever and your symptoms suddenly get worse.  You have severe abdominal pain.  You have chest pain.  You have shortness of breath.  You faint.  You have pain, swelling, or redness on your leg.  You have heavy vaginal bleeding with blood clots. Summary  After the procedure it is common to have abdominal pain. Your provider will give you medication for this.  Do not take baths, swim, or use a hot tub until your health care provider approves.  Do not lift anything that is heavier than 10 lbs (4.5 kg) for one month after surgery, or as long as told by your health care provider.  Notify your provider if you have any signs or symptoms of infection after the procedure. This information is not intended to replace advice given to you by your health care provider. Make sure you discuss any questions you have with your health care provider. Document Revised: 06/10/2017 Document Reviewed: 09/08/2016 Elsevier Patient Education  2020 [employer]    AMBULATORY SURGERY  DISCHARGE INSTRUCTIONS   1) The drugs that you were given will stay in your system until tomorrow so for the next 24 hours you should not:  A) Drive an automobile B) Make any legal decisions C) Drink any alcoholic beverage   2) You may resume regular meals tomorrow.  Today it is better to start with liquids and gradually work up to solid foods.  You may eat anything you prefer, but it is better to start with liquids, then soup and crackers, and gradually work up to solid foods.   3) Please notify your doctor immediately if you have any unusual bleeding, trouble breathing, redness and pain at the surgery site, drainage, fever, or pain not relieved by medication.    4) Additional Instructions:        Please contact your physician with any problems  or Same Day Surgery at [phone], Monday through Friday 6 am to 4 pm, or [facility] at [place] Main number at [phone]. 

## 2020-02-20 NOTE — Anesthesia Procedure Notes (Signed)
Procedure Name: Intubation Date/Time: 02/20/2020 7:36 AM Performed by: Frazier, Susan, CRNA Pre-anesthesia Checklist: Patient identified, Emergency Drugs available, Suction available and Patient being monitored Patient Re-evaluated:Patient Re-evaluated prior to induction Oxygen Delivery Method: Circle system utilized Preoxygenation: Pre-oxygenation with 100% oxygen Induction Type: IV induction Ventilation: Mask ventilation without difficulty Laryngoscope Size: McGraph and 3 Grade View: Grade I Tube type: Oral Tube size: 7.5 mm Number of attempts: 1 Airway Equipment and Method: Stylet,  Video-laryngoscopy and LTA kit utilized Placement Confirmation: ETT inserted through vocal cords under direct vision,  positive ETCO2 and breath sounds checked- equal and bilateral Secured at: 21 cm Tube secured with: Tape Dental Injury: Teeth and Oropharynx as per pre-operative assessment        

## 2020-02-22 LAB — CYTOLOGY - NON PAP

## 2020-02-25 ENCOUNTER — Telehealth: Payer: Self-pay

## 2020-02-25 NOTE — Telephone Encounter (Signed)
Call placed to check on Susan Strickland post operative. She is doing well. Pathology reviewed with her. She will keep her previously scheduled appointments for post op evaluations.

## 2020-02-26 ENCOUNTER — Other Ambulatory Visit: Payer: Self-pay

## 2020-02-26 ENCOUNTER — Ambulatory Visit: Payer: BC Managed Care – PPO | Admitting: Obstetrics and Gynecology

## 2020-02-28 LAB — SURGICAL PATHOLOGY

## 2020-03-03 ENCOUNTER — Encounter: Payer: Self-pay | Admitting: Obstetrics & Gynecology

## 2020-03-03 ENCOUNTER — Ambulatory Visit (INDEPENDENT_AMBULATORY_CARE_PROVIDER_SITE_OTHER): Payer: BC Managed Care – PPO | Admitting: Obstetrics & Gynecology

## 2020-03-03 ENCOUNTER — Other Ambulatory Visit: Payer: Self-pay

## 2020-03-03 VITALS — BP 120/80 | Ht 60.0 in | Wt 212.0 lb

## 2020-03-03 DIAGNOSIS — Z90710 Acquired absence of both cervix and uterus: Secondary | ICD-10-CM | POA: Insufficient documentation

## 2020-03-03 DIAGNOSIS — Z780 Asymptomatic menopausal state: Secondary | ICD-10-CM

## 2020-03-03 DIAGNOSIS — C541 Malignant neoplasm of endometrium: Secondary | ICD-10-CM

## 2020-03-03 NOTE — Patient Instructions (Signed)
Thank you for choosing [facility]. As part of our ongoing efforts to improve patient experience, we would appreciate your feedback. Please fill out the short survey that you will receive by mail or MyChart. Your opinion is important to us! -Dr Welden Hausmann  

## 2020-03-03 NOTE — Progress Notes (Signed)
  Postoperative Follow-up Patient presents post op from Robotic (lap) hyst w BSO and LN sampling (w Dr Theora Gianotti) for endometrial cancer, 2 weeks ago.  Path: DIAGNOSIS:  A. SENTINEL LYMPH NODE, RIGHT OBTURATOR; EXCISIONAL BIOPSY:  - ONE LYMPH NODE, NEGATIVE FOR MALIGNANCY (0/1).   B. SENTINEL LYMPH NODE, LEFT OBTURATOR; EXCISIONAL BIOPSY:  - ONE LYMPH NODE, NEGATIVE FOR MALIGNANCY (0/1).   C. UTERUS AND CERVIX WITH BILATERAL FALLOPIAN TUBES AND OVARIES; TOTAL  HYSTERECTOMY WITH BILATERAL SALPINGO-OOPHORECTOMY:  - ENDOMETRIAL ADENOCARCINOMA, ENDOMETRIOID TYPE, FIGO GRADE I.  - SEE CANCER SUMMARY BELOW.  - BILATERAL OVARIES WITH HEMORRHAGIC CORPUS LUTEUM CYSTS.  - BILATERAL FALLOPIAN TUBES WITH NO SIGNIFICANT PATHOLOGIC ALTERATION.   CANCER CASE SUMMARY: ENDOMETRIUM  Procedure: Total hysterectomy bilateral salpingo-oophorectomy  Tumor Size: Greatest dimension: 0.6 cm  Histologic Type: Endometrioid carcinoma, NOS  Histologic Grade: FIGO grade 1  Myometrial Invasion: Not identified  Uterine Serosa Involvement: Not identified  Cervical Stromal Involvement: Not identified  Other Tissue/Organ Involvement: Not identified  Margins: All margins negative for invasive carcinoma  Lymphovascular Invasion: Not identified  Regional Lymph Nodes: All regional lymph nodes negative for tumor cells  Lymph nodes examined: 2  Number of pelvic sentinel nodes examined: 2  Pathologic Stage Classification (pTNM, AJCC 8th Edition): pT1a pN0 /  FIGO IA   Subjective: Patient reports some improvement in her preop symptoms. Eating a regular diet without difficulty. The patient is not having any pain.  Activity: normal activities of daily living. Patient reports additional symptom's since surgery of no bleeding.  Objective: LMP 02/12/2020  Physical Exam Constitutional:      General: She is not in acute distress.    Appearance: She is well-developed.  Cardiovascular:     Rate and Rhythm: Normal rate.    Pulmonary:     Effort: Pulmonary effort is normal.  Abdominal:     General: There is no distension.     Palpations: Abdomen is soft.     Tenderness: There is no abdominal tenderness.     Comments: Incision Healing Well   Musculoskeletal:        General: Normal range of motion.  Neurological:     Mental Status: She is alert and oriented to person, place, and time.     Cranial Nerves: No cranial nerve deficit.  Skin:    General: Skin is warm and dry.    Assessment:   ICD-10-CM   1. S/P laparoscopic hysterectomy  Z90.710   2. Endometrial adenocarcinoma ([place])  C54.1   3. Menopause  Z78.0     s/p :  Robotic Lap Hyst/ BSO/ LN sampling progressing well  Plan: Patient has done well after surgery with no apparent complications.  I have discussed the post-operative course to date, and the expected progress moving forward.  The patient understands what complications to be concerned about.  I will see the patient in routine follow up, or sooner if needed.    Activity plan: No heavy lifting.Marland Kitchen  Pelvic rest.  Discussed menopause sx's, she has some insomnia and mood changes.  Options for tx if persists discussed.  Would start w non-hormonal medicine first.  Hoyt Koch 03/03/2020, 7:44 AM

## 2020-03-18 NOTE — Progress Notes (Signed)
03/19/2020 12:45 PM   Susan Strickland 04-15-1966 [id number]  Referring provider: Leonel Ramsay, MD [place],  [place] [address] Chief Complaint  Patient presents with  . Urinary Incontinence    HPI: Susan Strickland is a 54 y.o. female who returns for a 1 month follow up of mixed urinary incontinence, OAB, and endometrioid carcinoma.   Patient has a recent diagnosis of endometrioid carcinoma. Pathology on 01/18/2020 showed focal endometrioid carcinoma, FIGO grade 1, arising in complex atypical hyperplasia. Patient has an upcoming hysterectomy with Dr. Theora Gianotti on 02/20/2020.   She had 3 vaginal births, her last child was 10 lbs. She did not notice any stress incontinence after giving birth. She has noted ongoing urinary stress incontinence x 3 years.  This is with movement, laughing coughing sneezing, etc.  Cytology on 02/20/2020 was negative for malignancy. Reactive mesothelial cells and mixed inflammatory cells.   Patient underwent robotic assisted total hysterectomy and bilateral salpingo-oophorectomy with sentinel node injection, mapping and biopsies with Dr. Theora Gianotti on 02/20/2020. Pathology noted one lymph node, negative for malignancy. Endometrial adenocarcinoma, endometrioid type, FIGO grade 1. Bilateral ovaries with hemorrhagic corpus luteum cysts. Bilateral fallopian tubes with no significant pathologic alteration.   She is doing well s/p surgery. She feels tired. She had symptomatic improvement with low dose Myrbetriq 25 mg. She is emptying her bladder well. PVR is 0 mL today. She notes a good quality of life with her current symptoms.   PMH: Past Medical History:  Diagnosis Date  . Anemia   . Anxiety   . Breast cyst   . Degenerative disc disease, lumbar   . Depression   . Endometrial cancer ([place])   . History of mammogram 05/2012; 09/24/2014   wnl per pt; benign  . History of Papanicolaou smear of cervix 08/15/2012   -/-  . Hyperlipemia   .  Vitamin D deficiency     Surgical History: Past Surgical History:  Procedure Laterality Date  . BACK SURGERY  2015   L5  . BREAST BIOPSY Left 2016   NEG  . COLONOSCOPY WITH PROPOFOL N/A 04/27/2018   Procedure: COLONOSCOPY WITH BIOPSIES;  Surgeon: Lucilla Lame, MD;  Location: [facility];  Service: Endoscopy;  Laterality: N/A;  . ESOPHAGOGASTRODUODENOSCOPY (EGD) WITH PROPOFOL N/A 04/27/2018   Procedure: ESOPHAGOGASTRODUODENOSCOPY (EGD) WITH BIOPSIES;  Surgeon: Lucilla Lame, MD;  Location: [facility];  Service: Endoscopy;  Laterality: N/A;  . ESOPHAGOGASTRODUODENOSCOPY (EGD) WITH PROPOFOL N/A 06/29/2019   Procedure: ESOPHAGOGASTRODUODENOSCOPY (EGD) WITH PROPOFOL;  Surgeon: Lucilla Lame, MD;  Location: [facility];  Service: Endoscopy;  Laterality: N/A;  . 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   Marland Kitchen POLYPECTOMY N/A 04/27/2018   Procedure: POLYPECTOMY;  Surgeon: Lucilla Lame, MD;  Location: [facility];  Service: Endoscopy;  Laterality: N/A;  . ROBOTIC ASSISTED TOTAL HYSTERECTOMY WITH BILATERAL SALPINGO OOPHERECTOMY N/A 02/20/2020   Procedure: XI ROBOTIC ASSISTED TOTAL HYSTERECTOMY WITH BILATERAL SALPINGO OOPHORECTOMY;  Surgeon: Gillis Ends, MD;  Location: [facility];  Service: Gynecology;  Laterality: N/A;  . SENTINEL NODE BIOPSY N/A 02/20/2020   Procedure: SENTINEL NODE DISSECTION;  Surgeon: Gillis Ends, MD;  Location: [facility];  Service: Gynecology;  Laterality: N/A;  . TOTAL VAGINAL HYSTERECTOMY      Home Medications:  Allergies as of 03/19/2020      Reactions   Lac Bovis Nausea And Vomiting       Doxycycline Nausea Only   Hot flashes, vivid dreams   Milk-related  Compounds Nausea And Vomiting       Sweetness Enhancer    headaches   Azithromycin Rash      Medication List       Accurate as of March 19, 2020 11:59 PM. If you have any questions, ask your nurse or doctor.        STOP taking these medications     HYDROcodone-acetaminophen 5-325 MG tablet Commonly known as: NORCO/VICODIN Stopped by: Hollice Espy, MD   ibuprofen 800 MG tablet Commonly known as: ADVIL Stopped by: Hollice Espy, MD     TAKE these medications   acetaminophen 325 MG tablet Commonly known as: TYLENOL Take 650 mg by mouth every 6 (six) hours as needed for moderate pain.   cyclobenzaprine 5 MG tablet Commonly known as: FLEXERIL Take 1 tablet (5 mg total) by mouth 3 (three) times daily as needed for muscle spasms.   ferrous sulfate 325 (65 FE) MG tablet Take 325 mg by mouth every evening.   fluticasone 50 MCG/ACT nasal spray Commonly known as: FLONASE Place 1 spray into both nostrils 2 (two) times daily.   mirabegron ER 25 MG Tb24 tablet Commonly known as: MYRBETRIQ Take 1 tablet (25 mg total) by mouth daily. Started by: Hollice Espy, MD   sertraline 100 MG tablet Commonly known as: ZOLOFT TAKE 1 TABLET BY MOUTH EVERY DAY What changed:   how much to take  when to take this   vitamin B-12 500 MCG tablet Commonly known as: CYANOCOBALAMIN Take 500 mcg by mouth every evening.   vitamin C 1000 MG tablet Take 1,000 mg by mouth every evening.   Vitamin D-1000 Max St 25 MCG (1000 UT) tablet Generic drug: Cholecalciferol Take 1,000 Units by mouth every evening.   zinc gluconate 50 MG tablet Take 50 mg by mouth every evening.       Allergies:  Allergies  Allergen Reactions  . Lac Bovis Nausea And Vomiting       . Doxycycline Nausea Only    Hot flashes, vivid dreams  . Milk-Related Compounds Nausea And Vomiting       . Sweetness Enhancer     headaches  . Azithromycin Rash    Family History: Family History  Problem Relation Age of Onset  . Breast cancer Mother 57  . Hypertension Father   . Kidney disease Father   . Diabetes Father        type 2  . Heart disease Father     Social History:  reports that she has never smoked. She has never used smokeless tobacco. She reports that  she does not drink alcohol and does not use drugs.   Physical Exam: BP 127/82   Pulse 88   Constitutional:  Alert and oriented, No acute distress. HEENT: [place] AT, moist mucus membranes.  Trachea midline, no masses. Cardiovascular: No clubbing, cyanosis, or edema. Respiratory: Normal respiratory effort, no increased work of breathing. Skin: No rashes, bruises or suspicious lesions. Neurologic: Grossly intact, no focal deficits, moving all 4 extremities. Psychiatric: Normal mood and affect.  Laboratory Data:  Lab Results  Component Value Date   CREATININE 0.79 02/18/2020    Lab Results  Component Value Date   HGBA1C 5.3 02/18/2020     Pertinent Imaging: Results for orders placed or performed in visit on 03/19/20  BLADDER SCAN AMB NON-IMAGING  Result Value Ref Range   Scan Result 0 ML      Assessment & Plan:    1. Mixed urinary incontinence, stress and  urge incontinence/OAB Symptoms dramatically iwth improved with Myrbetriq. PVR is 0 mL. Continues low dose Myrbetriq 25 mg   2. Endometrioid carcinoma  Newly diagnosed in 01/2020. Underwent cervical biopsy on 01/30/2020. Pathology revealed tiny fragments of squamous epithelium with acute inflammation and reactive changes. Minute strips of benign endocervical epithelium. Deeper sections were examined. Negative for malignancy.  S/p hysterectomy with Dr. Theora Gianotti on 02/20/2020.  RTC in 1 year.   [place] [address], [place], [place] [address] [phone]  I, Selena Batten, am acting as a scribe for Dr. Hollice Espy.  I have reviewed the above documentation for accuracy and completeness, and I agree with the above.   Hollice Espy, MD

## 2020-03-19 ENCOUNTER — Ambulatory Visit (INDEPENDENT_AMBULATORY_CARE_PROVIDER_SITE_OTHER): Payer: BC Managed Care – PPO | Admitting: Urology

## 2020-03-19 ENCOUNTER — Encounter: Payer: Self-pay | Admitting: Urology

## 2020-03-19 ENCOUNTER — Other Ambulatory Visit: Payer: Self-pay

## 2020-03-19 VITALS — BP 127/82 | HR 88

## 2020-03-19 DIAGNOSIS — R32 Unspecified urinary incontinence: Secondary | ICD-10-CM | POA: Diagnosis not present

## 2020-03-19 LAB — BLADDER SCAN AMB NON-IMAGING: Scan Result: 0

## 2020-03-19 MED ORDER — MIRABEGRON ER 25 MG PO TB24
25.0000 mg | ORAL_TABLET | Freq: Every day | ORAL | 4 refills | Status: DC
Start: 2020-03-19 — End: 2021-10-28

## 2020-03-20 ENCOUNTER — Encounter: Payer: Self-pay | Admitting: Obstetrics and Gynecology

## 2020-04-02 ENCOUNTER — Other Ambulatory Visit: Payer: Self-pay

## 2020-04-02 ENCOUNTER — Inpatient Hospital Stay: Payer: BC Managed Care – PPO | Attending: Obstetrics and Gynecology | Admitting: Obstetrics and Gynecology

## 2020-04-02 VITALS — BP 123/86 | HR 84 | Temp 97.6°F | Resp 16 | Wt 206.8 lb

## 2020-04-02 DIAGNOSIS — C541 Malignant neoplasm of endometrium: Secondary | ICD-10-CM

## 2020-04-02 MED ORDER — IBUPROFEN 800 MG PO TABS: 800.0000 mg | ORAL_TABLET | Freq: Three times a day (TID) | ORAL | 0 refills | Status: AC | PRN

## 2020-04-02 NOTE — Progress Notes (Signed)
Gynecologic Oncology Consult Visit   Referring Provider: Ardeth Perfect, PA  Chief Complaint: Endometrioid Carcinoma  Subjective:  Susan Strickland is a 54 y.o. female who is seen in consultation from [facility] [place] for endometrioid carcinoma.   DIAGNOSIS:  A. SENTINEL LYMPH NODE, RIGHT OBTURATOR; EXCISIONAL BIOPSY:  - ONE LYMPH NODE, NEGATIVE FOR MALIGNANCY (0/1).   B. SENTINEL LYMPH NODE, LEFT OBTURATOR; EXCISIONAL BIOPSY:  - ONE LYMPH NODE, NEGATIVE FOR MALIGNANCY (0/1).   C. UTERUS AND CERVIX WITH BILATERAL FALLOPIAN TUBES AND OVARIES; TOTAL  HYSTERECTOMY WITH BILATERAL SALPINGO-OOPHORECTOMY:  - ENDOMETRIAL ADENOCARCINOMA, ENDOMETRIOID TYPE, FIGO GRADE I.  - SEE CANCER SUMMARY BELOW.  - BILATERAL OVARIES WITH HEMORRHAGIC CORPUS LUTEUM CYSTS.  - BILATERAL FALLOPIAN TUBES WITH NO SIGNIFICANT PATHOLOGIC ALTERATION.   CANCER CASE SUMMARY: ENDOMETRIUM  Procedure: Total hysterectomy bilateral salpingo-oophorectomy  Tumor Size: Greatest dimension: 0.6 cm  Histologic Type: Endometrioid carcinoma, NOS  Histologic Grade: FIGO grade 1  Myometrial Invasion: Not identified  Uterine Serosa Involvement: Not identified  Cervical Stromal Involvement: Not identified  Other Tissue/Organ Involvement: Not identified  Margins: All margins negative for invasive carcinoma  Lymphovascular Invasion: Not identified  Regional Lymph Nodes: All regional lymph nodes negative for tumor cells  Lymph nodes examined: 2  Number of pelvic sentinel nodes examined: 2  Pathologic Stage Classification (pTNM, AJCC 8th Edition): pT1a pN0 / FIGO IA   Additional findings:    Atypical hyperplasia / endometrial intraepithelial neoplasia (EIN)    Leiomyoma    Adenomyosis    Immunohistochemistry (IHC) Testing for DNA Mismatch Repair (MMR)  Proteins:  Results:  MLH1: Intact nuclear expression  MSH2: Intact nuclear expression  MSH6: Intact nuclear expression  PMS2: Intact nuclear expression    Cytology: negative  She saw Dr. Erlene Quan, Urology and was started on Myrbetriq 25 mg for mixed urinary incontinence, OAB and is doing very well on this medication.   She did have vasomotor symptoms after surgery but they have improved and are tolerable.  Gynecologic Oncology History:  She initially presented for AUB s/p endometrial biopsy on 01/18/20.   A. ENDOMETRIUM, BIOPSY:  - Focal endometrioid carcinoma, FIGO grade 1, arising in complex atypical hyperplasia.   Cervical biopsy 01/30/2020 DIAGNOSIS:  A. CERVIX; BIOPSY:  - TINY FRAGMENTS OF SQUAMOUS EPITHELIUM WITH ACUTE INFLAMMATION AND  REACTIVE CHANGES.  - MINUTE STRIPS OF BENIGN ENDOCERVICAL EPITHELIUM.  - DEEPER SECTIONS WERE EXAMINED.  - NEGATIVE FOR MALIGNANCY.   She opted for surgical management  Problem List: Patient Active Problem List   Diagnosis Date Noted   S/P laparoscopic hysterectomy 03/03/2020   Menopause 03/03/2020   Endometrial cancer ([place]) 01/30/2020   Urinary incontinence 01/30/2020   Breast mass, right 12/11/2019   Abdominal pain    BMI 40.0-44.9, adult ([place]) 02/19/2019   Encounter for screening mammogram for breast cancer    Acute gastritis without hemorrhage    S/P bariatric surgery 04/25/2017   Vitamin B12 nutritional deficiency 04/25/2017   Obesity (BMI 30-39.9) 04/25/2017   Normocytic anemia 04/25/2017   Hyperlipidemia 04/25/2017   Abnormal mammogram 02/01/2017   PMDD (premenstrual dysphoric disorder) 10/21/2015   Vitamin D deficiency 10/21/2015   Degeneration of intervertebral disc of lumbar region 12/23/2014   Calculus of kidney 04/29/2014   L-S radiculopathy 11/04/2011    Past Medical History: Past Medical History:  Diagnosis Date   Anemia    Anxiety    Breast cyst    Degenerative disc disease, lumbar    Depression    Endometrial cancer ([place])  History of mammogram 05/2012; 09/24/2014   wnl per pt; benign   History of Papanicolaou smear of cervix  08/15/2012   -/-   Hyperlipemia    Vitamin D deficiency     Past Surgical History: Past Surgical History:  Procedure Laterality Date   BACK SURGERY  2015   L5   BREAST BIOPSY Left 2016   NEG   COLONOSCOPY WITH PROPOFOL N/A 04/27/2018   Procedure: COLONOSCOPY WITH BIOPSIES;  Surgeon: Lucilla Lame, MD;  Location: [facility];  Service: Endoscopy;  Laterality: N/A;   ESOPHAGOGASTRODUODENOSCOPY (EGD) WITH PROPOFOL N/A 04/27/2018   Procedure: ESOPHAGOGASTRODUODENOSCOPY (EGD) WITH BIOPSIES;  Surgeon: Lucilla Lame, MD;  Location: [facility];  Service: Endoscopy;  Laterality: N/A;   ESOPHAGOGASTRODUODENOSCOPY (EGD) WITH PROPOFOL N/A 06/29/2019   Procedure: ESOPHAGOGASTRODUODENOSCOPY (EGD) WITH PROPOFOL;  Surgeon: Lucilla Lame, MD;  Location: [facility];  Service: Endoscopy;  Laterality: N/A;   GASTRIC BYPASS  03/2014   gastric sleeve.    POLYPECTOMY N/A 04/27/2018   Procedure: POLYPECTOMY;  Surgeon: Lucilla Lame, MD;  Location: [facility];  Service: Endoscopy;  Laterality: N/A;   ROBOTIC ASSISTED TOTAL HYSTERECTOMY WITH BILATERAL SALPINGO OOPHERECTOMY N/A 02/20/2020   Procedure: XI ROBOTIC ASSISTED TOTAL HYSTERECTOMY WITH BILATERAL SALPINGO OOPHORECTOMY;  Surgeon: Gillis Ends, MD;  Location: [facility];  Service: Gynecology;  Laterality: N/A;   SENTINEL NODE BIOPSY N/A 02/20/2020   Procedure: SENTINEL NODE DISSECTION;  Surgeon: Gillis Ends, MD;  Location: [facility];  Service: Gynecology;  Laterality: N/A;   TOTAL VAGINAL HYSTERECTOMY      Past Gynecologic History:  Menarche: age 44 Pain with menses Last Menstrual Period:   OB History:  OB History  Gravida Para Term Preterm AB Living  _0 SAB TAB Ectopic Multiple Live Births  1       3    # Outcome Date GA Lbr Len/2nd Weight Sex Delivery Anes PTL Lv  4 Term 07/07/92          3 Term 04/21/89          2 SAB 07/13/87          1 Term 11/20/85            Obstetric  Comments  1st Menstrual Cycle:  14  1st Pregnancy:  19    Family History: h/o VTE in her daughter associated with leg fracture and immobility.  Family History  Problem Relation Age of Onset   Breast cancer Mother 80   Hypertension Father    Kidney disease Father    Diabetes Father        type 2   Heart disease Father     Social History: Social History   Socioeconomic History   Marital status: Married    Spouse name: Not on file   Number of children: Not on file   Years of education: 16   Highest education level: Not on file  Occupational History   Occupation: Pharmacist, hospital  Tobacco Use   Smoking status: Never Smoker   Smokeless tobacco: Never Used  Scientific laboratory technician Use: Never used  Substance and Sexual Activity   Alcohol use: No   Drug use: No   Sexual activity: Not Currently    Birth control/protection: Surgical, Other-see comments    Comment: vasectomy  Other Topics Concern   Not on file  Social History Narrative   Not on file   Social Determinants of Health   Financial  Resource Strain:    Difficulty of Paying Living Expenses: Not on file  Food Insecurity:    Worried About [place] in the Last Year: Not on file   Ran Out of Food in the Last Year: Not on file  Transportation Needs:    Lack of Transportation (Medical): Not on file   Lack of Transportation (Non-Medical): Not on file  Physical Activity:    Days of Exercise per Week: Not on file   Minutes of Exercise per Session: Not on file  Stress:    Feeling of Stress : Not on file  Social Connections:    Frequency of Communication with Friends and Family: Not on file   Frequency of Social Gatherings with Friends and Family: Not on file   Attends Religious Services: Not on file   Active Member of Clubs or Organizations: Not on file   Attends Archivist Meetings: Not on file   Marital Status: Not on file  Intimate Partner Violence:    Fear of Current  or Ex-Partner: Not on file   Emotionally Abused: Not on file   Physically Abused: Not on file   Sexually Abused: Not on file    Allergies: Allergies  Allergen Reactions   Lac Bovis Nausea And Vomiting        Doxycycline Nausea Only    Hot flashes, vivid dreams   Milk-Related Compounds Nausea And Vomiting        Sweetness Enhancer     headaches   Azithromycin Rash    Current Medications: Current Outpatient Medications  Medication Sig Dispense Refill   acetaminophen (TYLENOL) 325 MG tablet Take 650 mg by mouth every 6 (six) hours as needed for moderate pain.     Ascorbic Acid (VITAMIN C) 1000 MG tablet Take 1,000 mg by mouth every evening.      Cholecalciferol (VITAMIN D-1000 MAX ST) 1000 units tablet Take 1,000 Units by mouth every evening.      cyclobenzaprine (FLEXERIL) 5 MG tablet Take 1 tablet (5 mg total) by mouth 3 (three) times daily as needed for muscle spasms. 30 tablet 0   ferrous sulfate 325 (65 FE) MG tablet Take 325 mg by mouth every evening.      fluticasone (FLONASE) 50 MCG/ACT nasal spray Place 1 spray into both nostrils 2 (two) times daily. 16 g 6   mirabegron ER (MYRBETRIQ) 25 MG TB24 tablet Take 1 tablet (25 mg total) by mouth daily. 90 tablet 4   sertraline (ZOLOFT) 100 MG tablet TAKE 1 TABLET BY MOUTH EVERY DAY (Patient taking differently: Take 150 mg by mouth at bedtime. ) 90 tablet 1   vitamin B-12 (CYANOCOBALAMIN) 500 MCG tablet Take 500 mcg by mouth every evening.      zinc gluconate 50 MG tablet Take 50 mg by mouth every evening.      No current facility-administered medications for this visit.   Review of Systems General: weakness  HEENT: no complaints  Lungs: no complaints  Cardiac: no complaints  GI: no complaints  GU: no complaints  Musculoskeletal: no complaints  Extremities: no complaints  Skin: no complaints  Neuro: no complaints  Endocrine: no complaints  Psych: no complaints        Objective:  Physical  Examination:  Body mass index is 40.39 kg/m. BP 123/86    Pulse 84    Temp 97.6 F (36.4 C) (Tympanic)    Resp 16    Wt 206 lb 12.8 oz (93.8 kg)  SpO2 97%    BMI 40.39 kg/m     ECOG Performance Status: 0 - Asymptomatic  GENERAL: Patient is a well appearing female in no acute distress HEENT:  PERRL, neck supple with midline trachea. Thyroid without masses.  ABDOMEN:  Soft, nontender.  No masses or ascites EXTREMITIES:  No peripheral edema.   NEURO:  Nonfocal. Well oriented.  Appropriate affect.  Pelvic: EGBUS: no lesions Cervix: surgically absent Vagina: no lesions, no discharge or bleeding. Cuff intact with sutures. No erythema. Uterus: surgically absent BME: no palpable masses; cuff intact Rectovaginal: deferred    Lab Review Labs on site today: Lab Results  Component Value Date   WBC 6.0 02/18/2020   HGB 14.2 02/18/2020   HCT 42.2 02/18/2020   MCV 93.0 02/18/2020   PLT 237 02/18/2020   Lab Results  Component Value Date   NA 137 02/18/2020   K 4.1 02/18/2020   CL 104 02/18/2020   CO2 25 02/18/2020     Radiologic Imaging: n/a    Assessment:  EDRA RICCARDI is a 54 y.o. female diagnosed with grade 1 endometrioid endometrial cancer.   Urinary incontinence, suspect stress incontinence, controlled with Myrbetriq.   O negative blood type  Menopausal symptoms, tolerable.  Medical co-morbidities complicating care: prior surgery. Body mass index is 40.39 kg/m.  Plan:   Problem List Items Addressed This Visit      Genitourinary   Endometrial cancer ([place]) - Primary     We discussed her pathology results. She has an excellent prognosis with low risk of recurrence.  I have recommended continued close follow up with exams, including pelvic exams every 6 months for 5 years and then annually thereafter. She would like to follow up with Dr. Kenton Kingfisher. Patient education for obesity, lifestyle, exercise, nutrition, sexual health, vaginal lubricants. We discussed  her weight and need for weight loss, stressed good nutrition, and exercise.   She requested ibuprofen 861m for pain control. We counseled her about risk of gastritis, PUD, renal and  Insufficency. We recommended not to use this medication for long term. If she needs NSAIDs for pain related issue beyond this month to follow up with her PCP.   She will follow up with Dr BErlene Quanregarding Urology issues.   [name] diagnosis, an outline of the further diagnostic and laboratory studies which will be required, the recommendation for surgery, and alternatives were discussed with her and her accompanying family members.  All questions were answered to their satisfaction.    Robecca Fulgham AGaetana Michaelis MD   CC;  Dr. PBarnett Applebaum

## 2020-04-20 ENCOUNTER — Encounter: Payer: Self-pay | Admitting: Obstetrics and Gynecology

## 2020-04-24 ENCOUNTER — Inpatient Hospital Stay: Payer: BC Managed Care – PPO | Attending: Nurse Practitioner | Admitting: Nurse Practitioner

## 2020-04-24 ENCOUNTER — Other Ambulatory Visit: Payer: Self-pay

## 2020-04-24 VITALS — BP 130/87 | HR 82 | Temp 97.7°F | Resp 16 | Wt 204.0 lb

## 2020-04-24 DIAGNOSIS — G8918 Other acute postprocedural pain: Secondary | ICD-10-CM | POA: Diagnosis not present

## 2020-04-24 DIAGNOSIS — C541 Malignant neoplasm of endometrium: Secondary | ICD-10-CM | POA: Diagnosis not present

## 2020-04-24 DIAGNOSIS — Z90722 Acquired absence of ovaries, bilateral: Secondary | ICD-10-CM | POA: Insufficient documentation

## 2020-04-24 NOTE — Progress Notes (Signed)
GYN pt here for symptom management today for Pelvic, inguinal pain. Had hysterectomy 8 weeks ago. She returned to work 2 weeks ago. 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 Pain started last Weds, it came on sharp (cramping) and fast while driving. She could barely walk into her house. Severe Pain lasted about 24 hours; since then it waxes and wanes. She has tried not to lift all those books at one time and not exert herself. No pain at present time.

## 2020-04-24 NOTE — Progress Notes (Signed)
[facility]pt[facility] [place]  [phone] [phone]  Patient Care Team: Leonel Ramsay, MD as PCP - General (Infectious Diseases) Copland, Deirdre Evener, PA-C as Referring Physician (Obstetrics and Gynecology) Christene Lye, MD (General Surgery) Clent Jacks, RN as Oncology Nurse Navigator   Name of the patient: Susan Strickland  [id number]  1965/07/22   Date of visit: 04/24/20  Diagnosis-stage I endometrioid endometrial cancer  Chief complaint/ Reason for visit-left lower abdominal pain  Heme/Onc history:   She initially presented for AUB s/p endometrial biopsy on 01/18/20.   A. ENDOMETRIUM, BIOPSY:  - Focal endometrioid carcinoma, FIGO grade 1, arising in complex atypical hyperplasia.   Cervical biopsy 01/30/2020 DIAGNOSIS:  A. CERVIX; BIOPSY:  - TINY FRAGMENTS OF SQUAMOUS EPITHELIUM WITH ACUTE INFLAMMATION AND  REACTIVE CHANGES.  - MINUTE STRIPS OF BENIGN ENDOCERVICAL EPITHELIUM.  - DEEPER SECTIONS WERE EXAMINED.  - NEGATIVE FOR MALIGNANCY.   She opted for surgical management  DIAGNOSIS:  A. SENTINEL LYMPH NODE, RIGHT OBTURATOR; EXCISIONAL BIOPSY:  - ONE LYMPH NODE, NEGATIVE FOR MALIGNANCY (0/1).   B. SENTINEL LYMPH NODE, LEFT OBTURATOR; EXCISIONAL BIOPSY:  - ONE LYMPH NODE, NEGATIVE FOR MALIGNANCY (0/1).   C. UTERUS AND CERVIX WITH BILATERAL FALLOPIAN TUBES AND OVARIES; TOTAL  HYSTERECTOMY WITH BILATERAL SALPINGO-OOPHORECTOMY:  - ENDOMETRIAL ADENOCARCINOMA, ENDOMETRIOID TYPE, FIGO GRADE I.  - SEE CANCER SUMMARY BELOW.  - BILATERAL OVARIES WITH HEMORRHAGIC CORPUS LUTEUM CYSTS.  - BILATERAL FALLOPIAN TUBES WITH NO SIGNIFICANT PATHOLOGIC ALTERATION.   CANCER CASE SUMMARY: ENDOMETRIUM  Procedure: Total hysterectomy bilateral salpingo-oophorectomy  Tumor Size: Greatest dimension: 0.6 cm  Histologic Type: Endometrioid carcinoma, NOS  Histologic Grade: FIGO grade 1  Myometrial Invasion: Not  identified  Uterine Serosa Involvement: Not identified  Cervical Stromal Involvement: Not identified  Other Tissue/Organ Involvement: Not identified  Margins: All margins negative for invasive carcinoma  Lymphovascular Invasion: Not identified  Regional Lymph Nodes: All regional lymph nodes negative for tumor cells  Lymph nodes examined: 2  Number of pelvic sentinel nodes examined: 2  Pathologic Stage Classification (pTNM, AJCC 8th Edition): pT1a pN0 / FIGO IA   Additional findings:    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   Oncology History   No history exists.    Interval history-patient is 54 year old female who presents to symptom management clinic for complaints of left lower quadrant abdominal pain that started 2 days ago while driving. She complains of sudden onset left lower quadrant pain that radiated across her lower abdomen. Described as severe. Pain has gradually improved and localized to left lower quadrant. Worse with exertion. She has now returned to work over the past 2 weeks. Has taken ibuprofen 800 mg intermittently for pain with some improvement. Has tried an abdominal binder which improves her comfort.   Review of systems- Review of Systems  Constitutional: Negative for chills, fever, malaise/fatigue and weight loss.  HENT: Negative for hearing loss, nosebleeds, sore throat and tinnitus.   Eyes: Negative for blurred vision and double vision.  Respiratory: Negative for cough, hemoptysis, shortness of breath and wheezing.   Cardiovascular: Negative for chest pain, palpitations and leg swelling.  Gastrointestinal:  Positive for abdominal pain. Negative for  blood in stool, constipation, diarrhea, melena, nausea and vomiting.  Genitourinary: Negative for dysuria and urgency.  Musculoskeletal: Negative for back pain, falls, joint pain and myalgias.  Skin: Negative for itching and rash.  Neurological: Negative for dizziness, tingling, sensory change, loss of consciousness, weakness and headaches.  Endo/Heme/Allergies: Negative for environmental allergies. Does not bruise/bleed easily.  Psychiatric/Behavioral: Negative for depression. The patient is not nervous/anxious and does not have insomnia.     Current treatment- surveillance  Allergies  Allergen Reactions  . Lac Bovis Nausea And Vomiting       . Doxycycline Nausea Only    Hot flashes, vivid dreams  . Milk-Related Compounds Nausea And Vomiting       . Sweetness Enhancer     headaches  . Azithromycin Rash    Past Medical History:  Diagnosis Date  . Anemia   . Anxiety   . Breast cyst   . Degenerative disc disease, lumbar   . Depression   . Endometrial cancer ([place])   . History of mammogram 05/2012; 09/24/2014   wnl per pt; benign  . History of Papanicolaou smear of cervix 08/15/2012   -/-  . Hyperlipemia   . Vitamin D deficiency     Past Surgical History:  Procedure Laterality Date  . BACK SURGERY  2015   L5  . BREAST BIOPSY Left 2016   NEG  . COLONOSCOPY WITH PROPOFOL N/A 04/27/2018   Procedure: COLONOSCOPY WITH BIOPSIES;  Surgeon: Lucilla Lame, MD;  Location: [facility];  Service: Endoscopy;  Laterality: N/A;  . ESOPHAGOGASTRODUODENOSCOPY (EGD) WITH PROPOFOL N/A 04/27/2018   Procedure: ESOPHAGOGASTRODUODENOSCOPY (EGD) WITH BIOPSIES;  Surgeon: Lucilla Lame, MD;  Location: [facility];  Service: Endoscopy;  Laterality: N/A;  . ESOPHAGOGASTRODUODENOSCOPY (EGD) WITH PROPOFOL N/A 06/29/2019   Procedure: ESOPHAGOGASTRODUODENOSCOPY (EGD) WITH PROPOFOL;  Surgeon: Lucilla Lame, MD;  Location: [facility];  Service:  Endoscopy;  Laterality: N/A;  . 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   Marland Kitchen POLYPECTOMY N/A 04/27/2018   Procedure: POLYPECTOMY;  Surgeon: Lucilla Lame, MD;  Location: [facility];  Service: Endoscopy;  Laterality: N/A;  . ROBOTIC ASSISTED TOTAL HYSTERECTOMY WITH BILATERAL SALPINGO OOPHERECTOMY N/A 02/20/2020   Procedure: XI ROBOTIC ASSISTED TOTAL HYSTERECTOMY WITH BILATERAL SALPINGO OOPHORECTOMY;  Surgeon: Gillis Ends, MD;  Location: [facility];  Service: Gynecology;  Laterality: N/A;  . SENTINEL NODE BIOPSY N/A 02/20/2020   Procedure: SENTINEL NODE DISSECTION;  Surgeon: Gillis Ends, MD;  Location: [facility];  Service: Gynecology;  Laterality: N/A;  . TOTAL VAGINAL HYSTERECTOMY      Social History   Socioeconomic History  . Marital status: Married    Spouse name: Not on file  . Number of children: Not on file  . Years of education: 2  . Highest education level: Not on file  Occupational History  . Occupation: Pharmacist, hospital  Tobacco Use  . Smoking status: Never Smoker  . Smokeless tobacco: Never Used  Vaping Use  . Vaping Use: Never used  Substance and Sexual Activity  . Alcohol use: No  . Drug use: No  . Sexual activity: Not Currently    Birth control/protection: Surgical, Other-see comments    Comment: vasectomy  Other Topics Concern  . Not on file  Social History Narrative  . Not on file   Social Determinants of Health   Financial Resource Strain:   . Difficulty of Paying Living Expenses: Not on file  Food Insecurity:   . Worried About Charity fundraiser  in the Last Year: Not on file  . Ran Out of Food in the Last Year: Not on file  Transportation Needs:   . Lack of Transportation (Medical): Not on file  . Lack of Transportation (Non-Medical): Not on file  Physical Activity:   . Days of Exercise per Week: Not on file  . Minutes of Exercise per Session: Not on file  Stress:   . Feeling of Stress : Not on file  Social Connections:   .  Frequency of Communication with Friends and Family: Not on file  . Frequency of Social Gatherings with Friends and Family: Not on file  . Attends Religious Services: Not on file  . Active Member of Clubs or Organizations: Not on file  . Attends Archivist Meetings: Not on file  . Marital Status: Not on file  Intimate Partner Violence:   . Fear of Current or Ex-Partner: Not on file  . Emotionally Abused: Not on file  . Physically Abused: Not on file  . Sexually Abused: Not on file    Family History  Problem Relation Age of Onset  . Breast cancer Mother 67  . Hypertension Father   . Kidney disease Father   . Diabetes Father        type 2  . Heart disease Father      Current Outpatient Medications:  .  acetaminophen (TYLENOL) 325 MG tablet, Take 650 mg by mouth every 6 (six) hours as needed for moderate pain., Disp: , Rfl:  .  Ascorbic Acid (VITAMIN C) 1000 MG tablet, Take 1,000 mg by mouth every evening. , Disp: , Rfl:  .  Cholecalciferol (VITAMIN D-1000 MAX ST) 1000 units tablet, Take 1,000 Units by mouth every evening. , Disp: , Rfl:  .  cyclobenzaprine (FLEXERIL) 5 MG tablet, Take 1 tablet (5 mg total) by mouth 3 (three) times daily as needed for muscle spasms., Disp: 30 tablet, Rfl: 0 .  ferrous sulfate 325 (65 FE) MG tablet, Take 325 mg by mouth every evening. , Disp: , Rfl:  .  fluticasone (FLONASE) 50 MCG/ACT nasal spray, Place 1 spray into both nostrils 2 (two) times daily., Disp: 16 g, Rfl: 6 .  ibuprofen (ADVIL) 800 MG tablet, Take 1 tablet (800 mg total) by mouth every 8 (eight) hours as needed for mild pain or moderate pain (Take with food)., Disp: 90 tablet, Rfl: 0 .  mirabegron ER (MYRBETRIQ) 25 MG TB24 tablet, Take 1 tablet (25 mg total) by mouth daily., Disp: 90 tablet, Rfl: 4 .  sertraline (ZOLOFT) 100 MG tablet, TAKE 1 TABLET BY MOUTH EVERY DAY (Patient taking differently: Take 150 mg by mouth at bedtime. ), Disp: 90 tablet, Rfl: 1 .  vitamin B-12  (CYANOCOBALAMIN) 500 MCG tablet, Take 500 mcg by mouth every evening. , Disp: , Rfl:  .  zinc gluconate 50 MG tablet, Take 50 mg by mouth every evening. , Disp: , Rfl:   Physical exam:  Vitals:   04/24/20 1610  BP: 130/87  Pulse: 82  Resp: 16  Temp: 97.7 F (36.5 C)  TempSrc: Tympanic  Weight: 204 lb (92.5 kg)   Physical Exam Constitutional:      General: She is not in acute distress. HENT:     Head: Normocephalic.  Cardiovascular:     Rate and Rhythm: Normal rate and regular rhythm.  Pulmonary:     Effort: Pulmonary effort is normal. No respiratory distress.  Abdominal:     General: Abdomen is flat. There  is no distension.     Palpations: Abdomen is soft. There is no mass.     Tenderness: There is no abdominal tenderness. There is no guarding or rebound.     Hernia: No hernia is present.     Comments: Well-healed lap sites; pain not reproducible to light or deep palpation  Skin:    General: Skin is warm and dry.  Neurological:     Mental Status: She is alert and oriented to person, place, and time.  Psychiatric:        Mood and Affect: Mood normal.        Behavior: Behavior normal.      CMP Latest Ref Rng & Units 02/18/2020  Glucose 70 - 99 mg/dL 78  BUN 6 - 20 mg/dL 14  Creatinine 0.44 - 1.00 mg/dL 0.79  Sodium 135 - 145 mmol/L 137  Potassium 3.5 - 5.1 mmol/L 4.1  Chloride 98 - 111 mmol/L 104  CO2 22 - 32 mmol/L 25  Calcium 8.9 - 10.3 mg/dL 8.5(L)  Total Protein 6.5 - 8.1 g/dL 7.3  Total Bilirubin 0.3 - 1.2 mg/dL 0.6  Alkaline Phos 38 - 126 U/L 53  AST 15 - 41 U/L 22  ALT 0 - 44 U/L 18   CBC Latest Ref Rng & Units 02/18/2020  WBC 4.0 - 10.5 K/uL 6.0  Hemoglobin 12.0 - 15.0 g/dL 14.2  Hematocrit 36 - 46 % 42.2  Platelets 150 - 400 K/uL 237    No images are attached to the encounter.  No results found.  Assessment and plan- Patient is a 54 y.o. female diagnosed with grade 1 endometrioid endometrial cancer status post TLH BSO with Dr. Theora Gianotti and Dr. Gilman Schmidt  on 02/20/2020 who presents to symptom management clinic for complaints of left lower abdominal pain. Exam reassuring. No acute findings. Pain not reproducible. Suspect exertional pain in postoperative setting. Recommend conservative treatment including Tylenol and ibuprofen. Abdominal binder for comfort. Limit heavy lifting. If pain continues return to clinic for reevaluation.   Patient advised to notify the clinic if there is no improvement in symptoms or if symptoms worsen.    Visit Diagnosis 1. Post-op pain     Patient expressed understanding and was in agreement with this plan. She also understands that She can call clinic at any time with any questions, concerns, or complaints.   Thank you for allowing me to participate in the care of this very pleasant patient.   Beckey Rutter, DNP, AGNP-C [facility]

## 2020-10-01 ENCOUNTER — Ambulatory Visit: Payer: BC Managed Care – PPO

## 2020-10-22 ENCOUNTER — Inpatient Hospital Stay
Payer: Federal, State, Local not specified - PPO | Attending: Obstetrics and Gynecology | Admitting: Obstetrics and Gynecology

## 2020-10-22 ENCOUNTER — Other Ambulatory Visit: Payer: Self-pay

## 2020-10-22 ENCOUNTER — Other Ambulatory Visit: Payer: Self-pay | Admitting: Nurse Practitioner

## 2020-10-22 VITALS — BP 116/73 | HR 76 | Temp 97.8°F | Resp 20 | Wt 207.0 lb

## 2020-10-22 DIAGNOSIS — C541 Malignant neoplasm of endometrium: Secondary | ICD-10-CM

## 2020-10-22 DIAGNOSIS — Z90710 Acquired absence of both cervix and uterus: Secondary | ICD-10-CM | POA: Diagnosis not present

## 2020-10-22 DIAGNOSIS — N3946 Mixed incontinence: Secondary | ICD-10-CM | POA: Insufficient documentation

## 2020-10-22 DIAGNOSIS — N951 Menopausal and female climacteric states: Secondary | ICD-10-CM | POA: Diagnosis not present

## 2020-10-22 DIAGNOSIS — Z90722 Acquired absence of ovaries, bilateral: Secondary | ICD-10-CM | POA: Insufficient documentation

## 2020-10-22 DIAGNOSIS — E785 Hyperlipidemia, unspecified: Secondary | ICD-10-CM | POA: Insufficient documentation

## 2020-10-22 DIAGNOSIS — E559 Vitamin D deficiency, unspecified: Secondary | ICD-10-CM | POA: Insufficient documentation

## 2020-10-22 DIAGNOSIS — Z780 Asymptomatic menopausal state: Secondary | ICD-10-CM | POA: Diagnosis not present

## 2020-10-22 DIAGNOSIS — Z8542 Personal history of malignant neoplasm of other parts of uterus: Secondary | ICD-10-CM | POA: Diagnosis not present

## 2020-10-22 DIAGNOSIS — F32A Depression, unspecified: Secondary | ICD-10-CM | POA: Insufficient documentation

## 2020-10-22 DIAGNOSIS — Z79899 Other long term (current) drug therapy: Secondary | ICD-10-CM | POA: Diagnosis not present

## 2020-10-22 MED ORDER — ESTROGENS CONJUGATED 0.625 MG PO TABS: 0.6250 mg | ORAL_TABLET | Freq: Every day | ORAL | 3 refills | Status: DC

## 2020-10-22 NOTE — Patient Instructions (Addendum)
Please contact Dr. Kenton Kingfisher for an appointment in 6 months and we will plan to see you back in 1 year. If you have concerning symptoms, contact us and we will see you sooner.    Conjugated Estrogens tablets What is this medicine? CONJUGATED ESTROGENS (CON ju gate ed ESS troe jenz) is an estrogen. It is used as hormone replacement in menopausal women. It helps to treat hot flashes and prevent osteoporosis. It is also used to treat women with low hormone levels or in those who have had their ovaries removed. This medicine may be used for other purposes; ask your health care provider or pharmacist if you have questions. COMMON BRAND NAME(S): Premarin What should I tell my health care provider before I take this medicine? They need to know if you have any of these conditions:  abnormal vaginal bleeding  blood vessel disease or blood clots  breast, cervical, endometrial, ovarian, liver, or uterine cancer  dementia  diabetes  endometriosis  fibroids  gallbladder disease  heart disease or recent heart attack  high blood pressure  high cholesterol  high level of calcium in the blood  kidney disease  liver disease  mental depression  migraine headaches  protein C deficiency  protein S deficiency  stroke  tobacco smoker  an unusual or allergic reaction to estrogens, other medicines, foods, dyes, or preservatives  pregnant or trying to get pregnant  breast-feeding How should I use this medicine? Take this medicine by mouth with a glass of water. Follow the directions on the prescription label. Take your medicine at regular intervals, at the same time each day. Do not take your medicine more often than directed. A patient package insert for the product will be given with each prescription and refill. Read this sheet carefully each time. Talk to your pediatrician regarding the use of this medicine in children. This medicine is not approved for use in children. Overdosage:  If you think you have taken too much of this medicine contact a poison control center or emergency room at once. NOTE: This medicine is only for you. Do not share this medicine with others. What if I miss a dose? If you miss a dose, take it as soon as you can. If it is almost time for your next dose, take only that dose. Do not take double or extra doses. What may interact with this medicine? Do not take this medicine with any of the following medications:  aromatase inhibitors like aminoglutethimide, anastrozole, exemestane, letrozole, testolactone  metyrapone This medicine may also interact with the following medications:  barbiturates, such as phenobarbital  carbamazepine  clarithromycin  erythromycin  grapefruit juice  medicines for fungal infections like ketoconazole and itraconazole  phenytoin  rifampin  ritonavir  St. John's Wort  thyroid hormones This list may not describe all possible interactions. Give your health care provider a list of all the medicines, herbs, non-prescription drugs, or dietary supplements you use. Also tell them if you smoke, drink alcohol, or use illegal drugs. Some items may interact with your medicine. What should I watch for while using this medicine? Visit your health care professional for regular checks on your progress. You will need a regular breast and pelvic exam and Pap smear while on this medicine. You should also discuss the need for regular mammograms with your health care professional, and follow his or her guidelines for these tests. This medicine can make your body retain fluid, making your fingers, hands, or ankles swell. Your blood pressure can  go up. Contact your doctor or health care professional if you feel you are retaining fluid. If you have any reason to think you are pregnant; stop taking this medicine at once and contact your doctor or health care professional. Smoking increases the risk of getting a blood clot or having  a stroke while you are taking this medicine, especially if you are more than 53 years old. You are strongly advised not to smoke. If you wear contact lenses and notice visual changes, or if the lenses begin to feel uncomfortable, consult your eye care specialist. The tablet shell for some brands of this medicine does not dissolve. The tablet shell may appear whole in the stool. This is not a cause for concern. If you see something that resembles a tablet in your stool, talk to your healthcare provider. This medicine can increase the risk of developing a condition (endometrial hyperplasia) that may lead to cancer of the lining of the uterus. Taking progestins, another hormone drug, with this medicine lowers the risk of developing this condition. Therefore, if your uterus has not been removed (by a hysterectomy), your doctor may prescribe a progestin for you to take together with your estrogen. You should know, however, that taking estrogens with progestins may have additional health risks. You should discuss the use of estrogens and progestins with your health care professional to determine the benefits and risks for you. If you are going to have surgery, you may need to stop taking this medicine. Consult your health care professional for advice before you schedule the surgery. What side effects may I notice from receiving this medicine? Side effects that you should report to your doctor or health care professional as soon as possible:  allergic reactions like skin rash, itching or hives, swelling of the face, lips, or tongue  breast tissue changes or discharge  changes in vision  chest pain  confusion, trouble speaking or understanding  dark urine  general ill feeling or flu-like symptoms  light-colored stools  nausea, vomiting  pain, swelling, warmth in the leg  right upper belly pain  severe headaches  shortness of breath  sudden numbness or weakness of the face, arm or  leg  trouble walking, dizziness, loss of balance or coordination  unusual vaginal bleeding  yellowing of the eyes or skin Side effects that usually do not require medical attention (report to your doctor or health care professional if they continue or are bothersome):  hair loss  increased hunger or thirst  increased urination  symptoms of vaginal infection like itching, irritation or unusual discharge  unusually weak or tired This list may not describe all possible side effects. Call your doctor for medical advice about side effects. You may report side effects to FDA at 1-800-FDA-1088. Where should I keep my medicine? Keep out of the reach of children. Store at room temperature between 15 and 30 degrees C (59 and 86 degrees F). Throw away any unused medicine after the expiration date. NOTE: This sheet is a summary. It may not cover all possible information. If you have questions about this medicine, talk to your doctor, pharmacist, or health care provider.  2021 Elsevier/Gold Standard (2010-09-30 09:20:56)

## 2020-10-22 NOTE — Progress Notes (Signed)
Gynecologic Oncology Interval Visit   Referring Provider: Ardeth Perfect, PA  Chief Complaint: Endometrioid Carcinoma  Subjective:  Susan Strickland is a 55 y.o. female who is seen in consultation from [facility], [place] for endometrioid carcinoma.   She presents for surveillance. She has vasomotor symptoms that have been quite symptomatic and she believes associated with the fatigue. She has a h/o chronic low back pain, new onset and intermittent fingers tingling. She does not have neck pain. She is also having a hard time with depression. She has been on anti-depressants for a few years. She does not have a therapist. Since her surgery she feels that the menopausal symptoms have worsened her depression.    She saw Dr. Kenton Kingfisher on 03/03/2020 and had an unremarkable exam.   She sees Dr. Erlene Quan, Urology and was started on Myrbetriq 25 mg for mixed urinary incontinence, OAB. She has no complaints of bladder issues today.   Gynecologic Oncology History:  Susan Strickland is a pleasant female who is seen in consultation from [facility], [place] for endometrioid carcinoma. She initially presented for AUB s/p endometrial biopsy on 01/18/20. Please refer to prior notes for complete detail.   On 02/20/2020 she underwent Exam under anesthesia, Robotic assisted total hysterectomy and bilateral salpingo-oophorectomy with sentinel node injection, mapping, and biopsies.  - ENDOMETRIAL ADENOCARCINOMA, ENDOMETRIOID TYPE, FIGO GRADE I.  Tumor Size: Greatest dimension: 0.6 cm  Histologic Type: Endometrioid carcinoma, NOS  Histologic Grade: FIGO grade 1  Myometrial Invasion: Not identified  Lymphovascular Invasion: Not identified  SENTINEL LYMPH NODEs, RIGHT OBTURATOR and LEFT OBTURATOR; EXCISIONAL BIOPSY:  NEGATIVE FOR MALIGNANCY (0/2).   Immunohistochemistry (IHC) Testing for DNA Mismatch Repair (MMR)  Proteins:  Results:  MLH1: Intact nuclear expression  MSH2: Intact nuclear expression  MSH6: Intact  nuclear expression  PMS2: Intact nuclear expression   Cytology: negative    Problem List: Patient Active Problem List   Diagnosis Date Noted  . S/P laparoscopic hysterectomy 03/03/2020  . Menopause 03/03/2020  . Endometrial cancer ([place]) 01/30/2020  . Urinary incontinence 01/30/2020  . Breast mass, right 12/11/2019  . Abdominal pain   . BMI 40.0-44.9, adult ([place]) 02/19/2019  . Encounter for screening mammogram for breast cancer   . Acute gastritis without hemorrhage   . S/P bariatric surgery 04/25/2017  . Vitamin B12 nutritional deficiency 04/25/2017  . Obesity (BMI 30-39.9) 04/25/2017  . Normocytic anemia 04/25/2017  . Hyperlipidemia 04/25/2017  . Abnormal mammogram 02/01/2017  . PMDD (premenstrual dysphoric disorder) 10/21/2015  . Vitamin D deficiency 10/21/2015  . Degeneration of intervertebral disc of lumbar region 12/23/2014  . Calculus of kidney 04/29/2014  . L-S radiculopathy 11/04/2011    Past Medical History: Past Medical History:  Diagnosis Date  . Anemia   . Anxiety   . Breast cyst   . Degenerative disc disease, lumbar   . Depression   . Endometrial cancer ([place])   . History of mammogram 05/2012; 09/24/2014   wnl per pt; benign  . History of Papanicolaou smear of cervix 08/15/2012   -/-  . Hyperlipemia   . Vitamin D deficiency     Past Surgical History: Past Surgical History:  Procedure Laterality Date  . BACK SURGERY  2015   L5  . BREAST BIOPSY Left 2016   NEG  . COLONOSCOPY WITH PROPOFOL N/A 04/27/2018   Procedure: COLONOSCOPY WITH BIOPSIES;  Surgeon: Lucilla Lame, MD;  Location: [place];  Service: Endoscopy;  Laterality: N/A;  . ESOPHAGOGASTRODUODENOSCOPY (EGD) WITH PROPOFOL N/A 04/27/2018  Procedure: ESOPHAGOGASTRODUODENOSCOPY (EGD) WITH BIOPSIES;  Surgeon: Lucilla Lame, MD;  Location: [place];  Service: Endoscopy;  Laterality: N/A;  . ESOPHAGOGASTRODUODENOSCOPY (EGD) WITH PROPOFOL N/A 06/29/2019   Procedure:  ESOPHAGOGASTRODUODENOSCOPY (EGD) WITH PROPOFOL;  Surgeon: Lucilla Lame, MD;  Location: [facility];  Service: Endoscopy;  Laterality: N/A;  . 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   Marland Kitchen POLYPECTOMY N/A 04/27/2018   Procedure: POLYPECTOMY;  Surgeon: Lucilla Lame, MD;  Location: [place];  Service: Endoscopy;  Laterality: N/A;  . ROBOTIC ASSISTED TOTAL HYSTERECTOMY WITH BILATERAL SALPINGO OOPHERECTOMY N/A 02/20/2020   Procedure: XI ROBOTIC ASSISTED TOTAL HYSTERECTOMY WITH BILATERAL SALPINGO OOPHORECTOMY;  Surgeon: Gillis Ends, MD;  Location: [facility];  Service: Gynecology;  Laterality: N/A;  . SENTINEL NODE BIOPSY N/A 02/20/2020   Procedure: SENTINEL NODE DISSECTION;  Surgeon: Gillis Ends, MD;  Location: [facility];  Service: Gynecology;  Laterality: N/A;  . TOTAL VAGINAL HYSTERECTOMY      Past Gynecologic History:  Menarche: age 57 Pain with menses  OB History:  OB History  Gravida Para Term Preterm AB Living  '4 3 3   1 3  ' SAB IAB Ectopic Multiple Live Births  1       3    # Outcome Date GA Lbr Len/2nd Weight Sex Delivery Anes PTL Lv  4 Term 07/07/92          3 Term 04/21/89          2 SAB 07/13/87          1 Term 11/20/85            Obstetric Comments  1st Menstrual Cycle:  14  1st Pregnancy:  19    Family History: h/o VTE in her daughter associated with leg fracture and immobility.  Family History  Problem Relation Age of Onset  . Breast cancer Mother 64  . Hypertension Father   . Kidney disease Father   . Diabetes Father        type 2  . Heart disease Father     Social History: Social History   Socioeconomic History  . Marital status: Married    Spouse name: Not on file  . Number of children: Not on file  . Years of education: 33  . Highest education level: Not on file  Occupational History  . Occupation: Pharmacist, hospital  Tobacco Use  . Smoking status: Never Smoker  . Smokeless tobacco: Never Used  Vaping Use  . Vaping Use:  Never used  Substance and Sexual Activity  . Alcohol use: No  . Drug use: No  . Sexual activity: Not Currently    Birth control/protection: Surgical, Other-see comments    Comment: vasectomy  Other Topics Concern  . Not on file  Social History Narrative  . Not on file   Social Determinants of Health   Financial Resource Strain: Not on file  Food Insecurity: Not on file  Transportation Needs: Not on file  Physical Activity: Not on file  Stress: Not on file  Social Connections: Not on file  Intimate Partner Violence: Not on file    Allergies: Allergies  Allergen Reactions  . Lac Bovis Nausea And Vomiting       . Doxycycline Nausea Only    Hot flashes, vivid dreams  . Milk-Related Compounds Nausea And Vomiting       . Sweetness Enhancer     headaches  . Azithromycin Rash    Current Medications: Current Outpatient Medications  Medication Sig Dispense Refill  . acetaminophen (TYLENOL) 325 MG tablet Take 650 mg by mouth every 6 (six) hours as needed for moderate pain.    . Ascorbic Acid (VITAMIN C) 1000 MG tablet Take 1,000 mg by mouth every evening.     . Cholecalciferol 25 MCG (1000 UT) tablet Take 1,000 Units by mouth every evening.     . cyclobenzaprine (FLEXERIL) 5 MG tablet Take 1 tablet (5 mg total) by mouth 3 (three) times daily as needed for muscle spasms. 30 tablet 0  . estrogens, conjugated, (PREMARIN) 0.625 MG tablet Take 1 tablet (0.625 mg total) by mouth daily. 30 tablet 3  . ferrous sulfate 325 (65 FE) MG tablet Take 325 mg by mouth every evening.     . mirabegron ER (MYRBETRIQ) 25 MG TB24 tablet Take 1 tablet (25 mg total) by mouth daily. 90 tablet 4  . sertraline (ZOLOFT) 100 MG tablet TAKE 1 TABLET BY MOUTH EVERY DAY (Patient taking differently: Take 150 mg by mouth at bedtime.) 90 tablet 1  . vitamin B-12 (CYANOCOBALAMIN) 500 MCG tablet Take 500 mcg by mouth every evening.     . zinc gluconate 50 MG tablet Take 50 mg by mouth every evening.     .  fluticasone (FLONASE) 50 MCG/ACT nasal spray Place 1 spray into both nostrils 2 (two) times daily. (Patient not taking: Reported on 10/22/2020) 16 g 6  . ibuprofen (ADVIL) 800 MG tablet Take 1 tablet (800 mg total) by mouth every 8 (eight) hours as needed for mild pain or moderate pain (Take with food). (Patient not taking: Reported on 10/22/2020) 90 tablet 0   No current facility-administered medications for this visit.   Review of Systems General: fatigue since surgery which she attributes to menopause  HEENT: no complaints  Lungs: no complaints  Cardiac: no complaints  GI: no complaints  GU: no complaints  Musculoskeletal: back pain  Extremities: no complaints  Skin: no complaints  Neuro: finger tingling  Endocrine: no complaints  Psych: feeling sad        Objective:  Physical Examination:  Body mass index is 40.43 kg/m. BP 116/73   Pulse 76   Temp 97.8 F (36.6 C)   Resp 20   Wt 207 lb (93.9 kg)   SpO2 98%   BMI 40.43 kg/m     ECOG Performance Status: 0 - Asymptomatic  GENERAL: Patient is a well appearing female in no acute distress HEENT:  PERRL, neck supple with midline trachea.  NODES:  No cervical, supraclavicular, axillary, or inguinal lymphadenopathy palpated.  LUNGS:  Clear to auscultation bilaterally.   HEART:  Regular rate and rhythm.  ABDOMEN:  Soft, nontender, nondistended. No ascites or masses. Incisions all healed and no hernias. MSK:  No focal spinal tenderness to palpation. Full range of motion bilaterally in the upper extremities. EXTREMITIES:  No peripheral edema.   NEURO:  Nonfocal. Well oriented.  Appropriate affect.  Pelvic: EGBUS: no lesions Cervix: absent Vagina: no lesions, no discharge or bleeding Uterus: absent BME: no palpable masses or nodularity Rectovaginal: deferred  Lab Review Labs on site today: n/a  Radiologic Imaging: n/a    Assessment:  Susan Strickland is a 55 y.o. female diagnosed with stage Ia grade 1  endometrioid endometrial cancer. Clinically NED  Urinary incontinence, suspect stress incontinence, controlled with Myrbetriq.   Menopausal symptoms, intolerable and negatively affecting quality of life.   Back pain and neuropathy.   Medical co-morbidities complicating care: prior surgery. Body mass index  is 40.43 kg/m.  Plan:   Problem List Items Addressed This Visit      Genitourinary   Endometrial cancer ([place]) - Primary     Other   Menopause     We previously recommended continued close follow up with exams, including pelvic exams every 6 months for 5 years and then annually thereafter. She would like to follow up with Dr. Kenton Kingfisher and we asked her to make an appt with him in 6 months. We will see her back in one year.   At her last visit we provided patient education for obesity, lifestyle, exercise, nutrition, sexual health, vaginal lubricants. We discussed her weight and need for weight loss, stressed good nutrition, and exercise.   I have recommended estrogen therapy for her menopausal symptoms give the negative effect on her quality of life. Counseling was provided regarding side effects of premarin. We discussed the risks with endometrial cancer recurrence. Barakat and colleagues reported the findings of the GOG trial evaluating ERT in women with stage I and II endometrial cancer. The study was incomplete study and cannot conclusively refute or support the safety of exogenous estrogen with regard to risk of endometrial recurrence. However, the findings demonstrated that the absolute recurrence rate (2.1%) and the incidence of new malignancy was very low. In this situation I think the benefits of ERT on her overall health and mental well being outweigh the risks. I also recommended she talk to her PCP regarding the type of anti-depressant therapy and the role of counseling.   She will follow up with Dr Erlene Quan regarding Urology issues; and her PCP for her other medical issues.    [name] diagnosis, an outline of the further diagnostic and laboratory studies which will be required, the recommendation for surgery, and alternatives were discussed with her and her accompanying family members.  All questions were answered to their satisfaction.   Taydon Nasworthy Gaetana Michaelis, MD

## 2020-10-30 ENCOUNTER — Encounter: Payer: Self-pay | Admitting: Obstetrics and Gynecology

## 2020-10-30 NOTE — Progress Notes (Signed)
Cancelled appointment

## 2020-11-29 ENCOUNTER — Other Ambulatory Visit: Payer: Self-pay | Admitting: Nurse Practitioner

## 2021-03-24 NOTE — Progress Notes (Incomplete)
03/24/21 8:28 AM   Susan Strickland 19-Aug-1965 [id number]  Referring provider:  Leonel Ramsay, MD [place],  [place] [address] No chief complaint on file.    HPI: Susan Strickland is a 55 y.o.female with a personal history of mixed urinary incontinence, OAB, and endometrioid carcinoma , who presents today for a 1 year follow-up with [facility].   She was diagnosed with endometrioid carcinoma in 2021. Pathology on 01/18/2020  Showed focal endometrioid carcinoma, FIGO grade 1, arising in complex atypical hyperplasia.   She underwent cytology on 02/20/2020 which was negative for malignancy. Reactive mesothelial cells and mixed inflammatory cells.   She is s/p robotic assisted total hysterectomy and bilateral salpingo-oophorectomy with sentinel node injection, mapping and biopsies with Dr. Theora Gianotti. Pathology revealed negative for malignancy. Endometrial adenocarcinoma, endometrioid type, FIGO grade 1. Bilateral ovaries with hemorrhagic corpus luteum cysts. Bilateral fallopian tubes with no significant pathologic alteration.      PMH: Past Medical History:  Diagnosis Date   Anemia    Anxiety    Breast cyst    Degenerative disc disease, lumbar    Depression    Endometrial cancer ([place])    History of mammogram 05/2012; 09/24/2014   wnl per pt; benign   History of Papanicolaou smear of cervix 08/15/2012   -/-   Hyperlipemia    Vitamin D deficiency     Surgical History: Past Surgical History:  Procedure Laterality Date   BACK SURGERY  2015   L5   BREAST BIOPSY Left 2016   NEG   COLONOSCOPY WITH PROPOFOL N/A 04/27/2018   Procedure: COLONOSCOPY WITH BIOPSIES;  Surgeon: Lucilla Lame, MD;  Location: [place];  Service: Endoscopy;  Laterality: N/A;   ESOPHAGOGASTRODUODENOSCOPY (EGD) WITH PROPOFOL N/A 04/27/2018   Procedure: ESOPHAGOGASTRODUODENOSCOPY (EGD) WITH BIOPSIES;  Surgeon: Lucilla Lame, MD;  Location: [place];  Service: Endoscopy;   Laterality: N/A;   ESOPHAGOGASTRODUODENOSCOPY (EGD) WITH PROPOFOL N/A 06/29/2019   Procedure: ESOPHAGOGASTRODUODENOSCOPY (EGD) WITH PROPOFOL;  Surgeon: Lucilla Lame, MD;  Location: [facility];  Service: Endoscopy;  Laterality: N/A;   GASTRIC BYPASS  03/2014   gastric sleeve.    POLYPECTOMY N/A 04/27/2018   Procedure: POLYPECTOMY;  Surgeon: Lucilla Lame, MD;  Location: [place];  Service: Endoscopy;  Laterality: N/A;   ROBOTIC ASSISTED TOTAL HYSTERECTOMY WITH BILATERAL SALPINGO OOPHERECTOMY N/A 02/20/2020   Procedure: XI ROBOTIC ASSISTED TOTAL HYSTERECTOMY WITH BILATERAL SALPINGO OOPHORECTOMY;  Surgeon: Gillis Ends, MD;  Location: [facility];  Service: Gynecology;  Laterality: N/A;   SENTINEL NODE BIOPSY N/A 02/20/2020   Procedure: SENTINEL NODE DISSECTION;  Surgeon: Gillis Ends, MD;  Location: [facility];  Service: Gynecology;  Laterality: N/A;   TOTAL VAGINAL HYSTERECTOMY      Home Medications:  Allergies as of 03/25/2021       Reactions   Lac Bovis Nausea And Vomiting       Doxycycline Nausea Only   Hot flashes, vivid dreams   Milk-related Compounds Nausea And Vomiting       Sweetness Enhancer    headaches   Azithromycin Rash        Medication List        Accurate as of March 24, 2021  8:28 AM. If you have any questions, ask your nurse or doctor.          acetaminophen 325 MG tablet Commonly known as: TYLENOL Take 650 mg by mouth every 6 (six) hours as needed for moderate pain.  Cholecalciferol 25 MCG (1000 UT) tablet Take 1,000 Units by mouth every evening.   cyclobenzaprine 5 MG tablet Commonly known as: FLEXERIL Take 1 tablet (5 mg total) by mouth 3 (three) times daily as needed for muscle spasms.   estradiol 0.5 MG tablet Commonly known as: ESTRACE TAKE 2 TABLETS BY MOUTH DAILY.   ferrous sulfate 325 (65 FE) MG tablet Take 325 mg by mouth every evening.   fluticasone 50 MCG/ACT nasal spray Commonly known as:  FLONASE Place 1 spray into both nostrils 2 (two) times daily.   ibuprofen 800 MG tablet Commonly known as: ADVIL Take 1 tablet (800 mg total) by mouth every 8 (eight) hours as needed for mild pain or moderate pain (Take with food).   mirabegron ER 25 MG Tb24 tablet Commonly known as: MYRBETRIQ Take 1 tablet (25 mg total) by mouth daily.   sertraline 100 MG tablet Commonly known as: ZOLOFT TAKE 1 TABLET BY MOUTH EVERY DAY What changed:  how much to take when to take this   vitamin B-12 500 MCG tablet Commonly known as: CYANOCOBALAMIN Take 500 mcg by mouth every evening.   vitamin C 1000 MG tablet Take 1,000 mg by mouth every evening.   zinc gluconate 50 MG tablet Take 50 mg by mouth every evening.        Allergies:  Allergies  Allergen Reactions   Lac Bovis Nausea And Vomiting        Doxycycline Nausea Only    Hot flashes, vivid dreams   Milk-Related Compounds Nausea And Vomiting        Sweetness Enhancer     headaches   Azithromycin Rash    Family History: Family History  Problem Relation Age of Onset   Breast cancer Mother 14   Hypertension Father    Kidney disease Father    Diabetes Father        type 2   Heart disease Father     Social History:  reports that she has never smoked. She has never used smokeless tobacco. She reports that she does not drink alcohol and does not use drugs.   Physical Exam: There were no vitals taken for this visit.  Constitutional:  Alert and oriented, No acute distress. HEENT: [place] AT, moist mucus membranes.  Trachea midline, no masses. Cardiovascular: No clubbing, cyanosis, or edema. Respiratory: Normal respiratory effort, no increased work of breathing. Skin: No rashes, bruises or suspicious lesions. Neurologic: Grossly intact, no focal deficits, moving all 4 extremities. Psychiatric: Normal mood and affect.  Laboratory Data:  Lab Results  Component Value Date   CREATININE 0.79 02/18/2020     Lab Results   Component Value Date   HGBA1C 5.3 02/18/2020    Urinalysis   Pertinent Imaging: No results found for any visits on 03/25/21.    Assessment & Plan:     No follow-ups on file.  I,Kailey Littlejohn,acting as a Education administrator for Hollice Espy, MD.,have documented all relevant documentation on the behalf of Hollice Espy, MD,as directed by  Hollice Espy, MD while in the presence of Hollice Espy, [place] [address] [place], [place] [address] [phone]

## 2021-03-25 ENCOUNTER — Ambulatory Visit: Payer: Self-pay | Admitting: Urology

## 2021-03-26 ENCOUNTER — Encounter: Payer: Self-pay | Admitting: Urology

## 2021-03-28 IMAGING — CT CT ABD-PELV W/ CM
2 of 5 series · 16 of 46 positions shown, 18 images · IV contrast (omnipaque)
Comparison: CT of the abdomen pelvis dated 04/26/2014.

CLINICAL DATA: 53-year-old female with mid abdominal pain.

EXAM:
CT ABDOMEN AND PELVIS WITH CONTRAST
TECHNIQUE: Multidetector CT imaging of the abdomen and pelvis was performed
using the standard protocol following bolus administration of
intravenous contrast.
CONTRAST:  100mL OMNIPAQUE IOHEXOL 300 MG/ML  SOLN

[Series 2: axial st · axial · 0.88mm/px · z∈[-998,-573]mm · 13 of 95 slices shown, 15 images]
[im 5/95  soft-tissue]
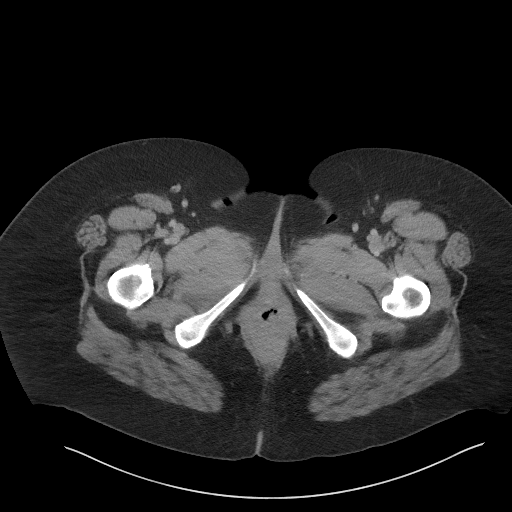
[im 5/95  bone]
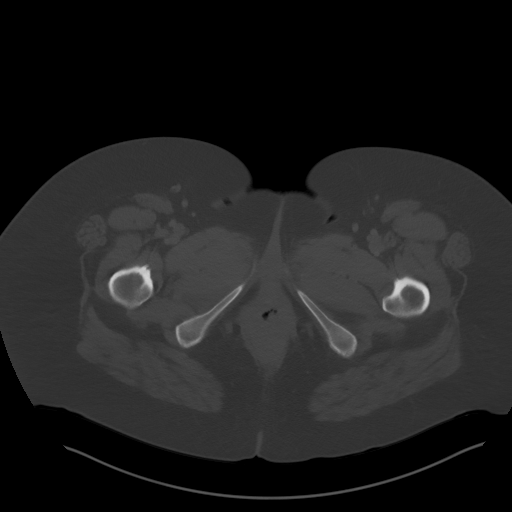
[im 15/95  soft-tissue]
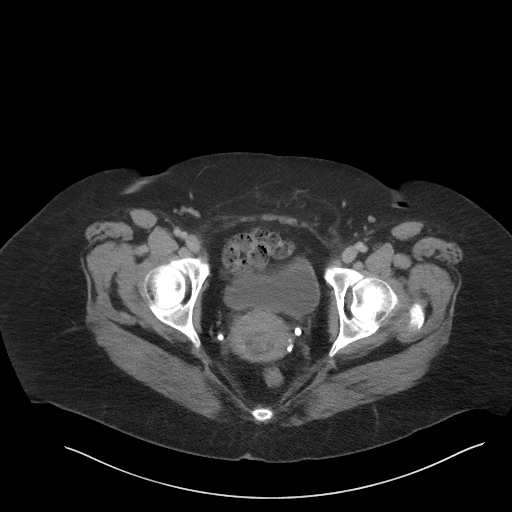
[im 19/95  soft-tissue]
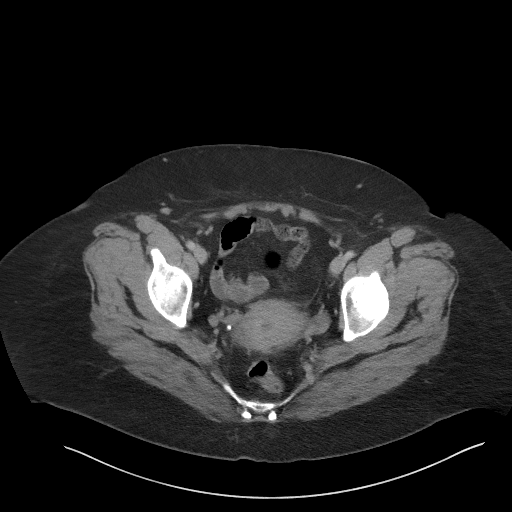
[im 29/95  soft-tissue]
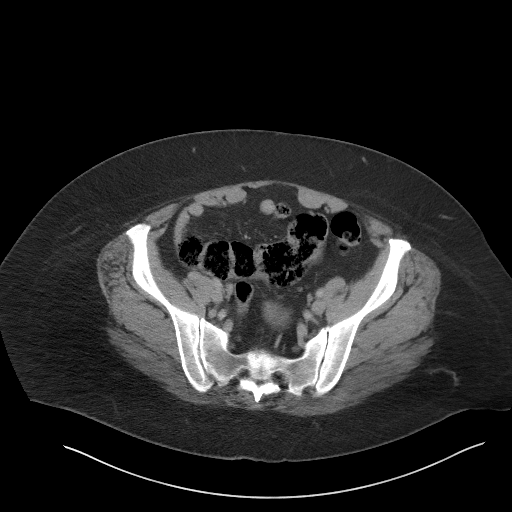
[im 33/95  soft-tissue]
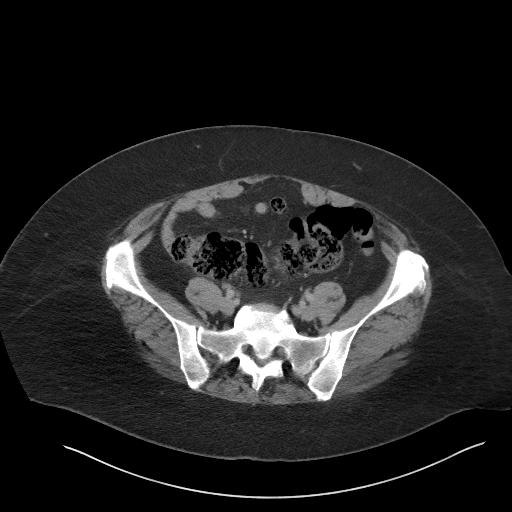
[im 43/95  soft-tissue]
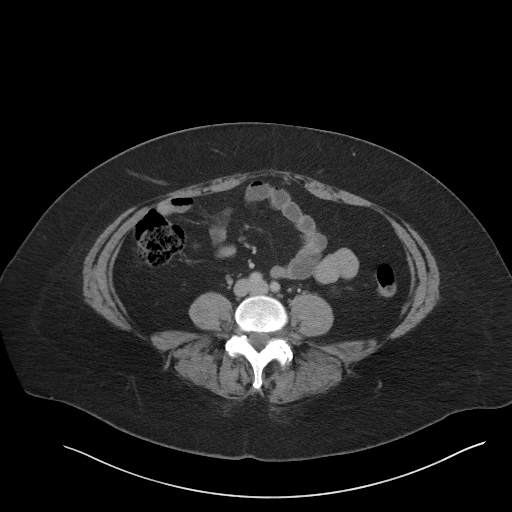
[im 48/95  soft-tissue]
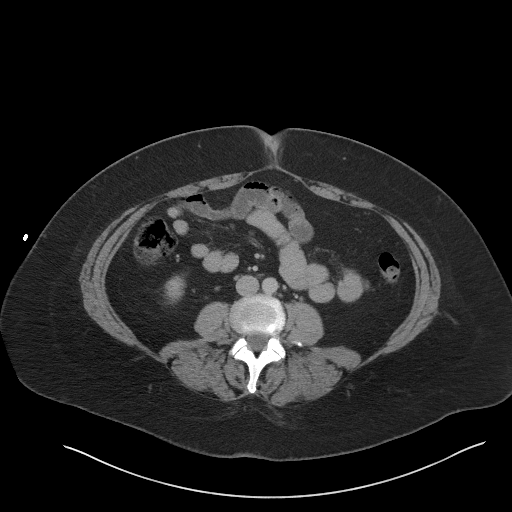
[im 52/95  soft-tissue]
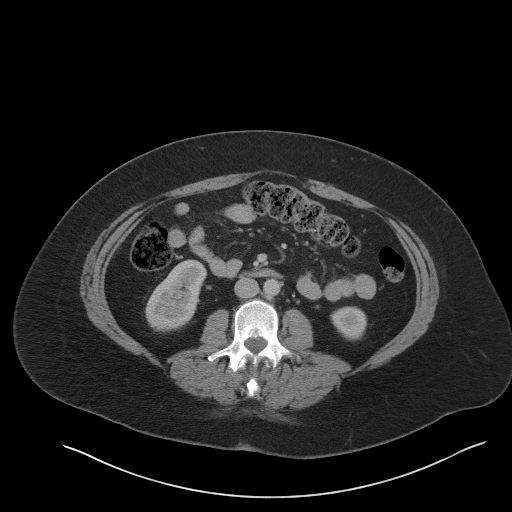
[im 62/95  soft-tissue]
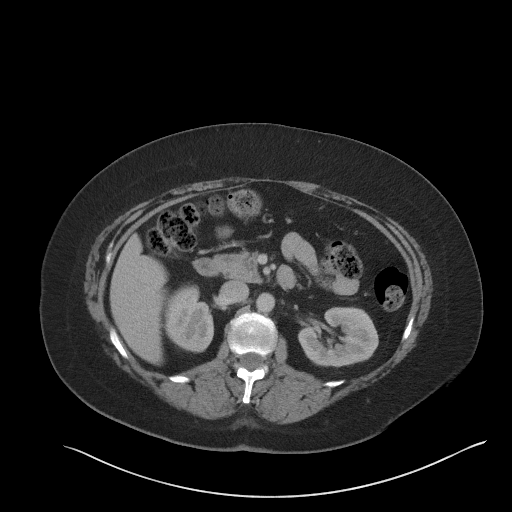
[im 62/95  bone]
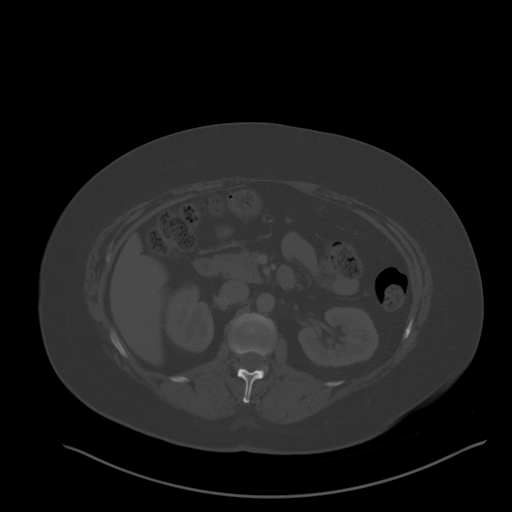
[im 66/95  soft-tissue]
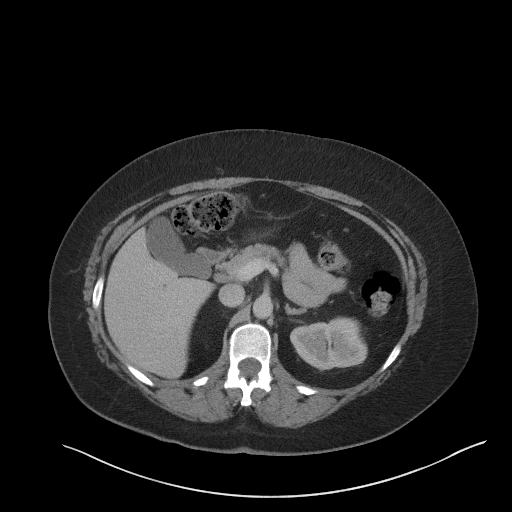
[im 76/95  soft-tissue]
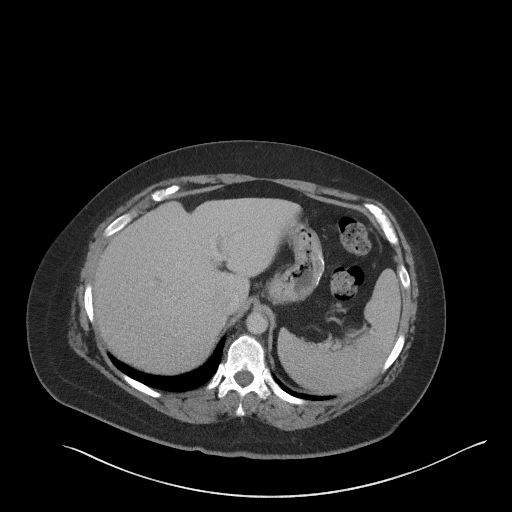
[im 80/95  soft-tissue]
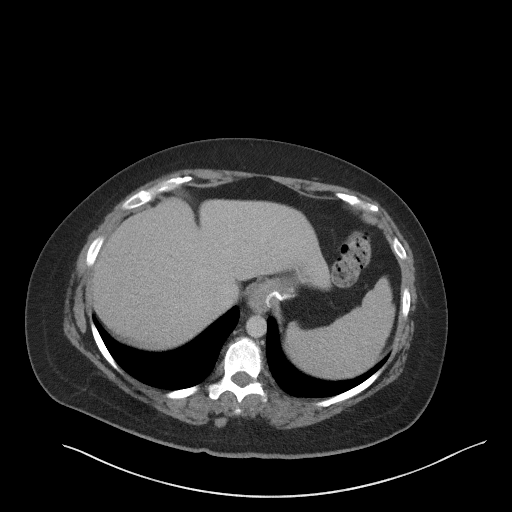
[im 90/95  soft-tissue]
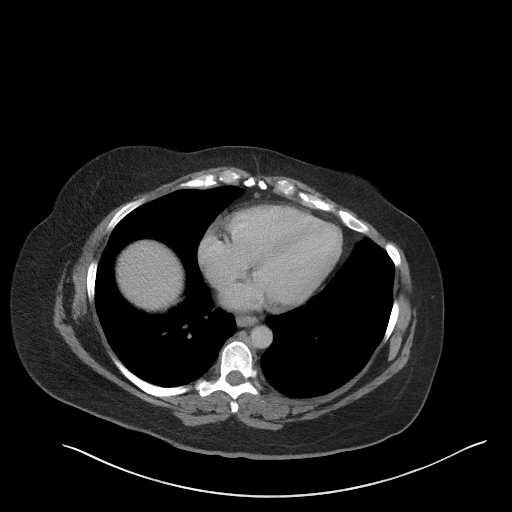

[Series 5: coronal st · coronal · 0.96mm/px · 3 of 94 slices shown]
[im 32/94  soft-tissue]
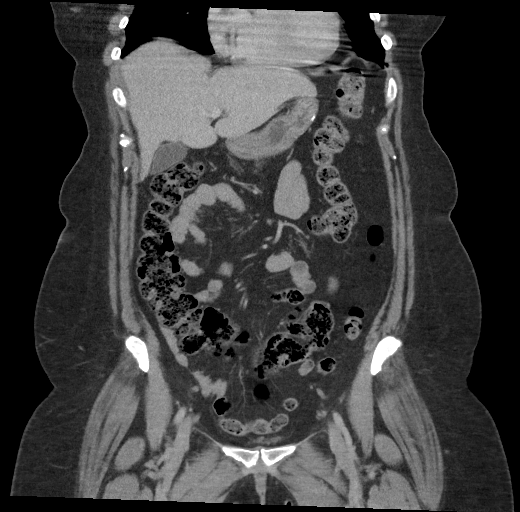
[im 42/94  soft-tissue]
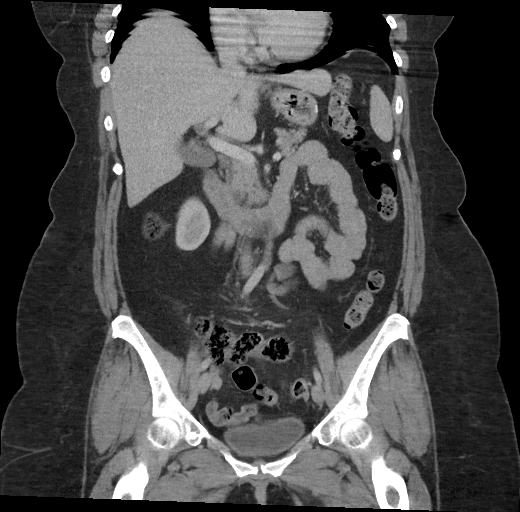
[im 52/94  soft-tissue]
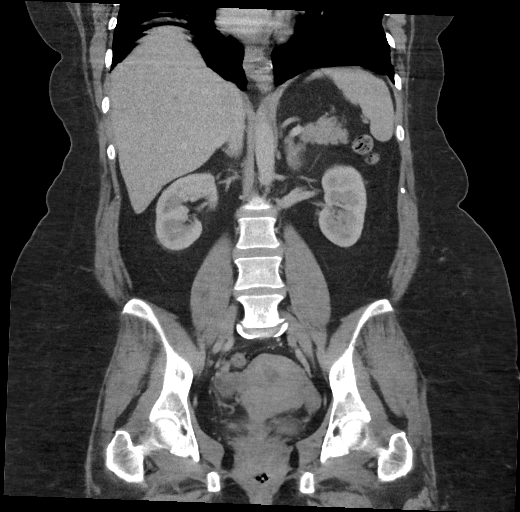

[16 of 46 positions shown; findings below may reference images not displayed]

FINDINGS: Lower chest: The visualized lung bases are clear.

No intra-abdominal free air or free fluid.

Hepatobiliary: No focal liver abnormality is seen. No gallstones,
gallbladder wall thickening, or biliary dilatation.

Pancreas: Unremarkable. No pancreatic ductal dilatation or
surrounding inflammatory changes.

Spleen: Normal in size without focal abnormality.

Adrenals/Urinary Tract: The adrenal glands, kidneys, and the
visualized ureters and urinary bladder appear unremarkable. There is
symmetric enhancement and excretion of contrast by both kidneys.

Stomach/Bowel: There is moderate stool throughout the colon. There
is postsurgical changes of gastric sleeve. There is no bowel
obstruction or active inflammation. The appendix is normal.

Vascular/Lymphatic: The abdominal aorta and IVC are unremarkable.
The SMV, splenic vein, and main portal vein are patent. No portal
venous gas. There is a focal nodular and curvilinear area of
stranding in the lower mesentery (series 2 image 58 and coronal
series 5, image 37). This may represent an inflamed mesenteric lymph
nodes or a focal mesenteric twisting. There is no fluid collection.

Reproductive: The uterus and ovaries are grossly unremarkable.

Other: Small fat containing umbilical hernia.

Musculoskeletal: Degenerative changes of the lower lumbar spine with
facet arthropathy. No acute osseous pathology.
IMPRESSION: 1. Focal nodular and curvilinear inflammation in the lower mesentery
may represent an inflamed mesenteric lymph nodes or a focal
mesenteric twisting.
2. No bowel obstruction or active inflammation. Normal appendix.

## 2021-04-01 ENCOUNTER — Other Ambulatory Visit: Payer: Self-pay | Admitting: Physical Medicine & Rehabilitation

## 2021-04-01 DIAGNOSIS — M542 Cervicalgia: Secondary | ICD-10-CM

## 2021-04-14 ENCOUNTER — Ambulatory Visit: Admission: RE | Admit: 2021-04-14 | Payer: BC Managed Care – PPO | Source: Ambulatory Visit

## 2021-04-23 ENCOUNTER — Ambulatory Visit: Payer: BC Managed Care – PPO | Admitting: Obstetrics & Gynecology

## 2021-05-24 ENCOUNTER — Ambulatory Visit
Admission: EM | Admit: 2021-05-24 | Discharge: 2021-05-24 | Disposition: A | Discharge status: Home / Self Care | Payer: BC Managed Care – PPO | Attending: Emergency Medicine | Admitting: Emergency Medicine

## 2021-05-24 ENCOUNTER — Encounter: Payer: Self-pay | Admitting: Emergency Medicine

## 2021-05-24 DIAGNOSIS — J101 Influenza due to other identified influenza virus with other respiratory manifestations: Secondary | ICD-10-CM | POA: Diagnosis not present

## 2021-05-24 LAB — POCT INFLUENZA A/B
Influenza A, POC: POSITIVE — AB
Influenza B, POC: NEGATIVE

## 2021-05-24 NOTE — Discharge Instructions (Signed)
You have been diagnosed with influenza. This is typically a self-limiting virus that does not require antibiotics. Most people will have symptoms for about 7 - 10 days. Pay special attention to handwashing as this can prevent spread of the virus.   Always read the labels of cough and cold medications as they may contain some of the ingredients below.  Rest, push lots of fluids (especially water), and utilize supportive care for symptoms. You may take acetaminophen (Tylenol) every 4-6 hours and ibuprofen every 6-8 hours for muscle pain, joint pain, headaches (you may also alternate these medications). Mucinex (guaifenesin) may be taken over the counter for cough as needed can loosen phlegm. Please read the instructions and take as directed.  Sudafed (pseudophedrine) is sold behind the counter and can help reduce nasal pressure; avoid taking this if you have high blood pressure or feel jittery. Sudafed PE (phenylephrine) can be a helpful, short-term, over-the-counter alternative to limit side effects or if you have high blood pressure.  Flonase nasal spray can help alleviate congestion and sinus pressure. Many patients choose Afrin as a nasal decongestant; do not use for more than 3 days for risk of rebound (increased symptoms after stopping medication).  Saline nasal sprays or rinses can also help nasal congestion (use bottled or sterile water). Warm tea with lemon and honey can sooth sore throat and cough, as can cough drops.   Return to clinic for high fever not improving with medications, chest pain, difficulty breathing, non-stop vomiting, or coughing blood. Follow-up with your primary care provider if symptoms do not improve as expected in the next 5-7 days.

## 2021-05-24 NOTE — ED Triage Notes (Signed)
Pt presents with cough,ST, bodyaches, and fever x 4 days

## 2021-05-24 NOTE — ED Provider Notes (Addendum)
Name: Susan Strickland Address: [place] [address] MRN: [id number] DOB: 1966/01/08 Age: 55 y.o. Gender: female Encounter Date: 05/24/2021 Primary Provider: Leonel Ramsay, MD  S: This is a 55 y.o. female with a 4 day history of flulike symptoms   +Myalgias and arthralgia.  +fatigue  + cough  - nasal congestion w/clear rhinorrhea  + fever;  - vomiting or diarrhea  Flu exposure: unknown   The following portions of the patient's history were reviewed and updated in Epic as appropriate: allergies, current medications, past medical history, past social history, past surgical history and problem list.   O:   Vitals:   05/24/21 1548  BP: 126/90  Pulse: 97  Temp: 98.2 F (36.8 C)  SpO2: 99%   Gen: WDWN, NAD   HEENT:NCAT, EOMI, EACs clear Nares without discharge; +moderate mucosal erythema and edema  OP moist with mild to moderate posterior cobblestoning, no lesions noted Neck:  Supple Chest: lungs clear to auscultation bilaterally, normal respiratory effort CV:    RRR, no murmur  Skin:  no rash Psychiatric: appropriate demeanor and responsiveness   Rapid flu: +  ASSESSMENT: Influenza A  PLAN:Outside the treatment window for tamiflu  Symptomatic care with tylenol/motrin for pain or fevers;   Increased fluids as tolerated; humidifier use qhs prn   No work/school until 24 hours fever free  F/u with PCP if worsening, or is not improving    Serafina Royals, FNP 05/24/21 1603    Serafina Royals, FNP 05/24/21 1603    Serafina Royals, FNP 05/24/21 1603

## 2021-08-31 ENCOUNTER — Telehealth: Payer: BC Managed Care – PPO | Admitting: Physician Assistant

## 2021-08-31 DIAGNOSIS — M65312 Trigger thumb, left thumb: Secondary | ICD-10-CM

## 2021-08-31 MED ORDER — MELOXICAM 15 MG PO TABS: 15.0000 mg | ORAL_TABLET | Freq: Every day | ORAL | 0 refills | Status: AC

## 2021-08-31 NOTE — Patient Instructions (Signed)
°  Jillene Bucks, thank you for joining Leeanne Rio, PA-C for today's virtual visit.  While this provider is not your primary care provider (PCP), if your PCP is located in our provider database this encounter information will be shared with them immediately following your visit.  Consent: (Patient) Susan Strickland provided verbal consent for this virtual visit at the beginning of the encounter.  Current Medications:  Current Outpatient Medications:    acetaminophen (TYLENOL) 325 MG tablet, Take 650 mg by mouth every 6 (six) hours as needed for moderate pain., Disp: , Rfl:    Ascorbic Acid (VITAMIN C) 1000 MG tablet, Take 1,000 mg by mouth every evening. , Disp: , Rfl:    Cholecalciferol 25 MCG (1000 UT) tablet, Take 1,000 Units by mouth every evening. , Disp: , Rfl:    cyclobenzaprine (FLEXERIL) 5 MG tablet, Take 1 tablet (5 mg total) by mouth 3 (three) times daily as needed for muscle spasms., Disp: 30 tablet, Rfl: 0   estradiol (ESTRACE) 0.5 MG tablet, TAKE 2 TABLETS BY MOUTH DAILY., Disp: 180 tablet, Rfl: 2   ferrous sulfate 325 (65 FE) MG tablet, Take 325 mg by mouth every evening. , Disp: , Rfl:    fluticasone (FLONASE) 50 MCG/ACT nasal spray, Place 1 spray into both nostrils 2 (two) times daily. (Patient not taking: Reported on 10/22/2020), Disp: 16 g, Rfl: 6   ibuprofen (ADVIL) 800 MG tablet, Take 1 tablet (800 mg total) by mouth every 8 (eight) hours as needed for mild pain or moderate pain (Take with food). (Patient not taking: Reported on 10/22/2020), Disp: 90 tablet, Rfl: 0   mirabegron ER (MYRBETRIQ) 25 MG TB24 tablet, Take 1 tablet (25 mg total) by mouth daily., Disp: 90 tablet, Rfl: 4   sertraline (ZOLOFT) 100 MG tablet, TAKE 1 TABLET BY MOUTH EVERY DAY (Patient taking differently: Take 150 mg by mouth at bedtime.), Disp: 90 tablet, Rfl: 1   vitamin B-12 (CYANOCOBALAMIN) 500 MCG tablet, Take 500 mcg by mouth every evening. , Disp: , Rfl:    zinc gluconate 50 MG tablet,  Take 50 mg by mouth every evening. , Disp: , Rfl:    Medications ordered in this encounter:  No orders of the defined types were placed in this encounter.    *If you need refills on other medications prior to your next appointment, please contact your pharmacy*  Follow-Up: Call back or seek an in-person evaluation if the symptoms worsen or if the condition fails to improve as anticipated.  Other Instructions    If you have been instructed to have an in-person evaluation today at a local Urgent Care facility, please use the link below. It will take you to a list of all of our available [facility], including address, phone number and hours of operation. Please do not delay care.  [facility]  If you or a family member do not have a primary care provider, use the link below to schedule a visit and establish care. When you choose a [facility] primary care physician or advanced practice provider, you gain a long-term partner in health. Find a Primary Care Provider  Learn more about [facility]'s in-office and virtual care options: [facility]

## 2021-08-31 NOTE — Progress Notes (Signed)
Virtual Visit Consent   Susan Strickland, you are scheduled for a virtual visit with a [facility] provider today.     Just as with appointments in the office, your consent must be obtained to participate.  Your consent will be active for this visit and any virtual visit you may have with one of our providers in the next 365 days.     If you have a MyChart account, a copy of this consent can be sent to you electronically.  All virtual visits are billed to your insurance company just like a traditional visit in the office.    As this is a virtual visit, video technology does not allow for your provider to perform a traditional examination.  This may limit your provider's ability to fully assess your condition.  If your provider identifies any concerns that need to be evaluated in person or the need to arrange testing (such as labs, EKG, etc.), we will make arrangements to do so.     Although advances in technology are sophisticated, we cannot ensure that it will always work on either your end or our end.  If the connection with a video visit is poor, the visit may have to be switched to a telephone visit.  With either a video or telephone visit, we are not always able to ensure that we have a secure connection.     I need to obtain your verbal consent now.   Are you willing to proceed with your visit today?    Susan Strickland has provided verbal consent on 08/31/2021 for a virtual visit (video or telephone).   Leeanne Rio, [place]   Date: 08/31/2021 3:19 PM   Virtual Visit via Video Note   I, Leeanne Rio, connected with  Susan Strickland  ([id number], 1965-08-23) on 08/31/21 at  3:00 PM EST by a video-enabled telemedicine application and verified that I am speaking with the correct person using two identifiers.  Location: Patient: Virtual Visit Location Patient: Home Provider: Virtual Visit Location Provider: Home Office   I discussed the limitations of evaluation and  management by telemedicine and the availability of in person appointments. The patient expressed understanding and agreed to proceed.    History of Present Illness: Susan Strickland is a 56 y.o. who identifies as a female who was assigned female at birth, and is being seen today for possible trigger finger of L thumb. Notes a couple of weeks of pain and catching of her left thumb, having to manually pop it out of stuck position. Denies trauma or injury. Denies numbness, tingling or weakness. Got a thumb spica splint OTC and has been wearing but seems to make pain and soreness worse. Marland Kitchen  HPI: HPI  Problems:  Patient Active Problem List   Diagnosis Date Noted   S/P laparoscopic hysterectomy 03/03/2020   Menopause 03/03/2020   Endometrial cancer ([facility]) 01/30/2020   Urinary incontinence 01/30/2020   Breast mass, right 12/11/2019   Abdominal pain    BMI 40.0-44.9, adult ([place]) 02/19/2019   Encounter for screening mammogram for breast cancer    Acute gastritis without hemorrhage    S/P bariatric surgery 04/25/2017   Vitamin B12 nutritional deficiency 04/25/2017   Obesity (BMI 30-39.9) 04/25/2017   Normocytic anemia 04/25/2017   Hyperlipidemia 04/25/2017   Abnormal mammogram 02/01/2017   PMDD (premenstrual dysphoric disorder) 10/21/2015   Vitamin D deficiency 10/21/2015   Degeneration of intervertebral disc of lumbar region 12/23/2014   Calculus of kidney  04/29/2014   L-S radiculopathy 11/04/2011    Allergies:  Allergies  Allergen Reactions   Lac Bovis Nausea And Vomiting        Doxycycline Nausea Only    Hot flashes, vivid dreams   Milk-Related Compounds Nausea And Vomiting        Sweetness Enhancer     headaches   Azithromycin Rash   Medications:  Current Outpatient Medications:    meloxicam (MOBIC) 15 MG tablet, Take 1 tablet (15 mg total) by mouth daily., Disp: 15 tablet, Rfl: 0   acetaminophen (TYLENOL) 325 MG tablet, Take 650 mg by mouth every 6 (six) hours as needed for  moderate pain., Disp: , Rfl:    Ascorbic Acid (VITAMIN C) 1000 MG tablet, Take 1,000 mg by mouth every evening. , Disp: , Rfl:    Cholecalciferol 25 MCG (1000 UT) tablet, Take 1,000 Units by mouth every evening. , Disp: , Rfl:    cyclobenzaprine (FLEXERIL) 5 MG tablet, Take 1 tablet (5 mg total) by mouth 3 (three) times daily as needed for muscle spasms., Disp: 30 tablet, Rfl: 0   estradiol (ESTRACE) 0.5 MG tablet, TAKE 2 TABLETS BY MOUTH DAILY., Disp: 180 tablet, Rfl: 2   ferrous sulfate 325 (65 FE) MG tablet, Take 325 mg by mouth every evening. , Disp: , Rfl:    fluticasone (FLONASE) 50 MCG/ACT nasal spray, Place 1 spray into both nostrils 2 (two) times daily. (Patient not taking: Reported on 10/22/2020), Disp: 16 g, Rfl: 6   ibuprofen (ADVIL) 800 MG tablet, Take 1 tablet (800 mg total) by mouth every 8 (eight) hours as needed for mild pain or moderate pain (Take with food). (Patient not taking: Reported on 10/22/2020), Disp: 90 tablet, Rfl: 0   mirabegron ER (MYRBETRIQ) 25 MG TB24 tablet, Take 1 tablet (25 mg total) by mouth daily., Disp: 90 tablet, Rfl: 4   sertraline (ZOLOFT) 100 MG tablet, TAKE 1 TABLET BY MOUTH EVERY DAY (Patient taking differently: Take 150 mg by mouth at bedtime.), Disp: 90 tablet, Rfl: 1   vitamin B-12 (CYANOCOBALAMIN) 500 MCG tablet, Take 500 mcg by mouth every evening. , Disp: , Rfl:    zinc gluconate 50 MG tablet, Take 50 mg by mouth every evening. , Disp: , Rfl:   Observations/Objective: Patient is well-developed, well-nourished in no acute distress.  Resting comfortably at home.  Head is normocephalic, atraumatic.  No labored breathing. Speech is clear and coherent with logical content.  Patient is alert and oriented at baseline.  Left thumb getting stuck with flexion and sometimes extension. Have to significantly counteract to get the digit to "pop" and move again. Some mild swelling noted without bruising. Opposition of thumb intact.   Assessment and Plan: 1.  Trigger finger of left thumb - meloxicam (MOBIC) 15 MG tablet; Take 1 tablet (15 mg total) by mouth daily.  Dispense: 15 tablet; Refill: 0  Will give Meloxicam to help with pain and inflammation. Tylenol for breakthrough pain. Supportive measures reviewed. Information given regarding our Ortho UC so she can get further evaluation for this.   Follow Up Instructions: I discussed the assessment and treatment plan with the patient. The patient was provided an opportunity to ask questions and all were answered. The patient agreed with the plan and demonstrated an understanding of the instructions.  A copy of instructions were sent to the patient via MyChartunless otherwise noted below.   The patient was advised to call back or seek an in-person evaluation if the symptoms worsen  or if the condition fails to improve as anticipated.  Time:  I spent 10 minutes with the patient via telehealth technology discussing the above problems/concerns.    Leeanne Rio, PA-C

## 2021-10-28 ENCOUNTER — Inpatient Hospital Stay: Payer: BC Managed Care – PPO | Attending: Obstetrics and Gynecology | Admitting: Obstetrics and Gynecology

## 2021-10-28 VITALS — BP 142/99 | HR 92 | Temp 98.7°F | Resp 20 | Wt 230.1 lb

## 2021-10-28 DIAGNOSIS — C541 Malignant neoplasm of endometrium: Secondary | ICD-10-CM

## 2021-10-28 DIAGNOSIS — Z8542 Personal history of malignant neoplasm of other parts of uterus: Secondary | ICD-10-CM | POA: Diagnosis present

## 2021-10-28 DIAGNOSIS — Z90722 Acquired absence of ovaries, bilateral: Secondary | ICD-10-CM | POA: Insufficient documentation

## 2021-10-28 DIAGNOSIS — Z90710 Acquired absence of both cervix and uterus: Secondary | ICD-10-CM | POA: Insufficient documentation

## 2021-10-28 NOTE — Progress Notes (Signed)
Gynecologic Oncology Interval Visit  ? ?Referring Provider: Ardeth Perfect, PA ? ?Chief Complaint: Endometrioid Carcinoma ? ?Subjective:  ?Susan Strickland is a 56 y.o. female who is seen in consultation from [employer], [place] for endometrioid carcinoma.  ? ?She presents for surveillance. She has a h/o chronic low back pain, new onset and intermittent fingers tingling. She does not have neck pain. She also had a hard time with depression.  She has been on anti-depressants for a few years. Since her surgery she felt that the menopausal symptoms worsened her depression. We did a trial of estradiol and that did not help, so discontinued.  ? ?She sees Dr. Erlene Quan, Urology and was started on Myrbetriq 25 mg for mixed urinary incontinence, OAB. She has no complaints of bladder issues today.  ? ?Gynecologic Oncology History:  ?Susan Strickland is a pleasant female who is seen in consultation from [employer], [place] for endometrioid carcinoma. She initially presented for AUB s/p endometrial biopsy on 01/18/20. Please refer to prior notes for complete detail.  ? ?On 02/20/2020 she underwent Exam under anesthesia, Robotic assisted total hysterectomy and bilateral salpingo-oophorectomy with sentinel node injection, mapping, and biopsies. ? ?- ENDOMETRIAL ADENOCARCINOMA, ENDOMETRIOID TYPE, FIGO GRADE I.  ?Tumor Size: Greatest dimension: 0.6 cm  ?Histologic Type: Endometrioid carcinoma, NOS  ?Histologic Grade: FIGO grade 1  ?Myometrial Invasion: Not identified  ?Lymphovascular Invasion: Not identified  ?SENTINEL LYMPH NODEs, RIGHT OBTURATOR and LEFT OBTURATOR; EXCISIONAL BIOPSY:  ?NEGATIVE FOR MALIGNANCY (0/2).  ? ?Immunohistochemistry (IHC) Testing for DNA Mismatch Repair (MMR)  ?Proteins:  ?Results:  ?MLH1: Intact nuclear expression  ?MSH2: Intact nuclear expression  ?MSH6: Intact nuclear expression  ?PMS2: Intact nuclear expression  ? ?Cytology: negative ? ?Problem List: ?Patient Active Problem List  ? Diagnosis Date Noted  ?  S/P laparoscopic hysterectomy 03/03/2020  ? Menopause 03/03/2020  ? Endometrial cancer ([facility]) 01/30/2020  ? Urinary incontinence 01/30/2020  ? Breast mass, right 12/11/2019  ? Abdominal pain   ? BMI 40.0-44.9, adult ([place]) 02/19/2019  ? Encounter for screening mammogram for breast cancer   ? Acute gastritis without hemorrhage   ? S/P bariatric surgery 04/25/2017  ? Vitamin B12 nutritional deficiency 04/25/2017  ? Obesity (BMI 30-39.9) 04/25/2017  ? Normocytic anemia 04/25/2017  ? Hyperlipidemia 04/25/2017  ? Abnormal mammogram 02/01/2017  ? PMDD (premenstrual dysphoric disorder) 10/21/2015  ? Vitamin D deficiency 10/21/2015  ? Degeneration of intervertebral disc of lumbar region 12/23/2014  ? Calculus of kidney 04/29/2014  ? L-S radiculopathy 11/04/2011  ? ? ?Past Medical History: ?Past Medical History:  ?Diagnosis Date  ? Anemia   ? Anxiety   ? Breast cyst   ? Degenerative disc disease, lumbar   ? Depression   ? Endometrial cancer ([place])   ? History of mammogram 05/2012; 09/24/2014  ? wnl per pt; benign  ? History of Papanicolaou smear of cervix 08/15/2012  ? -/-  ? Hyperlipemia   ? Vitamin D deficiency   ? ? ?Past Surgical History: ?Past Surgical History:  ?Procedure Laterality Date  ? BACK SURGERY  2015  ? L5  ? BREAST BIOPSY Left 2016  ? NEG  ? COLONOSCOPY WITH PROPOFOL N/A 04/27/2018  ? Procedure: COLONOSCOPY WITH BIOPSIES;  Surgeon: Lucilla Lame, MD;  Location: [facility];  Service: Endoscopy;  Laterality: N/A;  ? ESOPHAGOGASTRODUODENOSCOPY (EGD) WITH PROPOFOL N/A 04/27/2018  ? Procedure: ESOPHAGOGASTRODUODENOSCOPY (EGD) WITH BIOPSIES;  Surgeon: Lucilla Lame, MD;  Location: [facility];  Service: Endoscopy;  Laterality: N/A;  ? ESOPHAGOGASTRODUODENOSCOPY (  EGD) WITH PROPOFOL N/A 06/29/2019  ? Procedure: ESOPHAGOGASTRODUODENOSCOPY (EGD) WITH PROPOFOL;  Surgeon: Lucilla Lame, MD;  [facility] END[facility]COPY;  Service: Endoscopy;  Laterality: N/A;  ? GASTRIC BYPASS  03/2014  ? gastric sleeve.   ?  POLYPECTOMY N/A 04/27/2018  ? Procedure: POLYPECTOMY;  Surgeon: Lucilla Lame, MD;  Location: [facility];  Service: Endoscopy;  Laterality: N/A;  ? ROBOTIC ASSISTED TOTAL HYSTERECTOMY WITH BILATERAL SALPINGO OOPHERECTOMY N/A 02/20/2020  ? Procedure: XI ROBOTIC ASSISTED TOTAL HYSTERECTOMY WITH BILATERAL SALPINGO OOPHORECTOMY;  Surgeon: Gillis Ends, MD;  Location: [facility];  Service: Gynecology;  Laterality: N/A;  ? SENTINEL NODE BIOPSY N/A 02/20/2020  ? Procedure: SENTINEL NODE DISSECTION;  Surgeon: Gillis Ends, MD;  Location: [facility];  Service: Gynecology;  Laterality: N/A;  ? TOTAL VAGINAL HYSTERECTOMY    ? ? ?Past Gynecologic History:  ?Menarche: age 83 ?Pain with menses ? ?OB History:  ?OB History  ?Gravida Para Term Preterm AB Living  ?'4 3 3   1 3  ' ?SAB IAB Ectopic Multiple Live Births  ?1       3  ?  ?# Outcome Date GA Lbr Len/2nd Weight Sex Delivery Anes PTL Lv  ?4 Term 07/07/92          ?3 Term 04/21/89          ?2 SAB 07/13/87          ?1 Term 11/20/85          ?  ?Obstetric Comments  ?1st Menstrual Cycle:  14  ?1st Pregnancy:  19  ? ? ?Family History: h/o VTE in her daughter associated with leg fracture and immobility.  ?Family History  ?Problem Relation Age of Onset  ? Breast cancer Mother 70  ? Hypertension Father   ? Kidney disease Father   ? Diabetes Father   ?     type 2  ? Heart disease Father   ? ? ?Social History: ?Social History  ? ?Socioeconomic History  ? Marital status: Married  ?  Spouse name: Not on file  ? Number of children: Not on file  ? Years of education: 23  ? Highest education level: Not on file  ?Occupational History  ? Occupation: Pharmacist, hospital  ?Tobacco Use  ? Smoking status: Never  ? Smokeless tobacco: Never  ?Vaping Use  ? Vaping Use: Never used  ?Substance and Sexual Activity  ? Alcohol use: No  ? Drug use: No  ? Sexual activity: Not Currently  ?  Birth control/protection: Surgical, Other-see comments  ?  Comment: vasectomy  ?Other Topics Concern  ? Not  on file  ?Social History Narrative  ? Not on file  ? ?Social Determinants of Health  ? ?Financial Resource Strain: Not on file  ?Food Insecurity: Not on file  ?Transportation Needs: Not on file  ?Physical Activity: Not on file  ?Stress: Not on file  ?Social Connections: Not on file  ?Intimate Partner Violence: Not on file  ? ? ?Allergies: ?Allergies  ?Allergen Reactions  ? Lac Bovis Nausea And Vomiting  ?     ? Doxycycline Nausea Only  ?  Hot flashes, vivid dreams  ? Milk-Related Compounds Nausea And Vomiting  ?     ? Sweetness Enhancer   ?  headaches  ? Azithromycin Rash  ? ? ?Current Medications: ?Current Outpatient Medications  ?Medication Sig Dispense Refill  ? acetaminophen (TYLENOL) 325 MG tablet Take 650 mg by mouth every 6 (six) hours as needed for moderate pain.    ?  Ascorbic Acid (VITAMIN C) 1000 MG tablet Take 1,000 mg by mouth every evening.     ? Cholecalciferol 25 MCG (1000 UT) tablet Take 1,000 Units by mouth every evening.     ? cyclobenzaprine (FLEXERIL) 5 MG tablet Take 1 tablet (5 mg total) by mouth 3 (three) times daily as needed for muscle spasms. 30 tablet 0  ? meloxicam (MOBIC) 15 MG tablet Take 1 tablet (15 mg total) by mouth daily. 15 tablet 0  ? sertraline (ZOLOFT) 100 MG tablet TAKE 1 TABLET BY MOUTH EVERY DAY (Patient taking differently: Take 150 mg by mouth at bedtime.) 90 tablet 1  ? vitamin B-12 (CYANOCOBALAMIN) 500 MCG tablet Take 500 mcg by mouth every evening.     ? zinc gluconate 50 MG tablet Take 50 mg by mouth every evening.     ? estradiol (ESTRACE) 0.5 MG tablet TAKE 2 TABLETS BY MOUTH DAILY. (Patient not taking: Reported on 10/28/2021) 180 tablet 2  ? ferrous sulfate 325 (65 FE) MG tablet Take 325 mg by mouth every evening.  (Patient not taking: Reported on 10/28/2021)    ? fluticasone (FLONASE) 50 MCG/ACT nasal spray Place 1 spray into both nostrils 2 (two) times daily. (Patient not taking: Reported on 10/22/2020) 16 g 6  ? ibuprofen (ADVIL) 800 MG tablet Take 1 tablet (800 mg  total) by mouth every 8 (eight) hours as needed for mild pain or moderate pain (Take with food). (Patient not taking: Reported on 10/22/2020) 90 tablet 0  ? mirabegron ER (MYRBETRIQ) 25 MG TB24 tablet Take 1 t

## 2021-10-28 NOTE — Patient Instructions (Signed)
Please contact [facility] to schedule appointment with a provider for pelvic exam in 6 months. We will see you back in a year. If you have concerning symptoms sooner, please let me know so that we can see you.  ?- Beckey Rutter, NP ?

## 2022-04-09 ENCOUNTER — Other Ambulatory Visit: Payer: Self-pay | Admitting: Infectious Diseases

## 2022-04-09 DIAGNOSIS — Z1231 Encounter for screening mammogram for malignant neoplasm of breast: Secondary | ICD-10-CM

## 2022-12-22 ENCOUNTER — Inpatient Hospital Stay: Payer: BC Managed Care – PPO

## 2023-02-23 ENCOUNTER — Inpatient Hospital Stay: Payer: BC Managed Care – PPO

## 2023-03-30 ENCOUNTER — Inpatient Hospital Stay: Payer: BC Managed Care – PPO | Attending: Obstetrics and Gynecology | Admitting: Obstetrics and Gynecology

## 2023-03-30 VITALS — BP 143/96 | HR 95 | Temp 97.6°F | Resp 20 | Wt 233.9 lb

## 2023-03-30 DIAGNOSIS — Z90710 Acquired absence of both cervix and uterus: Secondary | ICD-10-CM | POA: Diagnosis not present

## 2023-03-30 DIAGNOSIS — Z8542 Personal history of malignant neoplasm of other parts of uterus: Secondary | ICD-10-CM

## 2023-03-30 DIAGNOSIS — Z90722 Acquired absence of ovaries, bilateral: Secondary | ICD-10-CM | POA: Insufficient documentation

## 2023-03-30 DIAGNOSIS — C541 Malignant neoplasm of endometrium: Secondary | ICD-10-CM

## 2023-03-30 NOTE — Progress Notes (Signed)
Gynecologic Oncology Interval Visit   Referring Provider: Althea Grimmer, PA  Chief Complaint: Endometrioid Carcinoma  Subjective:  ASHAE BEATTY is a 57 y.o. female who is seen in consultation from [facility], [place] for endometrioid carcinoma.   She presents for surveillance. No new gyn complaints. She also had a hard time with depression.  She has been on anti-depressants for a few years. Since her surgery she felt that the menopausal symptoms worsened her depression. We did a trial of estradiol and that did not help, so discontinued.  She sees Dr. Apolinar Junes, Urology and was started on Myrbetriq 25 mg for mixed urinary incontinence, OAB. She has no complaints of bladder issues today.   Gynecologic Oncology History:  DANECIA ROULETTE is a pleasant female who is seen in consultation from [facility], [place] for endometrioid carcinoma. She initially presented for AUB s/p endometrial biopsy on 01/18/20. Please refer to prior notes for complete detail.   On 02/20/2020 she underwent Exam under anesthesia, Robotic assisted total hysterectomy and bilateral salpingo-oophorectomy with sentinel node injection, mapping, and biopsies.  - ENDOMETRIAL ADENOCARCINOMA, ENDOMETRIOID TYPE, FIGO GRADE I.  Tumor Size: Greatest dimension: 0.6 cm  Histologic Type: Endometrioid carcinoma, NOS  Histologic Grade: FIGO grade 1  Myometrial Invasion: Not identified  Lymphovascular Invasion: Not identified  SENTINEL LYMPH NODEs, RIGHT OBTURATOR and LEFT OBTURATOR; EXCISIONAL BIOPSY:  NEGATIVE FOR MALIGNANCY (0/2).   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   Abdominal pain    BMI 40.0-44.9, adult (HCC) 02/19/2019   Encounter for screening mammogram for breast cancer    Acute gastritis without hemorrhage    S/P bariatric surgery 04/25/2017   Vitamin B12 nutritional deficiency 04/25/2017   Obesity (BMI 30-39.9) 04/25/2017   Normocytic anemia 04/25/2017   Hyperlipidemia 04/25/2017   Abnormal mammogram 02/01/2017   PMDD (premenstrual dysphoric disorder) 10/21/2015   Vitamin D deficiency 10/21/2015   Degeneration of intervertebral disc of lumbar region 12/23/2014   Calculus of kidney 04/29/2014   Dyspnea 12/11/2013   Preop cardiovascular exam 12/11/2013   L-S radiculopathy 11/04/2011    Past Medical History: Past Medical History:  Diagnosis Date   Anemia    Anxiety    Breast cyst    Degenerative disc disease, lumbar    Depression    Endometrial cancer (HCC)    History of mammogram 05/2012; 09/24/2014   wnl per pt; benign   History of Papanicolaou smear of cervix 08/15/2012   -/-   Hyperlipemia    Vitamin D deficiency     Past Surgical History: Past Surgical History:  Procedure Laterality Date   BACK SURGERY  2015   L5   BREAST BIOPSY Left 2016   NEG   COLONOSCOPY WITH PROPOFOL N/A 04/27/2018   Procedure: COLONOSCOPY WITH BIOPSIES;  Surgeon: Midge Minium, MD;  Location: [facility];  Service: Endoscopy;  Laterality: N/A;   ESOPHAGOGASTRODUODENOSCOPY (EGD) WITH PROPOFOL N/A 04/27/2018   Procedure: ESOPHAGOGASTRODUODENOSCOPY (EGD) WITH BIOPSIES;  Surgeon: Midge Minium, MD;  Location: [facility];  Service: Endoscopy;  Laterality: N/A;   ESOPHAGOGASTRODUODENOSCOPY (EGD) WITH PROPOFOL N/A 06/29/2019   Procedure: ESOPHAGOGASTRODUODENOSCOPY (EGD) WITH PROPOFOL;  Surgeon: Midge Minium, MD;   Location: [facility];  Service: Endoscopy;  Laterality: N/A;   GASTRIC BYPASS  03/2014   gastric sleeve.    POLYPECTOMY N/A 04/27/2018   Procedure: POLYPECTOMY;  Surgeon: Midge Minium, MD;  Location: [facility];  Service: Endoscopy;  Laterality: N/A;   ROBOTIC ASSISTED TOTAL HYSTERECTOMY WITH BILATERAL SALPINGO OOPHERECTOMY N/A 02/20/2020   Procedure: XI ROBOTIC ASSISTED TOTAL HYSTERECTOMY WITH BILATERAL SALPINGO OOPHORECTOMY;  Surgeon: Artelia Laroche, MD;  Location: [facility];  Service: Gynecology;  Laterality: N/A;   SENTINEL NODE BIOPSY N/A 02/20/2020   Procedure: SENTINEL NODE DISSECTION;  Surgeon: Artelia Laroche, MD;  Location: [facility];  Service: Gynecology;  Laterality: N/A;   TOTAL VAGINAL HYSTERECTOMY      Past Gynecologic History:  Menarche: age 11 Pain with menses  OB History:  OB History  Gravida Para Term Preterm AB Living  4 3 3   1 3   SAB IAB Ectopic Multiple Live Births  1       3    # Outcome Date GA Lbr Len/2nd Weight Sex Type Anes PTL Lv  4 Term 07/07/92          3 Term 04/21/89          2 SAB 07/13/87          1 Term 11/20/85            Obstetric Comments  1st Menstrual Cycle:  14  1st Pregnancy:  19    Family History: h/o VTE in her daughter associated with leg fracture and immobility.  Family History  Problem Relation Age of Onset   Breast cancer Mother 33   Hypertension Father    Kidney disease Father    Diabetes Father        type 2   Heart disease Father     Social History: Social History   Socioeconomic History   Marital status: Married    Spouse name: Not on file   Number of children: Not on file   Years of education: 16   Highest education level: Not on file  Occupational History   Occupation: Runner, broadcasting/film/video  Tobacco Use   Smoking status: Never   Smokeless tobacco: Never  Vaping Use   Vaping status: Never Used  Substance and Sexual Activity   Alcohol use: No   Drug use: No   Sexual activity: Not Currently     Birth control/protection: Surgical, Other-see comments    Comment: vasectomy  Other Topics Concern   Not on file  Social History Narrative   Not on file   Social Determinants of Health   Financial Resource Strain: Not on file  Food Insecurity: Not on file  Transportation Needs: Not on file  Physical Activity: Not on file  Stress: Not on file  Social Connections: Not on file  Intimate Partner Violence: Not on file    Allergies: Allergies  Allergen Reactions   Milk (Cow) Nausea And Vomiting        Doxycycline Nausea Only    Hot flashes, vivid dreams   Milk-Related Compounds Nausea And Vomiting        Sweetness Enhancer     headaches   Azithromycin Rash    Current Medications: Current Outpatient Medications  Medication Sig Dispense Refill   acetaminophen (TYLENOL) 325 MG tablet Take 650  mg by mouth every 6 (six) hours as needed for moderate pain.     Ascorbic Acid (VITAMIN C) 1000 MG tablet Take 1,000 mg by mouth every evening.      Cholecalciferol 25 MCG (1000 UT) tablet Take 1,000 Units by mouth every evening.      ciprofloxacin-dexamethasone (CIPRODEX) OTIC suspension SMARTSIG:4 Drop(s) Left Ear Twice Daily     cyclobenzaprine (FLEXERIL) 5 MG tablet Take 1 tablet (5 mg total) by mouth 3 (three) times daily as needed for muscle spasms. 30 tablet 0   meloxicam (MOBIC) 15 MG tablet Take 1 tablet (15 mg total) by mouth daily. 15 tablet 0   sertraline (ZOLOFT) 100 MG tablet TAKE 1 TABLET BY MOUTH EVERY DAY (Patient taking differently: Take 150 mg by mouth at bedtime.) 90 tablet 1   vitamin B-12 (CYANOCOBALAMIN) 500 MCG tablet Take 500 mcg by mouth every evening.      zinc gluconate 50 MG tablet Take 50 mg by mouth every evening.      estradiol (ESTRACE) 0.5 MG tablet TAKE 2 TABLETS BY MOUTH DAILY. (Patient not taking: Reported on 10/28/2021) 180 tablet 2   ferrous sulfate 325 (65 FE) MG tablet Take 325 mg by mouth every evening.  (Patient not taking: Reported on 10/28/2021)      fluticasone (FLONASE) 50 MCG/ACT nasal spray Place 1 spray into both nostrils 2 (two) times daily. (Patient not taking: Reported on 10/22/2020) 16 g 6   ibuprofen (ADVIL) 800 MG tablet Take 1 tablet (800 mg total) by mouth every 8 (eight) hours as needed for mild pain or moderate pain (Take with food). (Patient not taking: Reported on 10/22/2020) 90 tablet 0   No current facility-administered medications for this visit.   Review of Systems General: doing well  HEENT: no complaints  Lungs: no complaints  Cardiac: no complaints  GI: no complaints  GU: no complaints  Musculoskeletal: back pain  Extremities: no complaints  Skin: no complaints  Neuro: finger tingling  Endocrine: no complaints  Psych: feeling sad sometimes       Objective:  Physical Examination:  Body mass index is 45.68 kg/m. BP (!) 143/96   Pulse 95   Temp 97.6 F (36.4 C)   Resp 20   Wt 233 lb 14.4 oz (106.1 kg)   LMP 02/12/2020   SpO2 97%   BMI 45.68 kg/m     ECOG Performance Status: 0 - Asymptomatic  GENERAL: Patient is a well appearing female in no acute distress HEENT:  PERRL, neck supple with midline trachea.  NODES:  No cervical, supraclavicular, axillary, or inguinal lymphadenopathy palpated.  LUNGS:  Clear to auscultation bilaterally.   HEART:  Regular rate and rhythm.  ABDOMEN:  Soft, nontender, nondistended. No ascites or masses. Incisions all healed and no hernias. MSK:  No focal spinal tenderness to palpation. Full range of motion bilaterally in the upper extremities. EXTREMITIES:  No peripheral edema.   NEURO:  Nonfocal. Well oriented.  Appropriate affect.  Pelvic: EGBUS: no lesions Cervix: absent Vagina: no lesions, no discharge or bleeding Uterus: absent BME: no palpable masses or nodularity Rectovaginal: deferred  Lab Review Labs on site today: n/a  Radiologic Imaging: n/a    Assessment:  KEYANAH OVER is a 57 y.o. female diagnosed with stage Ia grade 1 endometrioid  endometrial cancer. On 02/20/2020 she underwent robotic assisted total hysterectomy and bilateral salpingo-oophorectomy with sentinel node injection, mapping, and biopsies for noninvasive grade 1 endometrial cancer.  MMR IHC intact.   Clinically  NED  Urinary incontinence, suspect stress incontinence, controlled with Myrbetriq.   Menopausal symptoms better.  Tried estradiol to see if this would help depression, but did not so stopped this.   Back pain and neuropathy.   Medical co-morbidities complicating care: prior surgery. Body mass index is 45.68 kg/m.  Plan:   Problem List Items Addressed This Visit       Genitourinary   Endometrial cancer (HCC) - Primary    Offered to see her back in one year, but risk of recurrence very low.  She would like to just come back in the future if new concerning symptoms.   In the past we provided patient education for obesity, lifestyle, exercise, nutrition, sexual health, vaginal lubricants. We discussed her weight and need for weight loss, stressed good nutrition, and exercise.   She will follow up with Dr Apolinar Junes regarding Urology issues; and her PCP for her other medical issues.   [name] diagnosis, an outline of the further diagnostic and laboratory studies which will be required, the recommendation for surgery, and alternatives were discussed with her and her accompanying family members.  All questions were answered to their satisfaction.  Leida Lauth, MD

## 2023-05-26 ENCOUNTER — Encounter (HOSPITAL_COMMUNITY): Payer: Self-pay

## 2023-05-26 ENCOUNTER — Ambulatory Visit (HOSPITAL_COMMUNITY)
Admission: EM | Admit: 2023-05-26 | Discharge: 2023-05-26 | Disposition: A | Discharge status: Home / Self Care | Payer: BC Managed Care – PPO | Attending: Family Medicine | Admitting: Family Medicine

## 2023-05-26 ENCOUNTER — Ambulatory Visit (HOSPITAL_COMMUNITY): Payer: BC Managed Care – PPO

## 2023-05-26 DIAGNOSIS — J069 Acute upper respiratory infection, unspecified: Secondary | ICD-10-CM

## 2023-05-26 MED ORDER — PSEUDOEPHEDRINE-GUAIFENESIN ER 120-1200 MG PO TB12: 1.0000 | ORAL_TABLET | Freq: Two times a day (BID) | ORAL | 0 refills | Status: AC

## 2023-05-26 MED ORDER — METHYLPREDNISOLONE 4 MG PO TBPK: ORAL_TABLET | ORAL | 0 refills | Status: AC

## 2023-05-26 NOTE — ED Provider Notes (Signed)
[facility]    CSN: [id number] Arrival date & time: 05/26/23  1237      History   Chief Complaint Chief Complaint  Patient presents with   Cough   Fatigue   Nasal Congestion   Abdominal Pain   Generalized Body Aches    HPI Susan Strickland is a 57 y.o. female.    Cough Associated symptoms: chills, shortness of breath and sore throat   Associated symptoms: no rhinorrhea   Abdominal Pain Associated symptoms: chills, cough, fatigue, nausea, shortness of breath and sore throat    Patient is here for chest congestion, cough, sob, body aches.  This started about 4 days ago.   No runny nose, congestion or drainage.  She did start otc medications but it worsened in terms of the chest congestion.  She feels hot/cold, but did not check her temp.  She is fatigued.   Some nausea with trying to get the phlegm out of her chest.  No h/o asthma or copd.       Past Medical History:  Diagnosis Date   Anemia    Anxiety    Breast cyst    Degenerative disc disease, lumbar    Depression    Endometrial cancer (HCC)    History of mammogram 05/2012; 09/24/2014   wnl per pt; benign   History of Papanicolaou smear of cervix 08/15/2012   -/-   Hyperlipemia    Vitamin D deficiency     Patient Active Problem List   Diagnosis Date Noted   Morbid obesity (HCC) 02/11/2021   S/P laparoscopic hysterectomy 03/03/2020   Menopause 03/03/2020   Endometrial cancer (HCC) 01/30/2020   Urinary incontinence 01/30/2020   Breast mass, right 12/11/2019   Chronic bilateral low back pain with left-sided sciatica 11/28/2019   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   Abdominal pain    BMI 40.0-44.9, adult (HCC) 02/19/2019   Encounter for screening mammogram for breast cancer    Acute gastritis without hemorrhage    S/P bariatric surgery 04/25/2017   Vitamin B12 nutritional deficiency 04/25/2017   Obesity (BMI 30-39.9) 04/25/2017   Normocytic anemia 04/25/2017    Hyperlipidemia 04/25/2017   Abnormal mammogram 02/01/2017   PMDD (premenstrual dysphoric disorder) 10/21/2015   Vitamin D deficiency 10/21/2015   Degeneration of intervertebral disc of lumbar region 12/23/2014   Calculus of kidney 04/29/2014   Dyspnea 12/11/2013   Preop cardiovascular exam 12/11/2013   L-S radiculopathy 11/04/2011    Past Surgical History:  Procedure Laterality Date   BACK SURGERY  2015   L5   BREAST BIOPSY Left 2016   NEG   COLONOSCOPY WITH PROPOFOL N/A 04/27/2018   Procedure: COLONOSCOPY WITH BIOPSIES;  Surgeon: Midge Minium, MD;  Location: [facility];  Service: Endoscopy;  Laterality: N/A;   ESOPHAGOGASTRODUODENOSCOPY (EGD) WITH PROPOFOL N/A 04/27/2018   Procedure: ESOPHAGOGASTRODUODENOSCOPY (EGD) WITH BIOPSIES;  Surgeon: Midge Minium, MD;  Location: [facility];  Service: Endoscopy;  Laterality: N/A;   ESOPHAGOGASTRODUODENOSCOPY (EGD) WITH PROPOFOL N/A 06/29/2019   Procedure: ESOPHAGOGASTRODUODENOSCOPY (EGD) WITH PROPOFOL;  Surgeon: Midge Minium, MD;  Location: [facility];  Service: Endoscopy;  Laterality: N/A;   GASTRIC BYPASS  03/2014   gastric sleeve.    POLYPECTOMY N/A 04/27/2018   Procedure: POLYPECTOMY;  Surgeon: Midge Minium, MD;  Location: [facility];  Service: Endoscopy;  Laterality: N/A;   ROBOTIC ASSISTED TOTAL HYSTERECTOMY WITH BILATERAL SALPINGO OOPHERECTOMY N/A 02/20/2020   Procedure: XI ROBOTIC ASSISTED TOTAL HYSTERECTOMY WITH BILATERAL SALPINGO OOPHORECTOMY;  Surgeon: Artelia Laroche, MD;  Location: [facility];  Service: Gynecology;  Laterality: N/A;   SENTINEL NODE BIOPSY N/A 02/20/2020   Procedure: SENTINEL NODE DISSECTION;  Surgeon: Artelia Laroche, MD;  Location: [facility];  Service: Gynecology;  Laterality: N/A;   TOTAL VAGINAL HYSTERECTOMY      OB History     Gravida  4   Para  3   Term  3   Preterm      AB  1   Living  3      SAB  1   IAB      Ectopic      Multiple      Live  Births  3        Obstetric Comments  1st Menstrual Cycle:  14 1st Pregnancy:  19           Home Medications    Prior to Admission medications   Medication Sig Start Date End Date Taking? Authorizing Provider  acetaminophen (TYLENOL) 325 MG tablet Take 650 mg by mouth every 6 (six) hours as needed for moderate pain.    [provider]  Ascorbic Acid (VITAMIN C) 1000 MG tablet Take 1,000 mg by mouth every evening.     [provider]  Cholecalciferol 25 MCG (1000 UT) tablet Take 1,000 Units by mouth every evening.     [provider]  ciprofloxacin-dexamethasone (CIPRODEX) OTIC suspension SMARTSIG:4 Drop(s) Left Ear Twice Daily 10/05/21   [provider]  cyclobenzaprine (FLEXERIL) 5 MG tablet Take 1 tablet (5 mg total) by mouth 3 (three) times daily as needed for muscle spasms. 02/05/20   Alinda Dooms, NP  estradiol (ESTRACE) 0.5 MG tablet TAKE 2 TABLETS BY MOUTH DAILY. Patient not taking: Reported on 10/28/2021 12/07/20   Alinda Dooms, NP  ferrous sulfate 325 (65 FE) MG tablet Take 325 mg by mouth every evening.  Patient not taking: Reported on 10/28/2021    [provider]  fluticasone (FLONASE) 50 MCG/ACT nasal spray Place 1 spray into both nostrils 2 (two) times daily. Patient not taking: Reported on 10/22/2020 09/28/18   Withrow, Everardo All, FNP  ibuprofen (ADVIL) 800 MG tablet Take 1 tablet (800 mg total) by mouth every 8 (eight) hours as needed for mild pain or moderate pain (Take with food). Patient not taking: Reported on 10/22/2020 04/02/20   Alinda Dooms, NP  meloxicam (MOBIC) 15 MG tablet Take 1 tablet (15 mg total) by mouth daily. 08/31/21   Waldon Merl, PA-C  sertraline (ZOLOFT) 100 MG tablet TAKE 1 TABLET BY MOUTH EVERY DAY Patient taking differently: Take 150 mg by mouth at bedtime. 06/05/19   Karamalegos, Netta Neat, DO  vitamin B-12 (CYANOCOBALAMIN) 500 MCG tablet Take 500 mcg by mouth every evening.     [provider]  zinc gluconate 50 MG tablet Take 50 mg by mouth every evening.     [provider]    Family History Family History  Problem Relation Age of Onset   Breast cancer Mother 47   Hypertension Father    Kidney disease Father    Diabetes Father        type 2   Heart disease Father     Social History Social History   Tobacco Use   Smoking status: Never   Smokeless tobacco: Never  Vaping Use   Vaping status: Never Used  Substance Use Topics   Alcohol use: No   Drug use: No  Allergies   Milk (cow), Doxycycline, Milk-related compounds, Sweetness enhancer, and Azithromycin   Review of Systems Review of Systems  Constitutional:  Positive for chills and fatigue.  HENT:  Positive for sore throat. Negative for congestion and rhinorrhea.   Respiratory:  Positive for cough and shortness of breath.   Gastrointestinal:  Positive for abdominal pain and nausea.  Musculoskeletal: Negative.   Skin: Negative.   Psychiatric/Behavioral: Negative.       Physical Exam Triage Vital Signs ED Triage Vitals [05/26/23 1252]  Encounter Vitals Group     BP (!) 150/84     Systolic BP Percentile      Diastolic BP Percentile      Pulse Rate 72     Resp 16     Temp 98 F (36.7 C)     Temp Source Oral     SpO2 95 %     Weight      Height      Head Circumference      Peak Flow      Pain Score 8     Pain Loc      Pain Education      Exclude from Growth Chart    No data found.  Updated Vital Signs BP (!) 150/84 (BP Location: Left Arm)   Pulse 72   Temp 98 F (36.7 C) (Oral)   Resp 16   LMP 02/12/2020   SpO2 95%   Visual Acuity Right Eye Distance:   Left Eye Distance:   Bilateral Distance:    Right Eye Near:   Left Eye Near:    Bilateral Near:     Physical Exam Constitutional:      General: She is not in acute distress.    Appearance: She is well-developed. She is not ill-appearing.  HENT:     Nose: No congestion or rhinorrhea.      Mouth/Throat:     Mouth: Mucous membranes are moist.     Pharynx: No oropharyngeal exudate.  Cardiovascular:     Rate and Rhythm: Normal rate and regular rhythm.  Pulmonary:     Effort: Pulmonary effort is normal.     Breath sounds: Normal breath sounds.  Musculoskeletal:     Cervical back: Normal range of motion and neck supple. Tenderness present.  Skin:    General: Skin is warm.  Neurological:     General: No focal deficit present.     Mental Status: She is alert.  Psychiatric:        Mood and Affect: Mood normal.     UC Treatments / Results  Labs (all labs ordered are listed, but only abnormal results are displayed) Labs Reviewed - No data to display  EKG   Radiology No results found.  Procedures Procedures (including critical care time)  Medications Ordered in UC Medications - No data to display  Initial Impression / Assessment and Plan / UC Course  I have reviewed the triage vital signs and the nursing notes.  Pertinent labs & imaging results that were available during my care of the patient were reviewed by me and considered in my medical decision making (see chart for details).    Final Clinical Impressions(s) / UC Diagnoses   Final diagnoses:  Upper respiratory tract infection, unspecified type     Discharge Instructions      You were seen today for upper respiratory symptoms.  Your chest xray does not show obvious pneumonia, but if the radiologist reads this differently we will  call to notify you for treatment.  In the mean time I am treating you a viral upper respiratory infection/bronchitis.  I have sent out a steroid pack and a medication to help dry the cough.  If you have worsening symptoms or not improving by next week then please return for re-evaluation.     ED Prescriptions     Medication Sig Dispense Auth. Provider   methylPREDNISolone (MEDROL DOSEPAK) 4 MG TBPK tablet Take as directed 1 each Jaston Havens, MD    Pseudoephedrine-Guaifenesin (MUCINEX D MAX STRENGTH) [phone] MG TB12 Take 1 tablet by mouth in the morning and at bedtime for 10 days. 20 tablet Jannifer Franklin, MD      PDMP not reviewed this encounter.   Jannifer Franklin, MD 05/26/23 1340

## 2023-05-26 NOTE — ED Triage Notes (Signed)
Patient c/o a productive cough with dark yellow sputum and states she has a hard time coughing it up, fatigue, generalized aches, abdominal pain, and nasal congestion x 3 days.  Patient state she has been taking Mucinex Sever Cold and then Dayquil.

## 2023-05-26 NOTE — Discharge Instructions (Addendum)
You were seen today for upper respiratory symptoms.  Your chest xray does not show obvious pneumonia, but if the radiologist reads this differently we will call to notify you for treatment.  In the mean time I am treating you a viral upper respiratory infection/bronchitis.  I have sent out a steroid pack and a medication to help dry the cough.  If you have worsening symptoms or not improving by next week then please return for re-evaluation.

## 2023-07-13 ENCOUNTER — Emergency Department (HOSPITAL_BASED_OUTPATIENT_CLINIC_OR_DEPARTMENT_OTHER): Payer: 59 | Admitting: Radiology

## 2023-07-13 ENCOUNTER — Encounter (HOSPITAL_BASED_OUTPATIENT_CLINIC_OR_DEPARTMENT_OTHER): Payer: Self-pay | Admitting: Emergency Medicine

## 2023-07-13 ENCOUNTER — Other Ambulatory Visit: Payer: Self-pay

## 2023-07-13 DIAGNOSIS — R110 Nausea: Secondary | ICD-10-CM | POA: Diagnosis not present

## 2023-07-13 DIAGNOSIS — R079 Chest pain, unspecified: Secondary | ICD-10-CM | POA: Diagnosis not present

## 2023-07-13 DIAGNOSIS — R519 Headache, unspecified: Secondary | ICD-10-CM | POA: Diagnosis present

## 2023-07-13 DIAGNOSIS — Z5321 Procedure and treatment not carried out due to patient leaving prior to being seen by health care provider: Secondary | ICD-10-CM | POA: Diagnosis not present

## 2023-07-13 DIAGNOSIS — M542 Cervicalgia: Secondary | ICD-10-CM | POA: Diagnosis not present

## 2023-07-13 DIAGNOSIS — R070 Pain in throat: Secondary | ICD-10-CM | POA: Diagnosis not present

## 2023-07-13 DIAGNOSIS — M791 Myalgia, unspecified site: Secondary | ICD-10-CM | POA: Diagnosis not present

## 2023-07-13 DIAGNOSIS — M79602 Pain in left arm: Secondary | ICD-10-CM | POA: Diagnosis not present

## 2023-07-13 LAB — TROPONIN I (HIGH SENSITIVITY)
Troponin I (High Sensitivity): 2 ng/L (ref <18)
Troponin I (High Sensitivity): 2 ng/L (ref <18)

## 2023-07-13 LAB — CBC
HCT: 42.5 % (ref 36.0–46.0)
Hemoglobin: 14 g/dL (ref 12.0–15.0)
MCH: 28.5 pg (ref 26.0–34.0)
MCHC: 32.9 g/dL (ref 30.0–36.0)
MCV: 86.6 fL (ref 80.0–100.0)
Platelets: 213 10*3/uL (ref 150–400)
RBC: 4.91 MIL/uL (ref 3.87–5.11)
RDW: 13.3 % (ref 11.5–15.5)
WBC: 5.4 10*3/uL (ref 4.0–10.5)
nRBC: 0 % (ref 0.0–0.2)

## 2023-07-13 LAB — BASIC METABOLIC PANEL
Anion gap: 8 (ref 5–15)
BUN: 20 mg/dL (ref 6–20)
CO2: 24 mmol/L (ref 22–32)
Calcium: 9.5 mg/dL (ref 8.9–10.3)
Chloride: 108 mmol/L (ref 98–111)
Creatinine, Ser: 0.83 mg/dL (ref 0.44–1.00)
GFR, Estimated: >60 mL/min (ref >60)
Glucose, Bld: 97 mg/dL (ref 70–99)
Potassium: 4.3 mmol/L (ref 3.5–5.1)
Sodium: 140 mmol/L (ref 135–145)

## 2023-07-13 NOTE — ED Triage Notes (Signed)
 Intermittent headaches/ body aches including chest and left arm and left neck.throat. nauseated,no appetite

## 2023-07-14 ENCOUNTER — Emergency Department (HOSPITAL_BASED_OUTPATIENT_CLINIC_OR_DEPARTMENT_OTHER)
Admission: EM | Admit: 2023-07-14 | Discharge: 2023-07-14 | Discharge status: Against Medical Advice | Payer: 59 | Attending: Emergency Medicine | Admitting: Emergency Medicine

## 2023-07-20 ENCOUNTER — Ambulatory Visit: Payer: 59 | Attending: Internal Medicine | Admitting: Internal Medicine

## 2023-07-20 ENCOUNTER — Encounter: Payer: Self-pay | Admitting: Internal Medicine

## 2023-07-20 VITALS — BP 142/90 | HR 83 | Ht 60.0 in | Wt 234.4 lb

## 2023-07-20 DIAGNOSIS — R030 Elevated blood-pressure reading, without diagnosis of hypertension: Secondary | ICD-10-CM

## 2023-07-20 DIAGNOSIS — R072 Precordial pain: Secondary | ICD-10-CM | POA: Diagnosis not present

## 2023-07-20 DIAGNOSIS — Z6841 Body Mass Index (BMI) 40.0 and over, adult: Secondary | ICD-10-CM

## 2023-07-20 DIAGNOSIS — R9431 Abnormal electrocardiogram [ECG] [EKG]: Secondary | ICD-10-CM | POA: Diagnosis not present

## 2023-07-20 NOTE — Patient Instructions (Addendum)
 Medication Instructions:  No changes *If you need a refill on your cardiac medications before your next appointment, please call your pharmacy*   Lab Work: none   Testing/Procedures: Your physician has requested that you have an echocardiogram. Echocardiography is a painless test that uses sound waves to create images of your heart. It provides your doctor with information about the size and shape of your heart and how well your heart's chambers and valves are working. This procedure takes approximately one hour. There are no restrictions for this procedure. Please do NOT wear cologne, perfume, aftershave, or lotions (deodorant is allowed). Please arrive 15 minutes prior to your appointment time.  Please note: We ask at that you not bring children with you during ultrasound (echo/ vascular) testing. Due to room size and safety concerns, children are not allowed in the ultrasound rooms during exams. Our front office staff cannot provide observation of children in our lobby area while testing is being conducted. An adult accompanying a patient to their appointment will only be allowed in the ultrasound room at the discretion of the ultrasound technician under special circumstances. We apologize for any inconvenience.  Coronary CT Angiogram - see instructions below   Follow-Up: As needed  Other Instructions:

## 2023-07-20 NOTE — Progress Notes (Signed)
 Cardiology Office Note:   Date:  07/20/2023  ID:  Susan Strickland, DOB Sep 20, 1965, MRN [id number] PCP:  Susan Alm SQUIBB, MD  [facility] Providers Cardiologist:  Wendel Haws, MD Referring MD: Susan Alm SQUIBB, MD  Chief Complaint/Reason for Referral: Chest pain ASSESSMENT:    1. Precordial pain   2. Body mass index (BMI) of 40.0 to 44.9 in adult (HCC)   3. Nonspecific abnormal electrocardiogram (ECG) (EKG)   4. Elevated blood pressure reading     PLAN:   In order of problems listed above: Arm pain: [name] left arm pain is likely due to a musculoskeletal or neuropathic issue.  Her pain is recapitulated with abduction of the arm.  I think the patient should follow-up with her PCP about potentially an orthopedic issue related to her neck and nerve compression; she agrees with this plan. Elevated BMI: Diet and exercise. Abnormal EKG: Will obtain echocardiogram to evaluate further Blood pressure reading: Likely due to pain.           Dispo:  Return if symptoms worsen or fail to improve.      Medication Adjustments/Labs and Tests Ordered: Current medicines are reviewed at length with the patient today.  Concerns regarding medicines are outlined above.  The following changes have been made:  no change   Labs/tests ordered: Orders Placed This Encounter  Procedures   ECHOCARDIOGRAM COMPLETE    Medication Changes: No orders of the defined types were placed in this encounter.   Current medicines are reviewed at length with the patient today.  The patient does not have concerns regarding medicines.     History of Present Illness:      FOCUSED PROBLEM LIST:   Endometrial carcinoma Followed by OB/GYN Status post robotic total hysterectomy 2021 BMI 46 Depression  1/25: The patient is a 58 year old female with a past medical problems referred to cardiology due to intermittent chest pain.  The patient had been seen in urgent care in November due to  chills, shortness of breath, and a sore throat.  She was treated for URI.  She then presented to [facility] with headaches, body aches, chest and left arm and neck pain as well as nausea.  Her troponins were negative x 2.  Chest x-ray showed no acute cardiopulmonary disease and EKG was reassuring.  She is referred for further evaluation.  The patient tells me that she has developed an atypical chest pain and arm pain.  This can happen at rest and is exacerbated by activity.  Her breathing has been stable.  She denies any other cardiovascular symptoms such as presyncope, syncope, or palpitations.  She does endorse poor sleep however she does not endorse daytime somnolence.  She did have some daytime somnolence and she thinks it was in response to an over-the-counter herbal supplement she was taking.  She no longer takes this supplement.          Current Medications: Current Meds  Medication Sig   acetaminophen  (TYLENOL ) 325 MG tablet Take 650 mg by mouth every 6 (six) hours as needed for moderate pain.   Ascorbic Acid (VITAMIN C) 1000 MG tablet Take 1,000 mg by mouth every evening.    Cholecalciferol 25 MCG (1000 UT) tablet Take 1,000 Units by mouth every evening.    cyclobenzaprine  (FLEXERIL ) 5 MG tablet Take 1 tablet (5 mg total) by mouth 3 (three) times daily as needed for muscle spasms.   estradiol (ESTRACE) 0.5 MG tablet TAKE 2 TABLETS BY MOUTH  DAILY.   ferrous sulfate 325 (65 FE) MG tablet Take 325 mg by mouth every evening.   fluticasone  (FLONASE ) 50 MCG/ACT nasal spray Place 1 spray into both nostrils 2 (two) times daily.   ibuprofen  (ADVIL ) 800 MG tablet Take 1 tablet (800 mg total) by mouth every 8 (eight) hours as needed for mild pain or moderate pain (Take with food).   meloxicam  (MOBIC ) 15 MG tablet Take 1 tablet (15 mg total) by mouth daily.   methylPREDNISolone  (MEDROL  DOSEPAK) 4 MG TBPK tablet Take as directed   sertraline  (ZOLOFT ) 100 MG tablet TAKE 1 TABLET BY  MOUTH EVERY DAY (Patient taking differently: Take 150 mg by mouth at bedtime.)     Review of Systems:   Please see the history of present illness.    All other systems reviewed and are negative.     EKGs/Labs/Other Test Reviewed:   EKG: EKG performed January 2025 that I personally reviewed demonstrates sinus rhythm with anterior infarction pattern  EKG Interpretation Date/Time:    Ventricular Rate:    PR Interval:    QRS Duration:    QT Interval:    QTC Calculation:   R Axis:      Text Interpretation:           Risk Assessment/Calculations:          Physical Exam:   VS:  BP (!) 142/90   Pulse 83   Ht 5' (1.524 m)   Wt 234 lb 6.4 oz (106.3 kg)   LMP 02/12/2020   SpO2 97%   BMI 45.78 kg/m    HYPERTENSION CONTROL Vitals:   07/20/23 1532 07/20/23 1624  BP: (!) 142/90 (!) 142/90    [name] blood pressure is elevated above target today.  In order to address the patient's elevated BP: Blood pressure will be monitored at home to determine if medication changes need to be made.      Wt Readings from Last 3 Encounters:  07/20/23 234 lb 6.4 oz (106.3 kg)  07/13/23 230 lb (104.3 kg)  03/30/23 233 lb 14.4 oz (106.1 kg)      GENERAL:  No apparent distress, AOx3 HEENT:  No carotid bruits, +2 carotid impulses, no scleral icterus CAR: RRR  no murmurs, gallops, rubs, or thrills RES:  Clear to auscultation bilaterally ABD:  Soft, nontender, nondistended, positive bowel sounds x 4 VASC:  +2 radial pulses, +2 carotid pulses NEURO:  CN 2-12 grossly intact; motor and sensory grossly intact PSYCH:  No active depression or anxiety EXT:  No edema, ecchymosis, or cyanosis; shoulder pain on abduction of left arm  Signed, Margaruite Top K Matti Killingsworth, MD  07/20/2023 4:28 PM    [facility] [address] [place], [place], [place]  [address] Phone: [phone]; Fax: [phone]   Note:  This document was prepared using Dragon voice recognition software and may  include unintentional dictation errors.

## 2023-08-09 ENCOUNTER — Ambulatory Visit (HOSPITAL_COMMUNITY): Payer: 59 | Attending: Internal Medicine

## 2023-08-09 ENCOUNTER — Encounter: Payer: Self-pay | Admitting: Internal Medicine

## 2023-08-09 DIAGNOSIS — R9431 Abnormal electrocardiogram [ECG] [EKG]: Secondary | ICD-10-CM | POA: Insufficient documentation

## 2023-08-09 DIAGNOSIS — R072 Precordial pain: Secondary | ICD-10-CM | POA: Insufficient documentation

## 2023-08-09 LAB — ECHOCARDIOGRAM COMPLETE
Area-P 1/2: 3.59 cm2
S' Lateral: 2.7 cm

## 2023-12-08 ENCOUNTER — Telehealth: Payer: Self-pay

## 2023-12-08 NOTE — Telephone Encounter (Signed)
   Pre-operative Risk Assessment    Patient Name: Susan Strickland  DOB: 1965-08-07 MRN: [id number]   Date of last office visit: 07/20/23 Alyssa Backbone, MD Date of next office visit: NONE   Request for Surgical Clearance    Procedure:  LEFT SHOULDER ROTATOR CUFF REPAIR  Date of Surgery:  Clearance TBD                                Surgeon:  DR Orvan Blanch Surgeon's Group or Practice Name:  [facility] Phone number:  [phone] Fax number:  [phone]  ATTN: Avanell Bob STONE   Type of Clearance Requested:   - Medical    Type of Anesthesia:  Not Indicated   Additional requests/questions:    SignedCollin Deal   12/08/2023, 4:28 PM

## 2023-12-08 NOTE — Telephone Encounter (Signed)
   Name: Susan Strickland  DOB: 05-11-1966  MRN: [id number]  Primary Cardiologist: None   Preoperative team, please contact this patient and set up a phone call appointment for further preoperative risk assessment. Please obtain consent and complete medication review. Thank you for your help.  I confirm that guidance regarding antiplatelet and oral anticoagulation therapy has been completed and, if necessary, noted below (none requested).   I also confirmed the patient resides in the state of [place] . As per [facility] Medical Board telemedicine laws, the patient must reside in the state in which the provider is licensed.   Jude Norton, NP 12/08/2023, 4:34 PM [facility]

## 2023-12-08 NOTE — Telephone Encounter (Signed)
 Spoke with patient who is agreeable to do a tele visit on 6/6 at 3 pm. Med rec and consent have been done.

## 2023-12-08 NOTE — Telephone Encounter (Signed)
  Patient Consent for Virtual Visit        Susan Strickland has provided verbal consent on 12/08/2023 for a virtual visit (video or telephone).   CONSENT FOR VIRTUAL VISIT FOR:  Susan Strickland  By participating in this virtual visit I agree to the following:  I hereby voluntarily request, consent and authorize [facility] and its employed or contracted physicians, physician assistants, nurse practitioners or other licensed health care professionals (the Practitioner), to provide me with telemedicine health care services (the "Services") as deemed necessary by the treating Practitioner. I acknowledge and consent to receive the Services by the Practitioner via telemedicine. I understand that the telemedicine visit will involve communicating with the Practitioner through live audiovisual communication technology and the disclosure of certain medical information by electronic transmission. I acknowledge that I have been given the opportunity to request an in-person assessment or other available alternative prior to the telemedicine visit and am voluntarily participating in the telemedicine visit.  I understand that I have the right to withhold or withdraw my consent to the use of telemedicine in the course of my care at any time, without affecting my right to future care or treatment, and that the Practitioner or I may terminate the telemedicine visit at any time. I understand that I have the right to inspect all information obtained and/or recorded in the course of the telemedicine visit and may receive copies of available information for a reasonable fee.  I understand that some of the potential risks of receiving the Services via telemedicine include:  Delay or interruption in medical evaluation due to technological equipment failure or disruption; Information transmitted may not be sufficient (e.g. poor resolution of images) to allow for appropriate medical decision making by the  Practitioner; and/or  In rare instances, security protocols could fail, causing a breach of personal health information.  Furthermore, I acknowledge that it is my responsibility to provide information about my medical history, conditions and care that is complete and accurate to the best of my ability. I acknowledge that Practitioner's advice, recommendations, and/or decision may be based on factors not within their control, such as incomplete or inaccurate data provided by me or distortions of diagnostic images or specimens that may result from electronic transmissions. I understand that the practice of medicine is not an exact science and that Practitioner makes no warranties or guarantees regarding treatment outcomes. I acknowledge that a copy of this consent can be made available to me via my patient portal [facility] MyChart), or I can request a printed copy by calling the office of [facility].    I understand that my insurance will be billed for this visit.   I have read or had this consent read to me. I understand the contents of this consent, which adequately explains the benefits and risks of the Services being provided via telemedicine.  I have been provided ample opportunity to ask questions regarding this consent and the Services and have had my questions answered to my satisfaction. I give my informed consent for the services to be provided through the use of telemedicine in my medical care

## 2023-12-16 ENCOUNTER — Ambulatory Visit: Attending: Cardiology | Admitting: Emergency Medicine

## 2023-12-16 DIAGNOSIS — Z01810 Encounter for preprocedural cardiovascular examination: Secondary | ICD-10-CM | POA: Diagnosis not present

## 2023-12-16 NOTE — Progress Notes (Signed)
 Virtual Visit via Telephone Note   Because of [name] co-morbid illnesses, she is at least at moderate risk for complications without adequate follow up.  This format is felt to be most appropriate for this patient at this time.  Due to technical limitations with video connection Web designer), today's appointment will be conducted as an audio only telehealth visit, and [name] verbally agreed to proceed in this manner.   All issues noted in this document were discussed and addressed.  No physical exam could be performed with this format.  Evaluation Performed:  Preoperative cardiovascular risk assessment _____________   Date:  12/16/2023   Patient ID:  Susan Strickland, DOB 1965/11/18, MRN [id number] Patient Location:  Home Provider location:   Office  Primary Care Provider:  Eartha Gold, MD Primary Cardiologist:  None  Chief Complaint / Patient Profile   58 y.o. y/o female with a h/o precordial pain, obesity, elevated blood pressure reading who is pending left shoulder rotator cuff repair with [facility] on date TBD and presents today for telephonic preoperative cardiovascular risk assessment.  History of Present Illness    Susan Strickland is a 58 y.o. female who presents via audio/video conferencing for a telehealth visit today.  Pt was last seen in cardiology clinic on 07/20/2023 by Dr. Lorie Rook, MD.  At that time Tabitha Ewings was doing well with some complaints of arm pain thought to be musculoskeletal.  The patient is now pending procedure as outlined above. Since her last visit, she denies chest pain, shortness of breath, lower extremity edema, fatigue, palpitations, melena, hematuria, hemoptysis, diaphoresis, weakness, presyncope, syncope, orthopnea, and PND.  Today patient is doing well overall.  She is without any acute cardiovascular concerns or complaints.  She denies any chest pains, exertional symptoms, angina.  There is no indication for  further ischemic evaluation at this time.  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  She does housework and yard work without any symptoms.  She performs yoga and swimming without limitation.  Overall she is easily able to achieve greater than 4 METS.  Past Medical History    Past Medical History:  Diagnosis Date   Anemia    Anxiety    Breast cyst    Degenerative disc disease, lumbar    Depression    Endometrial cancer (HCC)    History of mammogram 05/2012; 09/24/2014   wnl per pt; benign   History of Papanicolaou smear of cervix 08/15/2012   -/-   Hyperlipemia    Vitamin D deficiency    Past Surgical History:  Procedure Laterality Date   BACK SURGERY  2015   L5   BREAST BIOPSY Left 2016   NEG   COLONOSCOPY WITH PROPOFOL  N/A 04/27/2018   Procedure: COLONOSCOPY WITH BIOPSIES;  Surgeon: Marnee Sink, MD;  Location[facility];  Service: Endoscopy;  Laterality: N/A;   ESOPHAGOGASTRODUODENOSCOPY (EGD) WITH PROPOFOL  N/A 04/27/2018   Procedure: ESOPHAGOGASTRODUODENOSCOPY (EGD) WITH BIOPSIES;  Surgeon: Marnee Sink, MD;  Location: [facility];  Service: Endoscopy;  Laterality: N/A;   ESOPHAGOGASTRODUODENOSCOPY (EGD) WITH PROPOFOL  N/A 06/29/2019   Procedure: ESOPHAGOGASTRODUODENOSCOPY (EGD) WITH PROPOFOL ;  Surgeon: Marnee Sink, MD;  Location: [facility];  Service: Endoscopy;  Laterality: N/A;   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    POLYPECTOMY N/A 04/27/2018   Procedure: POLYPECTOMY;  Surgeon: Marnee Sink, MD;  Location: [facility];  Service: Endoscopy;  Laterality: N/A;   ROBOTIC  ASSISTED TOTAL HYSTERECTOMY WITH BILATERAL SALPINGO OOPHERECTOMY N/A 02/20/2020   Procedure: XI ROBOTIC ASSISTED TOTAL HYSTERECTOMY WITH BILATERAL SALPINGO OOPHORECTOMY;  Surgeon: Nobie Batch, MD;  Location: [facility] OR[facility];  Service: Gynecology;  Laterality: N/A;   SENTINEL NODE BIOPSY N/A 02/20/2020   Procedure: SENTINEL NODE DISSECTION;   Surgeon: Nobie Batch, MD;  Location: [facility];  Service: Gynecology;  Laterality: N/A;   TOTAL VAGINAL HYSTERECTOMY      Allergies  Allergies  Allergen Reactions   Milk (Cow) Nausea And Vomiting        Doxycycline  Nausea Only    Hot flashes, vivid dreams   Milk-Related Compounds Nausea And Vomiting        Sweetness Enhancer     headaches   Azithromycin  Rash    Home Medications    Prior to Admission medications   Medication Sig Start Date End Date Taking? Authorizing Provider  acetaminophen  (TYLENOL ) 325 MG tablet Take 650 mg by mouth every 6 (six) hours as needed for moderate pain.    [provider]  Ascorbic Acid (VITAMIN C) 1000 MG tablet Take 1,000 mg by mouth every evening.     [provider]  Cholecalciferol 25 MCG (1000 UT) tablet Take 1,000 Units by mouth every evening.     [provider]  cyclobenzaprine  (FLEXERIL ) 5 MG tablet Take 1 tablet (5 mg total) by mouth 3 (three) times daily as needed for muscle spasms. 02/05/20   Nelda Balsam, NP  estradiol (ESTRACE) 0.5 MG tablet TAKE 2 TABLETS BY MOUTH DAILY. 12/07/20   Nelda Balsam, NP  ferrous sulfate 325 (65 FE) MG tablet Take 325 mg by mouth every evening.    [provider]  fluticasone  (FLONASE ) 50 MCG/ACT nasal spray Place 1 spray into both nostrils 2 (two) times daily. 09/28/18   Withrow, Theadora Fines, FNP  ibuprofen  (ADVIL ) 800 MG tablet Take 1 tablet (800 mg total) by mouth every 8 (eight) hours as needed for mild pain or moderate pain (Take with food). 04/02/20   Nelda Balsam, NP  meloxicam  (MOBIC ) 15 MG tablet Take 1 tablet (15 mg total) by mouth daily. 08/31/21   Farris Hong, PA-C  methylPREDNISolone  (MEDROL  DOSEPAK) 4 MG TBPK tablet Take as directed 05/26/23   Lesle Ras, MD  sertraline  (ZOLOFT ) 100 MG tablet TAKE 1 TABLET BY MOUTH EVERY DAY Patient taking differently: Take 150 mg by mouth at bedtime. 06/05/19   Raina Bunting, DO    Physical  Exam    Vital Signs:  VINAYA SANCHO does not have vital signs available for review today.  Given telephonic nature of communication, physical exam is limited. AAOx3. NAD. Normal affect.  Speech and respirations are unlabored.  Accessory Clinical Findings    None  Assessment & Plan    1.  Preoperative Cardiovascular Risk Assessment: According to the Revised Cardiac Risk Index (RCRI), her Perioperative Risk of Major Cardiac Event is (%): 0.4. Her Functional Capacity in METs is: 9.25 according to the Duke Activity Status Index (DASI). Therefore, based on ACC/AHA guidelines, patient would be at acceptable risk for the planned procedure without further cardiovascular testing.   The patient was advised that if she develops new symptoms prior to surgery to contact our office to arrange for a follow-up visit, and she verbalized understanding.  A copy of this note will be routed to requesting surgeon.  Time:   Today, I have spent 8 minutes with the patient with telehealth technology discussing medical history,  symptoms, and management plan.     Ava Boatman, NP  12/16/2023, 3:16 PM

## 2024-03-07 ENCOUNTER — Emergency Department (HOSPITAL_BASED_OUTPATIENT_CLINIC_OR_DEPARTMENT_OTHER)

## 2024-03-07 ENCOUNTER — Other Ambulatory Visit: Payer: Self-pay

## 2024-03-07 ENCOUNTER — Encounter (HOSPITAL_BASED_OUTPATIENT_CLINIC_OR_DEPARTMENT_OTHER): Payer: Self-pay

## 2024-03-07 ENCOUNTER — Emergency Department (HOSPITAL_BASED_OUTPATIENT_CLINIC_OR_DEPARTMENT_OTHER)
Admission: EM | Admit: 2024-03-07 | Discharge: 2024-03-07 | Disposition: A | Discharge status: Home / Self Care | Source: Ambulatory Visit | Attending: Emergency Medicine | Admitting: Emergency Medicine

## 2024-03-07 DIAGNOSIS — R110 Nausea: Secondary | ICD-10-CM | POA: Insufficient documentation

## 2024-03-07 DIAGNOSIS — R42 Dizziness and giddiness: Secondary | ICD-10-CM | POA: Insufficient documentation

## 2024-03-07 DIAGNOSIS — R519 Headache, unspecified: Secondary | ICD-10-CM | POA: Diagnosis not present

## 2024-03-07 DIAGNOSIS — R531 Weakness: Secondary | ICD-10-CM | POA: Insufficient documentation

## 2024-03-07 LAB — URINALYSIS, ROUTINE W REFLEX MICROSCOPIC
Bacteria, UA: NONE SEEN
Bilirubin Urine: NEGATIVE
Glucose, UA: NEGATIVE mg/dL
Hgb urine dipstick: NEGATIVE
Ketones, ur: NEGATIVE mg/dL
Nitrite: NEGATIVE
Protein, ur: NEGATIVE mg/dL
Specific Gravity, Urine: 1.011 (ref 1.005–1.030)
pH: 7 (ref 5.0–8.0)

## 2024-03-07 LAB — CBC
HCT: 41.9 % (ref 36.0–46.0)
Hemoglobin: 13.9 g/dL (ref 12.0–15.0)
MCH: 28.8 pg (ref 26.0–34.0)
MCHC: 33.2 g/dL (ref 30.0–36.0)
MCV: 86.9 fL (ref 80.0–100.0)
Platelets: 261 K/uL (ref 150–400)
RBC: 4.82 MIL/uL (ref 3.87–5.11)
RDW: 13.5 % (ref 11.5–15.5)
WBC: 7.6 K/uL (ref 4.0–10.5)
nRBC: 0 % (ref 0.0–0.2)

## 2024-03-07 LAB — COMPREHENSIVE METABOLIC PANEL WITH GFR
ALT: 41 U/L (ref 0–44)
AST: 46 U/L — ABNORMAL HIGH (ref 15–41)
Albumin: 4.3 g/dL (ref 3.5–5.0)
Alkaline Phosphatase: 97 U/L (ref 38–126)
Anion gap: 13 (ref 5–15)
BUN: 16 mg/dL (ref 6–20)
CO2: 22 mmol/L (ref 22–32)
Calcium: 9.9 mg/dL (ref 8.9–10.3)
Chloride: 105 mmol/L (ref 98–111)
Creatinine, Ser: 0.81 mg/dL (ref 0.44–1.00)
GFR, Estimated: >60 mL/min (ref >60)
Glucose, Bld: 103 mg/dL — ABNORMAL HIGH (ref 70–99)
Potassium: 4.4 mmol/L (ref 3.5–5.1)
Sodium: 140 mmol/L (ref 135–145)
Total Bilirubin: 0.3 mg/dL (ref 0.0–1.2)
Total Protein: 7.3 g/dL (ref 6.5–8.1)

## 2024-03-07 LAB — D-DIMER, QUANTITATIVE: D-Dimer, Quant: 0.68 ug{FEU}/mL — ABNORMAL HIGH (ref 0.00–0.50)

## 2024-03-07 LAB — LIPASE, BLOOD: Lipase: 48 U/L (ref 11–51)

## 2024-03-07 LAB — TROPONIN T, HIGH SENSITIVITY: Troponin T High Sensitivity: <15 ng/L (ref 0–19)

## 2024-03-07 MED ORDER — ONDANSETRON 4 MG PO TBDP
4.0000 mg | ORAL_TABLET | Freq: Three times a day (TID) | ORAL | 0 refills | Status: AC | PRN
Start: 2024-03-07 — End: ?

## 2024-03-07 MED ORDER — SODIUM CHLORIDE 0.9 % IV BOLUS
1000.0000 mL | Freq: Once | INTRAVENOUS | Status: AC
  Administered 2024-03-07: 1000 mL via INTRAVENOUS

## 2024-03-07 MED ORDER — ONDANSETRON 4 MG PO TBDP: 4.0000 mg | ORAL_TABLET | Freq: Three times a day (TID) | ORAL | 0 refills | Status: DC | PRN

## 2024-03-07 MED ORDER — IOHEXOL 350 MG/ML SOLN
100.0000 mL | Freq: Once | INTRAVENOUS | Status: AC | PRN
  Administered 2024-03-07: 100 mL via INTRAVENOUS

## 2024-03-07 MED ORDER — ONDANSETRON HCL 4 MG/2ML IJ SOLN
4.0000 mg | Freq: Once | INTRAMUSCULAR | Status: AC
  Administered 2024-03-07: 4 mg via INTRAVENOUS
  Filled 2024-03-07: qty 2

## 2024-03-07 NOTE — ED Notes (Signed)
 ED Provider at bedside.

## 2024-03-07 NOTE — Discharge Instructions (Signed)
 Stay well-hydrated.  Follow-up with your primary care doctor.  Return if symptoms worsen.

## 2024-03-07 NOTE — ED Provider Notes (Signed)
 [place] [facility] DEPARTMENT AT [facility] Provider Note   CSN: [id number] Arrival date & time: 03/07/24  1015     Patient presents with: Nausea and Weakness   Susan Strickland is a 58 y.o. female.   Patient here with nausea weakness.  Shoulder surgery about 12 days ago.  Has not done much activity but she has been trying to stay hydrated.  She left the house for an extended time yesterday for the first time since her surgery felt little bit weak and lightheaded today.  Especially when standing up.  She denies any diarrhea.  No chest pain but has felt shortness of breath and weak.  She denies any fever or chills.  She is no longer taking any narcotic pain medicine.  She denies any black or bloody stools.  She denies abdominal pain.  She had a headache earlier today but now resolved.  The history is provided by the patient.       Prior to Admission medications   Medication Sig Start Date End Date Taking? Authorizing Provider  ondansetron  (ZOFRAN -ODT) 4 MG disintegrating tablet Take 1 tablet (4 mg total) by mouth every 8 (eight) hours as needed. 03/07/24  Yes Catheline Hixon, DO  acetaminophen  (TYLENOL ) 325 MG tablet Take 650 mg by mouth every 6 (six) hours as needed for moderate pain.    [provider]  Ascorbic Acid (VITAMIN C) 1000 MG tablet Take 1,000 mg by mouth every evening.     [provider]  Cholecalciferol 25 MCG (1000 UT) tablet Take 1,000 Units by mouth every evening.     [provider]  cyclobenzaprine  (FLEXERIL ) 5 MG tablet Take 1 tablet (5 mg total) by mouth 3 (three) times daily as needed for muscle spasms. 02/05/20   Dasie Tinnie KANDICE, NP  estradiol (ESTRACE) 0.5 MG tablet TAKE 2 TABLETS BY MOUTH DAILY. 12/07/20   Dasie Tinnie KANDICE, NP  ferrous sulfate 325 (65 FE) MG tablet Take 325 mg by mouth every evening.    [provider]  fluticasone  (FLONASE ) 50 MCG/ACT nasal spray Place 1 spray into both nostrils 2 (two) times daily.  09/28/18   Withrow, Norleen BROCKS, FNP  ibuprofen  (ADVIL ) 800 MG tablet Take 1 tablet (800 mg total) by mouth every 8 (eight) hours as needed for mild pain or moderate pain (Take with food). 04/02/20   Dasie Tinnie KANDICE, NP  meloxicam  (MOBIC ) 15 MG tablet Take 1 tablet (15 mg total) by mouth daily. 08/31/21   Gladis Elsie BROCKS, PA-C  methylPREDNISolone  (MEDROL  DOSEPAK) 4 MG TBPK tablet Take as directed 05/26/23   Darral Longs, MD  sertraline  (ZOLOFT ) 100 MG tablet TAKE 1 TABLET BY MOUTH EVERY DAY Patient taking differently: Take 150 mg by mouth at bedtime. 06/05/19   Karamalegos, Marsa PARAS, DO    Allergies: Milk (cow), Doxycycline , Milk-related compounds, Sweetness enhancer, and Azithromycin     Review of Systems  Updated Vital Signs BP (!) 136/94   Pulse 90   Temp 98.4 F (36.9 C) (Oral)   Resp 18   LMP 02/12/2020   SpO2 99%   Physical Exam Vitals and nursing note reviewed.  Constitutional:      General: She is not in acute distress.    Appearance: She is well-developed. She is not ill-appearing.  HENT:     Head: Normocephalic and atraumatic.     Nose: Nose normal.     Mouth/Throat:     Mouth: Mucous membranes are moist.  Eyes:  Conjunctiva/sclera: Conjunctivae normal.     Pupils: Pupils are equal, round, and reactive to light.  Cardiovascular:     Rate and Rhythm: Normal rate and regular rhythm.     Pulses: Normal pulses.     Heart sounds: Normal heart sounds. No murmur heard. Pulmonary:     Effort: Pulmonary effort is normal. No respiratory distress.     Breath sounds: Normal breath sounds.  Abdominal:     General: Abdomen is flat.     Palpations: Abdomen is soft.     Tenderness: There is no abdominal tenderness.  Musculoskeletal:        General: No swelling.     Cervical back: Normal range of motion and neck supple.  Skin:    General: Skin is warm and dry.     Capillary Refill: Capillary refill takes less than 2 seconds.     Comments: Surgical dressing in place in the  left shoulder but surrounding area but no redness  Neurological:     General: No focal deficit present.     Mental Status: She is alert and oriented to person, place, and time.     Cranial Nerves: No cranial nerve deficit.     Sensory: No sensory deficit.     Motor: No weakness.     Coordination: Coordination normal.     Comments: Normal strength and sensation throughout unable to test strength in the left upper extremity due to recent surgery speech is normal  Psychiatric:        Mood and Affect: Mood normal.     (all labs ordered are listed, but only abnormal results are displayed) Labs Reviewed  COMPREHENSIVE METABOLIC PANEL WITH GFR - Abnormal; Notable for the following components:      Result Value   Glucose, Bld 103 (*)    AST 46 (*)    All other components within normal limits  URINALYSIS, ROUTINE W REFLEX MICROSCOPIC - Abnormal; Notable for the following components:   APPearance HAZY (*)    Leukocytes,Ua LARGE (*)    Non Squamous Epithelial 0-5 (*)    All other components within normal limits  D-DIMER, QUANTITATIVE - Abnormal; Notable for the following components:   D-Dimer, Quant 0.68 (*)    All other components within normal limits  LIPASE, BLOOD  CBC  TROPONIN T, HIGH SENSITIVITY    EKG: EKG Interpretation Date/Time:  Wednesday March 07 2024 10:41:57 EDT Ventricular Rate:  89 PR Interval:  135 QRS Duration:  83 QT Interval:  347 QTC Calculation: 423 R Axis:   49  Text Interpretation: Sinus rhythm Low voltage, precordial leads Confirmed by Ruthe Cornet [phone]) on 03/07/2024 10:44:16 AM  Radiology: CT Angio Chest PE W and/or Wo Contrast Result Date: 03/07/2024 CLINICAL DATA:  Shortness of breath for 12 days. EXAM: CT ANGIOGRAPHY CHEST WITH CONTRAST TECHNIQUE: Multidetector CT imaging of the chest was performed using the standard protocol during bolus administration of intravenous contrast. Multiplanar CT image reconstructions and MIPs were obtained to  evaluate the vascular anatomy. RADIATION DOSE REDUCTION: This exam was performed according to the departmental dose-optimization program which includes automated exposure control, adjustment of the mA and/or kV according to patient size and/or use of iterative reconstruction technique. CONTRAST:  OMNIPAQUE  IOHEXOL  350 MG/ML SOLN COMPARISON:  Radiograph of same day. FINDINGS: Cardiovascular: Satisfactory opacification of the pulmonary arteries to the segmental level. No evidence of pulmonary embolism. Normal heart size. No pericardial effusion. Mediastinum/Nodes: No enlarged mediastinal, hilar, or axillary lymph nodes. Thyroid gland,  trachea, and esophagus demonstrate no significant findings. Lungs/Pleura: Lungs are clear. No pleural effusion or pneumothorax. Upper Abdomen: No acute abnormality. Musculoskeletal: No chest wall abnormality. No acute or significant osseous findings. Review of the MIP images confirms the above findings. IMPRESSION: No definite evidence of pulmonary embolus. No definite abnormality seen in the chest. Electronically Signed   By: Lynwood Landy Raddle M.D.   On: 03/07/2024 14:20   DG Chest Portable 1 View Result Date: 03/07/2024 CLINICAL DATA:  Shortness of breath, nausea, 12 days postop shoulder surgery. EXAM: PORTABLE CHEST 1 VIEW COMPARISON:  07/13/2023. FINDINGS: Trachea is midline. Heart size normal. Lungs are somewhat low in volume but clear. No pleural fluid. IMPRESSION: No acute findings. Electronically Signed   By: Newell Eke M.D.   On: 03/07/2024 12:17     Procedures   Medications Ordered in the ED  sodium chloride  0.9 % bolus 1,000 mL (1,000 mLs Intravenous New Bag/Given 03/07/24 1049)  ondansetron  (ZOFRAN ) injection 4 mg (4 mg Intravenous Given 03/07/24 1046)  iohexol  (OMNIPAQUE ) 350 MG/ML injection 100 mL (100 mLs Intravenous Contrast Given 03/07/24 1347)                                    Medical Decision Making Amount and/or Complexity of Data  Reviewed Labs: ordered. Radiology: ordered.  Risk Prescription drug management.   Susan Strickland is here with weakness and nausea since left shoulder surgery about 2 weeks ago.  History of anxiety high cholesterol anemia.  Unremarkable vitals mildly tachycardic.  EKG shows sinus rhythm.  No ischemic changes.  Differential diagnosis could be dehydration, less likely ACS PE infectious process anemia.  She is neurologically intact.  Surgical site appears well.  Sounds like she increased her activity yesterday for the first time since her surgery.  Also patient also lost her family dog recently.  Likely emotional from that she states.  Ultimately will get labs including D-dimer troponin basic labs chest x-ray will give IV fluids and IV Zofran  and reevaluate.  Overall lab work per my review and interpretation unremarkable.  D-dimer elevated but CT scan was unremarkable.  Urinalysis negative for infection.  No significant leukocytosis anemia or electrolyte abnormality otherwise.  Troponin normal.  Overall feeling better after IV fluids.  I suspect generalized weakness still recovering from recent surgery.  Continue hydration at home.  Follow-up with primary care doctor.  Discharge.  Understands return precautions.  I have very low suspicion for acute process at this time.  This chart was dictated using voice recognition software.  Despite best efforts to proofread,  errors can occur which can change the documentation meaning.      Final diagnoses:  Weakness    ED Discharge Orders          Ordered    ondansetron  (ZOFRAN -ODT) 4 MG disintegrating tablet  Every 8 hours PRN        03/07/24 1432               Ruthe Cornet, DO 03/07/24 1433

## 2024-03-07 NOTE — ED Triage Notes (Signed)
 Patient reports nausea 12 days post operative from shoulder surgery. She states she has also felt short of breath and weak since that. He doctor suggested she comes in.

## 2024-03-07 NOTE — ED Notes (Signed)
 Reviewed discharge instructions, medications, and home care with pt. Pt verbalized understanding and had no further questions. Pt exited ED without complications.

## 2024-03-07 NOTE — ED Notes (Signed)
 Patient transported to CT

## 2024-04-06 ENCOUNTER — Telehealth: Payer: Self-pay | Admitting: Infectious Diseases

## 2024-04-06 NOTE — Telephone Encounter (Signed)
 Received referral from Dr. Lequita from [facility] to establish w/ PCP. LVM informing patient she can call back @ [phone] to schedule when ready.

## 2024-05-18 ENCOUNTER — Encounter: Payer: Self-pay | Admitting: Obstetrics and Gynecology
